# Patient Record
Sex: Female | Born: 1975 | Race: White | Hispanic: No | Marital: Single | Smoking: Former smoker
Health system: Southern US, Community
[De-identification: ages and names within clinical notes are randomized; demographics above are authoritative.]

## PROBLEM LIST (undated history)

## (undated) DIAGNOSIS — F319 Bipolar disorder, unspecified: Secondary | ICD-10-CM

## (undated) DIAGNOSIS — T7840XA Allergy, unspecified, initial encounter: Secondary | ICD-10-CM

## (undated) DIAGNOSIS — I341 Nonrheumatic mitral (valve) prolapse: Secondary | ICD-10-CM

## (undated) DIAGNOSIS — R569 Unspecified convulsions: Secondary | ICD-10-CM

## (undated) DIAGNOSIS — K219 Gastro-esophageal reflux disease without esophagitis: Secondary | ICD-10-CM

## (undated) DIAGNOSIS — L719 Rosacea, unspecified: Secondary | ICD-10-CM

## (undated) DIAGNOSIS — F419 Anxiety disorder, unspecified: Secondary | ICD-10-CM

## (undated) DIAGNOSIS — K859 Acute pancreatitis without necrosis or infection, unspecified: Secondary | ICD-10-CM

## (undated) DIAGNOSIS — K863 Pseudocyst of pancreas: Secondary | ICD-10-CM

## (undated) DIAGNOSIS — F1010 Alcohol abuse, uncomplicated: Secondary | ICD-10-CM

## (undated) DIAGNOSIS — I10 Essential (primary) hypertension: Secondary | ICD-10-CM

## (undated) HISTORY — DX: Allergy, unspecified, initial encounter: T78.40XA

## (undated) HISTORY — DX: Rosacea, unspecified: L71.9

## (undated) HISTORY — PX: LEEP: SHX91

## (undated) HISTORY — PX: NASAL SINUS SURGERY: SHX719

## (undated) HISTORY — DX: Anxiety disorder, unspecified: F41.9

## (undated) HISTORY — DX: Gastro-esophageal reflux disease without esophagitis: K21.9

## (undated) HISTORY — DX: Pseudocyst of pancreas: K86.3

---

## 2002-03-29 ENCOUNTER — Emergency Department (HOSPITAL_COMMUNITY): Admission: EM | Admit: 2002-03-29 | Discharge: 2002-03-29 | Payer: Self-pay | Admitting: Emergency Medicine

## 2002-11-08 ENCOUNTER — Encounter: Payer: Self-pay | Admitting: *Deleted

## 2002-11-08 ENCOUNTER — Inpatient Hospital Stay (HOSPITAL_COMMUNITY): Admission: AD | Admit: 2002-11-08 | Discharge: 2002-11-11 | Payer: Self-pay | Admitting: Family Medicine

## 2005-10-25 ENCOUNTER — Ambulatory Visit (HOSPITAL_COMMUNITY): Admission: RE | Admit: 2005-10-25 | Discharge: 2005-10-25 | Payer: Self-pay | Admitting: Internal Medicine

## 2005-11-22 ENCOUNTER — Ambulatory Visit (HOSPITAL_COMMUNITY): Admission: RE | Admit: 2005-11-22 | Discharge: 2005-11-22 | Payer: Self-pay | Admitting: Internal Medicine

## 2006-10-21 ENCOUNTER — Emergency Department (HOSPITAL_COMMUNITY): Admission: EM | Admit: 2006-10-21 | Discharge: 2006-10-21 | Payer: Self-pay | Admitting: Emergency Medicine

## 2007-05-26 ENCOUNTER — Emergency Department (HOSPITAL_COMMUNITY): Admission: EM | Admit: 2007-05-26 | Discharge: 2007-05-26 | Payer: Self-pay | Admitting: Emergency Medicine

## 2007-12-24 DIAGNOSIS — R569 Unspecified convulsions: Secondary | ICD-10-CM

## 2007-12-24 HISTORY — DX: Unspecified convulsions: R56.9

## 2008-08-24 ENCOUNTER — Ambulatory Visit (HOSPITAL_COMMUNITY): Admission: RE | Admit: 2008-08-24 | Discharge: 2008-08-24 | Payer: Self-pay | Admitting: Internal Medicine

## 2011-01-13 ENCOUNTER — Encounter: Payer: Self-pay | Admitting: Internal Medicine

## 2011-01-13 ENCOUNTER — Encounter: Payer: Self-pay | Admitting: Family Medicine

## 2011-05-10 NOTE — H&P (Signed)
NAME:  Brittany [name], Brittany [name]                      ACCOUNT NO.:  [id number]   MEDICAL RECORD NO.:  [id number]                   PATIENT TYPE:  INP   LOCATION:  A320                                 FACILITY:  [facility]   PHYSICIAN:  Gracelyn Nurse, M.D.              DATE OF BIRTH:  06-03-1976   DATE OF ADMISSION:  11/08/2002  DATE OF DISCHARGE:                                HISTORY & PHYSICAL   CHIEF COMPLAINT:  Headache and generalized weakness.   HISTORY OF PRESENT ILLNESS:  This is a 35 year old white female who  presented to her primary M.D.'s office earlier today, complaining of  headache, photophobia and generalized weakness.  She was sent to the  hospital for admission.  She has had a three-day history of sudden onset,  generalized myalgia, fever, chills and headache.  She has been feeling worse  despite using over-the-counter medications.  Her headache is even worse, and  she feels too weak to get out of the bed.  She has had some nausea and  vomiting x1 today.   PAST MEDICAL HISTORY:  Migraine headaches.   ALLERGIES:  No known drug allergies.   CURRENT MEDICATIONS:  Tylenol Cold p.r.n.   SOCIAL HISTORY:  Does not smoke any tobacco; drinks alcohol occasionally.  She is single with three children.   FAMILY HISTORY:  Mother and sister both have migraine headaches.  Father,  she does not know anything about.   REVIEW OF SYSTEMS:  As per HPI.  All other systems reviewed and are normal.   PHYSICAL EXAMINATION:  VITAL SIGNS:  Temperature 101.8, pulse 91,  respirations 20, blood pressure 109/66.  GENERAL:  This is a well-nourished white female who appears sick.  HEENT:  Pupils equal, round and reactive to light.  The extraocular  movements are intact.  Oral mucosa is moist.  Oropharynx is clear.  NECK:  Supple, full range-of-motion.  Able to touch chin to chest and no  lymphadenopathy.  CARDIOVASCULAR:  Regular rate and rhythm, no murmurs.  LUNGS:  Clear to auscultation.  ABDOMEN:  Soft, nontender, nondistended, bowel sounds are positive.  EXTREMITIES:  No edema.  NEUROLOGIC:  Cranial nerves II-XII are grossly intact.  There is no  Brudzinski or Kernig sign.  She has full range-of-motion of her neck.  SKIN:  Moist with no rash.   LABORATORY DATA:  CBC, __________ , and UA are currently pending.    ASSESSMENT AND PLAN:  1. Viral syndrome.  Suspect this is influenza virus.  I did not see any     meningeal signs at this point, so I will not do a lumbar puncture.  I     will give supportive care with IV fluids and emetics, antipyretics and     pain medications.  I will also check influenza nasal swab.  It has been     greater than 48 hours since onset of symptoms, so most of the anti-viral  medications for flu would not be effective if started at this late date.  2. Fever.  I will treat this with acetaminophen and ibuprofen.  3. Headache.  Patient does have a history of migraine headaches, which may     be exacerbated by this illness.  I will continue pain medicines.  I will     try a combination of Compazine and NSAID  which may be helpful.  Will try     using narcotic medications, if necessary.                                               Gracelyn Nurse, M.D.    JDJ/MEDQ  D:  11/08/2002  T:  11/08/2002  Job:  [id number]

## 2011-05-10 NOTE — Procedures (Signed)
[name], Brittany [name] NO.:  [id number]   MEDICAL RECORD NO.:  [id number]          PATIENT TYPE:  OUT   LOCATION:  RAD                           FACILITY:  [facility]   PHYSICIAN:  Dani Gobble, MD       DATE OF BIRTH:  1976-12-19   DATE OF PROCEDURE:  10/25/2005  DATE OF DISCHARGE:  10/25/2005                                  ECHOCARDIOGRAM   INDICATIONS:  A 35 year old female without prior cardiac history referred  for evaluation of possible mitral valve prolapse with chest pain and  palpitations.   Required study is reasonable.   Aorta measures normal at 2.7 cm.   Left atrium also measured normally at 3.2 cm.  No obvious clots or masses  were appreciated.  The patient appeared to be in sinus rhythm during this  procedure.   Intraventricular site and posterior wall were normal and measured at 1.0 cm  each.   The aortic valve was not well visualized but appeared to be grossly normal.  Although, all three leaflets were not well visualized, there are no  secondary sequelae such as aortic insufficiency or eccentric closure to  suggest otherwise.  No significant aortic insufficiency is proceeded.  Doppler interrogation aortic valve is within normal limits.   Mitral valve essentially appears normal, although, it exhibits mild full  atrophy anterior leaflet.  No significant mitral regurgitation is noted.  Doppler interrogation and mitral valve is within normal limits.   Pulmonic valve appears grossly structurally normal.  Tricuspid valve also  appeared grossly normal.  No mild tricuspid regurgitation noted.  Pulmonary  pressure is estimated to be at the upper limits of normal.   The left ventricle is normal in size with the LB IDD measuring 4.1 cm.  LB  ISD measured 2.7 cm.  Overall, left ventricular systolic function was  normal.  No regional wall motion abnormalities were noted.  There was no  suggestion of diastolic dysfunction on this.   The right atrium and  right ventricle were normal in size and right  ventricular systolic function appears to be normal.   IMPRESSION:  1.  Normal left ventricular size and systolic function without regional wall      motion abnormality noted.  2.  Mild prolapse with anterior leads for the mitral valve.  3.  Mild tricuspid regurgitation.  4.  Pulmonary function was estimated to be at the upper limits of normal.  5.  The aortic valve was not well visualized.  However, no secondary      sequelae to suggest otherwise (no AI and no eccentric closure):      Consider transesophageal echocardiogram if enhanced visualization of the      aortic valve is required.           ______________________________  Dani Gobble, MD     AB/MEDQ  D:  10/28/2005  T:  10/29/2005  Job:  [id number]

## 2011-05-10 NOTE — Discharge Summary (Signed)
   NAME:  Brittany [name], Brittany [name]                      ACCOUNT NO.:  [id number]   MEDICAL RECORD NO.:  [id number]                   PATIENT TYPE:  INP   LOCATION:  A320                                 FACILITY:  [facility]   PHYSICIAN:  Scott A. Gerda Diss, M.D.               DATE OF BIRTH:  12/22/1976   DATE OF ADMISSION:  11/08/2002  DATE OF DISCHARGE:  11/11/2002                                 DISCHARGE SUMMARY   DISCHARGE DIAGNOSES:  1. Viral syndrome.  2. Severe headache secondary to #1.  3. Gastroenteritis secondary to #1.   HOSPITAL COURSE:  The patient was admitted having a headache, high fever,  and inability to keep anything down.  There was concern for bacteremia.  The  patient's neck was supple.  During hospitalization, influenza test, type A  and type B, was negative.  A white count was 7.4, hemoglobin 11.5.  Temperature curve gradually came down over the next couple days.  Blood  culture came back on the evening of November 19 as being negative.  On the  morning of November 20, her fever curve was down, her headache was improved  but not 100% well, her neck was supple.  It was felt that the patient was  stable for discharge.  She was discharged to home in good condition.  Instructed to take Vicodin one q.4h. p.r.n. headache, caution drowsiness,  and also she may use Bactroban apply b.i.d. inside of nose as directed.  Follow up in the office if further trouble.  Off work this week.  Call if  problems.                                               Scott A. Gerda Diss, M.D.    SAL/MEDQ  D:  11/11/2002  T:  11/11/2002  Job:  [id number]

## 2011-10-10 LAB — RAPID URINE DRUG SCREEN, HOSP PERFORMED
Amphetamines: NOT DETECTED
Barbiturates: POSITIVE — AB
Benzodiazepines: POSITIVE — AB
Opiates: NOT DETECTED
Tetrahydrocannabinol: NOT DETECTED

## 2011-10-10 LAB — CBC
HCT: 37
Hemoglobin: 12.6
MCHC: 34.2
MCV: 86.7
RBC: 4.26
RDW: 16.7 — ABNORMAL HIGH

## 2011-10-10 LAB — DIFFERENTIAL
Basophils Relative: 1
Eosinophils Absolute: 0.1
Eosinophils Relative: 3
Monocytes Absolute: 0.3
Monocytes Relative: 6
Neutro Abs: 2.1

## 2011-10-10 LAB — BASIC METABOLIC PANEL
BUN: 4 — ABNORMAL LOW
CO2: 26
Chloride: 101
Glucose, Bld: 97
Potassium: 2.8 — ABNORMAL LOW
Sodium: 138

## 2012-04-29 ENCOUNTER — Emergency Department (HOSPITAL_COMMUNITY)
Admission: EM | Admit: 2012-04-29 | Discharge: 2012-05-02 | Disposition: A | Discharge status: Home / Self Care | Payer: 59 | Source: Home / Self Care | Attending: Emergency Medicine | Admitting: Emergency Medicine

## 2012-04-29 ENCOUNTER — Emergency Department (HOSPITAL_COMMUNITY): Payer: 59

## 2012-04-29 ENCOUNTER — Encounter (HOSPITAL_COMMUNITY): Payer: Self-pay | Admitting: *Deleted

## 2012-04-29 DIAGNOSIS — S82839A Other fracture of upper and lower end of unspecified fibula, initial encounter for closed fracture: Secondary | ICD-10-CM

## 2012-04-29 DIAGNOSIS — S82899A Other fracture of unspecified lower leg, initial encounter for closed fracture: Secondary | ICD-10-CM | POA: Insufficient documentation

## 2012-04-29 DIAGNOSIS — W010XXA Fall on same level from slipping, tripping and stumbling without subsequent striking against object, initial encounter: Secondary | ICD-10-CM | POA: Insufficient documentation

## 2012-04-29 DIAGNOSIS — F1010 Alcohol abuse, uncomplicated: Secondary | ICD-10-CM | POA: Insufficient documentation

## 2012-04-29 DIAGNOSIS — Z79899 Other long term (current) drug therapy: Secondary | ICD-10-CM | POA: Insufficient documentation

## 2012-04-29 DIAGNOSIS — F10929 Alcohol use, unspecified with intoxication, unspecified: Secondary | ICD-10-CM

## 2012-04-29 DIAGNOSIS — F319 Bipolar disorder, unspecified: Secondary | ICD-10-CM | POA: Insufficient documentation

## 2012-04-29 DIAGNOSIS — R000 Tachycardia, unspecified: Secondary | ICD-10-CM | POA: Insufficient documentation

## 2012-04-29 HISTORY — DX: Bipolar disorder, unspecified: F31.9

## 2012-04-29 HISTORY — DX: Unspecified convulsions: R56.9

## 2012-04-29 LAB — COMPREHENSIVE METABOLIC PANEL
AST: 34 U/L (ref 0–37)
CO2: 30 mEq/L (ref 19–32)
Calcium: 9.1 mg/dL (ref 8.4–10.5)
Creatinine, Ser: 0.57 mg/dL (ref 0.50–1.10)
GFR calc Af Amer: >90 mL/min (ref >90)
GFR calc non Af Amer: >90 mL/min (ref >90)
Total Protein: 7.3 g/dL (ref 6.0–8.3)

## 2012-04-29 LAB — RAPID URINE DRUG SCREEN, HOSP PERFORMED
Cocaine: NOT DETECTED
Opiates: NOT DETECTED
Tetrahydrocannabinol: NOT DETECTED

## 2012-04-29 LAB — CBC
MCH: 26.1 pg (ref 26.0–34.0)
MCHC: 32.5 g/dL (ref 30.0–36.0)
MCV: 80.2 fL (ref 78.0–100.0)
Platelets: 464 10*3/uL — ABNORMAL HIGH (ref 150–400)
RBC: 4.64 MIL/uL (ref 3.87–5.11)

## 2012-04-29 NOTE — ED Provider Notes (Signed)
History     CSN: [id number]  Arrival date & time 04/29/12  7829   First MD Initiated Contact with Patient 04/29/12 2149      Chief Complaint  Patient presents with  . Injury    L foot  . Medical Clearance    for etoh detox   HPI  History provided by the patient. History is limited given alcohol intoxication. Level V applies. Patient is a 36 year old female with history of bipolar disorder and alcohol abuse who presents with requests for help with alcohol detox. Patient also complains of a left ankle injury yesterday that she obtained after slipping on a wet porch. Patient reports having increased swelling and pain today after walking on leg. She reports initially icing the leg with improvement of symptoms. She has not done anything else for her pain. She denies any other aggravating or alleviating factors. She denies any other injury. No head injury or headache. She denies any numbness or tingling in foot. Patient reports that she has been drinking heavily for the past week or more. She admits to drinking heavily today with last drink prior to arrival.    Past Medical History  Diagnosis Date  . Bipolar 1 disorder     starting to hear voices  . Seizures     stress induced    History reviewed. No pertinent past surgical history.  No family history on file.  History  Substance Use Topics  . Smoking status: Current Everyday Smoker    Types: Cigarettes  . Smokeless tobacco: Not on file  . Alcohol Use: Yes     24 ounces once q 2 weeks    OB History    Grav Para Term Preterm Abortions TAB SAB Ect Mult Living                  Review of Systems Level V applies secondary to intoxication    Allergies  Morphine and related  Home Medications   Current Outpatient Rx  Name Route Sig Dispense Refill  . ACETAMINOPHEN 500 MG PO TABS Oral Take 500 mg by mouth every 6 (six) hours as needed. For pain    . CLONAZEPAM 0.5 MG PO TABS Oral Take 0.5 mg by mouth 2 (two) times daily  as needed. For anxiety    . DULOXETINE HCL 20 MG PO CPEP Oral Take 20 mg by mouth daily.    Marland Kitchen LAMOTRIGINE 200 MG PO TABS Oral Take 200 mg by mouth daily.    Marland Kitchen LOPERAMIDE HCL 2 MG PO CAPS Oral Take 2 mg by mouth 4 (four) times daily as needed.    . TRAZODONE HCL 100 MG PO TABS Oral Take 100 mg by mouth 3 (three) times daily.      BP 144/112  Pulse 130  Temp(Src) 98.5 F (36.9 C) (Oral)  Resp 18  SpO2 98%  LMP 04/12/2012  Physical Exam  Nursing note and vitals reviewed. Constitutional: She is oriented to person, place, and time. She appears well-developed and well-nourished. No distress.  HENT:  Head: Normocephalic and atraumatic.       No battle sign or raccoon  Eyes: Conjunctivae and EOM are normal. Pupils are equal, round, and reactive to light.  Neck: Normal range of motion. Neck supple.  Cardiovascular: Regular rhythm.  Tachycardia present.   Pulmonary/Chest: Effort normal and breath sounds normal. No respiratory distress. She has no wheezes. She has no rales.  Abdominal: Soft. There is no tenderness.  Neurological: She is alert and  oriented to person, place, and time. She has normal strength. No cranial nerve deficit or sensory deficit.  Skin: Skin is warm and dry. No rash noted.  Psychiatric: She has a normal mood and affect. Her behavior is normal.    ED Course  Procedures  Results for orders placed during the hospital encounter of 04/29/12  CBC      Component Value Range   WBC 10.3  4.0 - 10.5 (K/uL)   RBC 4.64  3.87 - 5.11 (MIL/uL)   Hemoglobin 12.1  12.0 - 15.0 (g/dL)   HCT 16.1  09.6 - 04.5 (%)   MCV 80.2  78.0 - 100.0 (fL)   MCH 26.1  26.0 - 34.0 (pg)   MCHC 32.5  30.0 - 36.0 (g/dL)   RDW 40.9 (*) 81.1 - 15.5 (%)   Platelets 464 (*) 150 - 400 (K/uL)  COMPREHENSIVE METABOLIC PANEL      Component Value Range   Sodium 144  135 - 145 (mEq/L)   Potassium 3.5  3.5 - 5.1 (mEq/L)   Chloride 103  96 - 112 (mEq/L)   CO2 30  19 - 32 (mEq/L)   Glucose, Bld 102 (*)  70 - 99 (mg/dL)   BUN 4 (*) 6 - 23 (mg/dL)   Creatinine, Ser 9.14  0.50 - 1.10 (mg/dL)   Calcium 9.1  8.4 - 78.2 (mg/dL)   Total Protein 7.3  6.0 - 8.3 (g/dL)   Albumin 3.6  3.5 - 5.2 (g/dL)   AST 34  0 - 37 (U/L)   ALT 27  0 - 35 (U/L)   Alkaline Phosphatase 108  39 - 117 (U/L)   Total Bilirubin 0.2 (*) 0.3 - 1.2 (mg/dL)   GFR calc non Af Amer >90  >90 (mL/min)   GFR calc Af Amer >90  >90 (mL/min)  ETHANOL      Component Value Range   Alcohol, Ethyl (B) 379 (*) 0 - 11 (mg/dL)  URINE RAPID DRUG SCREEN (HOSP PERFORMED)      Component Value Range   Opiates NONE DETECTED  NONE DETECTED    Cocaine NONE DETECTED  NONE DETECTED    Benzodiazepines NONE DETECTED  NONE DETECTED    Amphetamines NONE DETECTED  NONE DETECTED    Tetrahydrocannabinol NONE DETECTED  NONE DETECTED    Barbiturates NONE DETECTED  NONE DETECTED   POCT PREGNANCY, URINE      Component Value Range   Preg Test, Ur NEGATIVE  NEGATIVE       Dg Ankle Complete Left  04/29/2012  *RADIOLOGY REPORT*  Clinical Data: Fall.  Ankle swelling.  LEFT ANKLE COMPLETE - 3+ VIEW  Comparison: None.  Findings: There is an oblique distal fibular fracture extending from the metaphysis into the lateral malleolus.  There is widening of the ankle mortise.  The talar dome appears intact.  Medial malleolus and posterior malleolus appear intact.  Ankle effusion and diffuse soft tissue swelling is present about the ankle.  The distal fibular fracture is posteriorly and laterally displaced about one cortex width.  IMPRESSION: Oblique distal fibular fracture with widening of the ankle mortise.  Original Report Authenticated By: Andreas Newport, M.D.   Dg Foot Complete Left  04/29/2012  *RADIOLOGY REPORT*  Clinical Data: Left foot and ankle pain.  Swelling.  LEFT FOOT - COMPLETE 3+ VIEW  Comparison: None.  Findings: Mild first MTP joint osteoarthritis.  The forefoot alignment appears normal.  Lateral malleolar fractures again noted. The displacement  appears more pronounced on these  radiographs associated with obliquity.  Diffuse soft tissue swelling is present in the foot.  IMPRESSION: Lateral malleolar fracture.  Forefoot intact.  Mild first MTP joint osteoarthritis.  Original Report Authenticated By: Andreas Newport, M.D.     1. Fracture of distal fibula   2. Alcohol abuse   3. Alcohol intoxication       MDM  10:15 PM patient seen and evaluated. Patient no acute distress. Patient obviously intoxicated and poor historian.  Spoke with act team they will see patient and evaluate for possible placement but we'll wait for alcohol level to come down.  Patient placed in Cam Walker and given ice pack for her ankle.   Psych holding orders in place.   Angus Seller, [place] 04/30/12 [phone]

## 2012-04-29 NOTE — ED Notes (Signed)
Left ankle swelling and redness. Cap refill less than 2 seconds. She can wiggle digits. Sensation present.

## 2012-04-29 NOTE — ED Notes (Signed)
Pt is here for etoh detox.  Pt states she drinks every other week.  Last drank today (a 24).  Children have been taken away from her.  Pt also states she slipped on a mat and fell backward.  L foot swollen and bruised.  Denies SI, HI or depression.

## 2012-04-29 NOTE — ED Notes (Addendum)
Pt. Refused CT scan Ivonne Andrew, PA notified,

## 2012-04-29 NOTE — ED Notes (Signed)
Received pt. From stretcher triage, pt. Alert and oriented, left ankle swelling noted,

## 2012-04-30 LAB — ETHANOL: Alcohol, Ethyl (B): 195 mg/dL — ABNORMAL HIGH (ref 0–11)

## 2012-04-30 MED ORDER — TRAZODONE HCL 100 MG PO TABS
100.0000 mg | ORAL_TABLET | Freq: Every day | ORAL | Status: DC
  Administered 2012-05-02: 100 mg via ORAL
  Filled 2012-04-30: qty 1

## 2012-04-30 MED ORDER — IBUPROFEN 200 MG PO TABS
600.0000 mg | ORAL_TABLET | Freq: Three times a day (TID) | ORAL | Status: DC | PRN
  Administered 2012-04-30 – 2012-05-02 (×3): 600 mg via ORAL
  Filled 2012-04-30 (×3): qty 3

## 2012-04-30 MED ORDER — LORAZEPAM 1 MG PO TABS
1.0000 mg | ORAL_TABLET | Freq: Three times a day (TID) | ORAL | Status: DC | PRN
  Administered 2012-04-30 – 2012-05-02 (×4): 1 mg via ORAL
  Filled 2012-04-30 (×3): qty 1

## 2012-04-30 MED ORDER — IBUPROFEN 800 MG PO TABS
800.0000 mg | ORAL_TABLET | Freq: Once | ORAL | Status: AC
  Administered 2012-04-30: 800 mg via ORAL
  Filled 2012-04-30: qty 1

## 2012-04-30 MED ORDER — LAMOTRIGINE 200 MG PO TABS
200.0000 mg | ORAL_TABLET | Freq: Every day | ORAL | Status: DC
  Administered 2012-04-30 – 2012-05-02 (×3): 200 mg via ORAL
  Filled 2012-04-30 (×3): qty 1

## 2012-04-30 MED ORDER — ZOLPIDEM TARTRATE 5 MG PO TABS
5.0000 mg | ORAL_TABLET | Freq: Every evening | ORAL | Status: DC | PRN
  Administered 2012-04-30: 5 mg via ORAL
  Filled 2012-04-30: qty 1

## 2012-04-30 MED ORDER — NICOTINE 21 MG/24HR TD PT24
21.0000 mg | MEDICATED_PATCH | Freq: Every day | TRANSDERMAL | Status: DC
  Administered 2012-04-30 – 2012-05-01 (×2): 21 mg via TRANSDERMAL
  Filled 2012-04-30 (×3): qty 1

## 2012-04-30 MED ORDER — ONDANSETRON HCL 8 MG PO TABS
4.0000 mg | ORAL_TABLET | Freq: Three times a day (TID) | ORAL | Status: DC | PRN
  Administered 2012-04-30: 4 mg via ORAL
  Filled 2012-04-30: qty 1

## 2012-04-30 MED ORDER — TRAZODONE HCL 100 MG PO TABS
100.0000 mg | ORAL_TABLET | Freq: Three times a day (TID) | ORAL | Status: DC
  Administered 2012-04-30: 100 mg via ORAL
  Filled 2012-04-30 (×2): qty 1

## 2012-04-30 MED ORDER — DULOXETINE HCL 20 MG PO CPEP
20.0000 mg | ORAL_CAPSULE | Freq: Every day | ORAL | Status: DC
  Administered 2012-04-30 – 2012-05-02 (×3): 20 mg via ORAL
  Filled 2012-04-30 (×3): qty 1

## 2012-04-30 MED ORDER — ALUM & MAG HYDROXIDE-SIMETH 200-200-20 MG/5ML PO SUSP: 30.0000 mL | ORAL | Status: DC | PRN

## 2012-04-30 NOTE — ED Notes (Signed)
Patient got tray

## 2012-04-30 NOTE — ED Notes (Signed)
Pt reports her left foot, which is is a cam walker, was feeling slightly numb, loosened cam walker - foot mildly swollen, good CMS, pt reports improvement with loosened boot

## 2012-04-30 NOTE — ED Provider Notes (Signed)
Medical screening examination/treatment/procedure(s) were performed by non-physician practitioner and as supervising physician I was immediately available for consultation/collaboration.   Dondi Burandt L Laquida Cotrell, MD 04/30/12 1538 
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
## 2012-05-01 ENCOUNTER — Ambulatory Visit (HOSPITAL_COMMUNITY)
Admission: EM | Admit: 2012-05-01 | Discharge: 2012-05-01 | Disposition: A | Discharge status: Home / Self Care | Payer: Medicaid Other | Source: Ambulatory Visit | Attending: Psychiatry | Admitting: Psychiatry

## 2012-05-01 ENCOUNTER — Encounter (HOSPITAL_COMMUNITY): Payer: Self-pay | Admitting: *Deleted

## 2012-05-01 MED ORDER — CLONIDINE HCL 0.1 MG PO TABS
0.2000 mg | ORAL_TABLET | Freq: Once | ORAL | Status: AC
  Administered 2012-05-01: 0.2 mg via ORAL
  Filled 2012-05-01: qty 2

## 2012-05-01 MED ORDER — LAMOTRIGINE 200 MG PO TABS: 200.0000 mg | ORAL_TABLET | Freq: Every day | ORAL | Status: DC

## 2012-05-01 MED ORDER — VITAMIN B-1 100 MG PO TABS
100.0000 mg | ORAL_TABLET | Freq: Every day | ORAL | Status: DC
  Administered 2012-05-01 – 2012-05-02 (×2): 100 mg via ORAL
  Filled 2012-05-01 (×2): qty 1

## 2012-05-01 MED ORDER — LORAZEPAM 1 MG PO TABS
1.0000 mg | ORAL_TABLET | Freq: Four times a day (QID) | ORAL | Status: DC | PRN
  Administered 2012-05-01: 1 mg via ORAL
  Filled 2012-05-01 (×2): qty 1

## 2012-05-01 MED ORDER — FOLIC ACID 1 MG PO TABS
1.0000 mg | ORAL_TABLET | Freq: Every day | ORAL | Status: DC
  Administered 2012-05-01 – 2012-05-02 (×2): 1 mg via ORAL
  Filled 2012-05-01 (×4): qty 1

## 2012-05-01 MED ORDER — ADULT MULTIVITAMIN W/MINERALS CH
1.0000 | ORAL_TABLET | Freq: Every day | ORAL | Status: DC
  Administered 2012-05-01 – 2012-05-02 (×2): 1 via ORAL
  Filled 2012-05-01 (×2): qty 1

## 2012-05-01 MED ORDER — DULOXETINE HCL 20 MG PO CPEP: 20.0000 mg | ORAL_CAPSULE | Freq: Every day | ORAL | Status: DC

## 2012-05-01 MED ORDER — THIAMINE HCL 100 MG/ML IJ SOLN: 100.0000 mg | Freq: Every day | INTRAMUSCULAR | Status: DC

## 2012-05-01 MED ORDER — CIPROFLOXACIN HCL 500 MG PO TABS
500.0000 mg | ORAL_TABLET | Freq: Two times a day (BID) | ORAL | Status: DC
  Filled 2012-05-01 (×4): qty 1

## 2012-05-01 MED ORDER — LORAZEPAM 2 MG/ML IJ SOLN: 1.0000 mg | Freq: Four times a day (QID) | INTRAMUSCULAR | Status: DC | PRN

## 2012-05-01 NOTE — ED Provider Notes (Signed)
Patient is resting comfortably alert cooperative awaiting placement for alcohol detox  Doug Sou, MD 05/01/12 [phone]

## 2012-05-01 NOTE — ED Notes (Signed)
Pt mentions ankle discomfort/aching, also shaky and bothered by things she heard/saw on TV (non-specific), channel changed, CIWA scored, meds given for shakiness/restlessness & ankle pain. Resting in dark with TV on, breakfaast tray pending, given crackers. Alert, NAD, unable to sleep, polite, cooperative.
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
## 2012-05-01 NOTE — ED Notes (Signed)
Pt st's she does not eat fish which was what she was given for lunch.  Pt given [place] Sandwich.

## 2012-05-01 NOTE — ED Notes (Signed)
Called for pt's lunch tray 

## 2012-05-01 NOTE — ED Notes (Signed)
Report received from B.B., RN.

## 2012-05-02 ENCOUNTER — Encounter (HOSPITAL_COMMUNITY): Payer: Self-pay | Admitting: *Deleted

## 2012-05-02 ENCOUNTER — Inpatient Hospital Stay (HOSPITAL_COMMUNITY)
Admission: EM | Admit: 2012-05-02 | Discharge: 2012-05-05 | DRG: 897 | Disposition: A | Discharge status: Home / Self Care | Payer: 59 | Source: Intra-hospital | Attending: Psychiatry | Admitting: Psychiatry

## 2012-05-02 DIAGNOSIS — F1020 Alcohol dependence, uncomplicated: Principal | ICD-10-CM | POA: Diagnosis present

## 2012-05-02 DIAGNOSIS — M19079 Primary osteoarthritis, unspecified ankle and foot: Secondary | ICD-10-CM | POA: Diagnosis present

## 2012-05-02 DIAGNOSIS — F39 Unspecified mood [affective] disorder: Secondary | ICD-10-CM | POA: Diagnosis present

## 2012-05-02 DIAGNOSIS — F1010 Alcohol abuse, uncomplicated: Secondary | ICD-10-CM | POA: Diagnosis present

## 2012-05-02 DIAGNOSIS — Z4789 Encounter for other orthopedic aftercare: Secondary | ICD-10-CM

## 2012-05-02 DIAGNOSIS — F17200 Nicotine dependence, unspecified, uncomplicated: Secondary | ICD-10-CM | POA: Diagnosis present

## 2012-05-02 DIAGNOSIS — S82892A Other fracture of left lower leg, initial encounter for closed fracture: Secondary | ICD-10-CM | POA: Diagnosis present

## 2012-05-02 MED ORDER — ALUM & MAG HYDROXIDE-SIMETH 200-200-20 MG/5ML PO SUSP: 30.0000 mL | ORAL | Status: DC | PRN

## 2012-05-02 MED ORDER — TRAZODONE HCL 50 MG PO TABS: 50.0000 mg | ORAL_TABLET | Freq: Every evening | ORAL | Status: DC | PRN

## 2012-05-02 MED ORDER — ACETAMINOPHEN 325 MG PO TABS
650.0000 mg | ORAL_TABLET | Freq: Four times a day (QID) | ORAL | Status: DC | PRN
  Administered 2012-05-03: 650 mg via ORAL

## 2012-05-02 MED ORDER — LOPERAMIDE HCL 2 MG PO CAPS: 2.0000 mg | ORAL_CAPSULE | ORAL | Status: DC | PRN

## 2012-05-02 MED ORDER — MAGNESIUM HYDROXIDE 400 MG/5ML PO SUSP: 30.0000 mL | Freq: Every day | ORAL | Status: DC | PRN

## 2012-05-02 MED ORDER — DULOXETINE HCL 60 MG PO CPEP
60.0000 mg | ORAL_CAPSULE | Freq: Every day | ORAL | Status: DC
  Administered 2012-05-03 – 2012-05-05 (×3): 60 mg via ORAL
  Filled 2012-05-02 (×2): qty 1
  Filled 2012-05-02 (×2): qty 14
  Filled 2012-05-02 (×2): qty 1

## 2012-05-02 MED ORDER — LAMOTRIGINE 100 MG PO TABS
200.0000 mg | ORAL_TABLET | Freq: Every day | ORAL | Status: DC
  Administered 2012-05-02 – 2012-05-05 (×4): 200 mg via ORAL
  Filled 2012-05-02: qty 1
  Filled 2012-05-02 (×2): qty 2
  Filled 2012-05-02 (×2): qty 28
  Filled 2012-05-02 (×3): qty 1

## 2012-05-02 MED ORDER — LOPERAMIDE HCL 2 MG PO CAPS: 2.0000 mg | ORAL_CAPSULE | Freq: Four times a day (QID) | ORAL | Status: DC | PRN

## 2012-05-02 MED ORDER — ADULT MULTIVITAMIN W/MINERALS CH
1.0000 | ORAL_TABLET | Freq: Every day | ORAL | Status: DC
  Administered 2012-05-02 – 2012-05-05 (×4): 1 via ORAL
  Filled 2012-05-02 (×6): qty 1

## 2012-05-02 MED ORDER — IBUPROFEN 800 MG PO TABS
800.0000 mg | ORAL_TABLET | Freq: Three times a day (TID) | ORAL | Status: DC | PRN
  Administered 2012-05-02 – 2012-05-05 (×6): 800 mg via ORAL
  Filled 2012-05-02 (×6): qty 1

## 2012-05-02 MED ORDER — TRAZODONE HCL 100 MG PO TABS
200.0000 mg | ORAL_TABLET | Freq: Every day | ORAL | Status: DC
  Administered 2012-05-02: 100 mg via ORAL
  Administered 2012-05-02 – 2012-05-04 (×3): 200 mg via ORAL
  Filled 2012-05-02 (×2): qty 2
  Filled 2012-05-02: qty 1
  Filled 2012-05-02: qty 28
  Filled 2012-05-02: qty 1
  Filled 2012-05-02: qty 2
  Filled 2012-05-02: qty 28

## 2012-05-02 MED ORDER — HYDROXYZINE HCL 25 MG PO TABS
25.0000 mg | ORAL_TABLET | Freq: Four times a day (QID) | ORAL | Status: DC | PRN
  Administered 2012-05-03 – 2012-05-05 (×4): 25 mg via ORAL
  Filled 2012-05-02: qty 1

## 2012-05-02 MED ORDER — CHLORDIAZEPOXIDE HCL 25 MG PO CAPS
25.0000 mg | ORAL_CAPSULE | Freq: Four times a day (QID) | ORAL | Status: DC | PRN
  Administered 2012-05-02 – 2012-05-03 (×2): 25 mg via ORAL
  Filled 2012-05-02 (×2): qty 1

## 2012-05-02 MED ORDER — DIPHENHYDRAMINE HCL 25 MG PO CAPS
25.0000 mg | ORAL_CAPSULE | Freq: Once | ORAL | Status: AC
  Administered 2012-05-02: 25 mg via ORAL
  Filled 2012-05-02: qty 1

## 2012-05-02 MED ORDER — THIAMINE HCL 100 MG/ML IJ SOLN
100.0000 mg | Freq: Once | INTRAMUSCULAR | Status: AC
  Administered 2012-05-02: 100 mg via INTRAMUSCULAR

## 2012-05-02 MED ORDER — ACETAMINOPHEN 325 MG PO TABS: 650.0000 mg | ORAL_TABLET | Freq: Four times a day (QID) | ORAL | Status: DC | PRN

## 2012-05-02 MED ORDER — VITAMIN B-1 100 MG PO TABS
100.0000 mg | ORAL_TABLET | Freq: Every day | ORAL | Status: DC
  Administered 2012-05-03 – 2012-05-05 (×3): 100 mg via ORAL
  Filled 2012-05-02 (×5): qty 1

## 2012-05-02 MED ORDER — ONDANSETRON 4 MG PO TBDP: 4.0000 mg | ORAL_TABLET | Freq: Four times a day (QID) | ORAL | Status: DC | PRN

## 2012-05-02 NOTE — Progress Notes (Signed)
Pt pleasant on approach, some swelling to her left foot, has fractured tibia.  Elevated Pt's foot with pillow.  Pt states some pain to her left leg where fracture is, given Ibuprofen as ordered for this.  Sitting on bed reading, no other complaints voiced.  Pt denies SI/HI/hallucinations at this time.  Interacting appropriately on unit, positive for group.  Support and encouragement offered, will continue to monitor.

## 2012-05-02 NOTE — BH Assessment (Signed)
Clinician was notified that the patient's BP had stabilized.  Called [facility] to inform assessment staff.  Was notified by Patsy Lager, that the on call psychiatrist wants the patient to be re run in the morning.  ACT team will follow up with the disposition.

## 2012-05-02 NOTE — Discharge Instructions (Signed)
Please go to behavioral health for further evaluation and treatment

## 2012-05-02 NOTE — Tx Team (Signed)
Initial Interdisciplinary Treatment Plan  PATIENT STRENGTHS: (choose at least two) Average or above average intelligence Capable of independent living Supportive family/friends  PATIENT STRESSORS: Legal issue Marital or family conflict Occupational concerns Substance abuse   PROBLEM LIST: Problem List/Patient Goals Date to be addressed Date deferred Reason deferred Estimated date of resolution  etoh dependence 05-02-12           Loss custody of children  05-02-12                                          DISCHARGE CRITERIA:  Improved stabilization in mood, thinking, and/or behavior Motivation to continue treatment in a less acute level of care Withdrawal symptoms are absent or subacute and managed without 24-hour nursing intervention  PRELIMINARY DISCHARGE PLAN: Attend 12-step recovery group Return to previous living arrangement Return to previous work or school arrangements  PATIENT/FAMIILY INVOLVEMENT: This treatment plan has been presented to and reviewed with the patient, ROBEN TATSCH, and/or family member, .  The patient and family have been given the opportunity to ask questions and make suggestions.  Valente David 05/02/2012, 6:37 PM

## 2012-05-02 NOTE — ED Notes (Signed)
Ordered pt a lunch tray 

## 2012-05-02 NOTE — Progress Notes (Signed)
Patient ID: Brittany [name], female   DOB: [birth year], 36 y.o.   MRN: [id number] 05-02-12 nursing adm note: pt came to bh voluntarily wanting detox from etoh. She stated she drinks about a 6 pack 4 times per week. Her last drink was 04-28-12. She was not having any overt withdrawal symptoms on admission.  Her etoh level at the ed was 379 three days ago. She is a smoker, allergic to morphine and has an intolerance to vicoprofen. Her pain level in her left leg is 7/10. She has on a boot and has a tibial fracture. She has a hx of bipolar d/o and has had auditory hallucination in the past. She has 4 children that were taken from the home by dss. She denies any si/hi/av. She stated she is moderately agoraphobic and wears reading glasses. She has a med hx of a mitral valve prolapse, rn took her birth control and put in the 300 hall med room. She was polite/cooperative on adm and escorted to the 300 hall.   Emergency contact: rebecca strader-friend at ph # [phone]

## 2012-05-02 NOTE — Progress Notes (Signed)
Patient ID: Brittany [name], female   DOB: 01/31/1976, 36 y.o.   MRN: [id number]  [facility] Group Notes:  (Counselor/Nursing/MHT/Case Management/Adjunct)  05/02/2012 1:15 PM  Type of Therapy:  Group Therapy, Dance/Movement Therapy   Participation Level:  Did Not Attend   Rhunette Croft
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
## 2012-05-02 NOTE — ED Notes (Signed)
Patient with itching after nicotine patch being placed.  Patched removed, new orders per Dr Norlene Campbell.

## 2012-05-03 DIAGNOSIS — F1010 Alcohol abuse, uncomplicated: Secondary | ICD-10-CM

## 2012-05-03 DIAGNOSIS — S82892A Other fracture of left lower leg, initial encounter for closed fracture: Secondary | ICD-10-CM | POA: Diagnosis present

## 2012-05-03 NOTE — H&P (Signed)
  Pt was seen by me today and I agree with the key elements documented in H&P.  

## 2012-05-03 NOTE — H&P (Signed)
Psychiatric Admission Assessment Adult  Patient Identification:  Brittany [name] Date of Evaluation:  05/03/2012 36yo SWF CC: Inoxicated needs detox  History of Present Illness: Larey Seat 5/7but didn't come to [facility] ED until 5/8. Initial ETOH was 379 and she was noted to have L lateral malleolar fracture. She was put in a walking boot and detoxed without incident allowing for transfer to [facility] late Saturday 5/11. She denies prior formal detox but has had alcohol abuse issues for a number of years. Denies DT's or withdrawal seizures but does acknowledge blackouts.   DSS removed her children Monday May 6th and she is asking to go to court Tuesday May 14th regarding her regaining custody son 2 daughters 14,12 & 4. Has another court appearance on Thursday May 16th -her mother took out a 68 B.       Past Psychiatric History: Reports a diagnosis of bipolar 2006 or so currently receives med management [facility] and gets Rx from [facility].  Substance Abuse History:  Social History:    reports that she has been smoking Cigarettes.  She has smoked for the past 10 years. She does not have any smokeless tobacco history on file. She reports that she drinks about 14.4 ounces of alcohol per week. She reports that she does not use illicit drugs. Went to 10 th grade never married not employed.   Family Psych History: N/A Past Medical History:     Past Medical History  Diagnosis Date  . Bipolar 1 disorder     starting to hear voices  . Seizures     stress induced  Shakes when stressed - no loss of consciousness no epileptic meds but does take lamictal for mood     History reviewed. No pertinent past surgical history.  Allergies:  Allergies  Allergen Reactions  . Vicoprofen (Hydrocodone-Ibuprofen) Nausea Only  . Morphine And Related     Itching     Current Medications:  Prior to Admission medications   Medication Sig Start Date End Date Taking?  Authorizing Provider  lamoTRIgine (LAMICTAL) 200 MG tablet Take 200 mg by mouth daily.   Yes Historical Provider, MD  loperamide (IMODIUM) 2 MG capsule Take 2 mg by mouth 4 (four) times daily as needed.    Historical Provider, MD    Mental Status Examination/Evaluation: Objective:  Appearance: Fairly Groomed  Psychomotor Activity:  Decreased walking boot L foot   Eye Contact::  Good  Speech:  Normal Rate  Volume:  Normal  Mood: appropriate to situation    Affect:  Appropriate  Thought Process: clear rational goal oriented - wants to make court on Tuesday    Orientation:  Full  Thought Content:  No AVH/psychosis   Suicidal Thoughts:  No  Homicidal Thoughts:  No  Judgement:  Impaired  Insight:  Shallow    DIAGNOSIS:    AXIS I Substance Abuse and Substance Induced Mood Disorder  AXIS II Deferred  AXIS III See medical history.  AXIS IV economic problems, educational problems, housing problems, occupational problems, other psychosocial or environmental problems, problems related to legal system/crime, problems related to social environment and problems with primary support group  AXIS V 11-20 some danger of hurting self or others possible OR occasionally fails to maintain minimal personal hygiene OR gross impairment in communication     Treatment Plan Summary: Admit for further stabilization and treatment  Needs longer term SA treatment  DSS has her 4 children in foster care.

## 2012-05-03 NOTE — Progress Notes (Signed)
Pt pleasant, sitting in dayroom laughing with peers.  Positive for group this evening.  Denies complaints.  Does state that her left tibial fracture remains no lower than a 5 on the pain scale, receiving Ibuprofen 800 mg for this.  Medication given as ordered.  Support and encouragement offered, will continue to monitor.

## 2012-05-03 NOTE — Progress Notes (Signed)
Patient ID: Brittany [name], female   DOB: April 02, 1976, 36 y.o.   MRN: [id number] 05-03-12 nursing shift note: pt was given prn's for anxiety and a headache earlier in the shift. On her inventory sheet she wrote she slept fair, appetite good, energy normal, attention good with depression and hopelessness rated at 3 and 2 respectively. No w/d symptoms mentioned. Denied any suicidal or self harm thoughts. Physical problems in the last 24 hrs have been some ankle pain and rated it at 5, goal is 2. Changes she plans to make when she gets home is to "not drink and concentrate on my life and kids". She does want to take with a counselor about her court dated. She stated " i have to go to talk about what's offered. She stated she doesn't see problems staying on her medications after discharge. rn will monitor and q 15 min cks continue.

## 2012-05-03 NOTE — Progress Notes (Signed)
Patient ID: Brittany [name], female   DOB: 05/18/1976, 36 y.o.   MRN: [id number] Pt. attended and participated in aftercare planning group. Pt. accepted information on suicide prevention, warning signs to look for with suicide and crisis line numbers to use. The pt. agreed to call crisis line numbers if having warning signs or having thoughts of suicide. Pt. listed their current anxiety level as a 3 and depression as a 5 on a scale of  to 10 with being the high.  Pt. accepted an NA and AA meeting schedule.
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
## 2012-05-03 NOTE — Progress Notes (Signed)
Patient ID: TAEJAH OHALLORAN, female   DOB: Aug 25, 1976, 36 y.o.   MRN: [id number]  [facility] Group Notes:  (Counselor/Nursing/MHT/Case Management/Adjunct)  05/03/2012 1:15 PM  Type of Therapy:  Group Therapy, Dance/Movement Therapy   Participation Level:  Minimal  Participation Quality:  Appropriate  Affect:  Appropriate  Cognitive:  Appropriate  Insight:  Limited  Engagement in Group:  Limited  Engagement in Therapy:  Limited  Modes of Intervention:  Clarification, Problem-solving, Role-play, Socialization and Support  Summary of Progress/Problems:  Therapist discussed the meaning of supports and asked group what does support mean to them.  Group discussed the difference between positive and negative support. Pt. stated that "supports are there to help you when you can't help yourself".  Pt. is aware of supports systems; however, she needs assistance on knowing when to utilize them.    Rhunette Croft
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
## 2012-05-04 MED ORDER — DULOXETINE HCL 60 MG PO CPEP: 60.0000 mg | ORAL_CAPSULE | Freq: Every day | ORAL | Status: DC

## 2012-05-04 MED ORDER — TRAZODONE HCL 100 MG PO TABS: 200.0000 mg | ORAL_TABLET | Freq: Every day | ORAL | Status: DC

## 2012-05-04 MED ORDER — LAMOTRIGINE 200 MG PO TABS: 200.0000 mg | ORAL_TABLET | Freq: Every day | ORAL | Status: DC

## 2012-05-04 NOTE — Progress Notes (Signed)
[facility] Group Notes:  (Counselor/Nursing/MHT/Case Management/Adjunct)  05/04/2012 12:16 PM  Type of Therapy:  Group Therapy at 11  Participation Level:  Active  Participation Quality:  Appropriate, Attentive and Sharing  Affect:  Appropriate  Cognitive:  Appropriate  Insight:  Limited  Engagement in Group:  Good  Engagement in Therapy:  Good  Modes of Intervention:  Clarification, Problem-solving, Socialization and Support  Summary of Progress/Problems:  Brittany [name] shared that one of her biggest obstacles to success at discharges will be expectations/lack of trust from family,  and burden of responsibility for her outcomes.   Brittany [name] reports she feels inadequate to receive trust from family that things will be different, yet also knows she needs some trust in order to begin to trust herself.  Patient was open to idea of expanding her support group to include others with more experience in recovery.     [name] 05/04/2012, 12:27 PM  [facility] Group Notes:  (Counselor/Nursing/MHT/Case Management/Adjunct)  05/04/2012 2:09 PM  Type of Therapy:  Group Therapy  Participation Level:  Active  Participation Quality:  Appropriate  Affect:  Appropriate  Cognitive:  Appropriate  Engagement in Group:  Good  Modes of Intervention:  Education, Socialization and Support  Summary of Progress/Problems:Patient attended group presentation by Interior and spatial designer of  [employer] [facility] [employer] [facility] [place] ([employer]). Brittany [name] was attentive and appropriate during session.     [name] 05/04/2012, 2:12 PM

## 2012-05-04 NOTE — H&P (Signed)
Signed for Dr. Theotis Barrio.

## 2012-05-04 NOTE — Progress Notes (Signed)
[facility] Discharge [facility]:  Will you be returning to the same living situation after discharge: Yes,  home At discharge, do you have transportation home?:Yes,  family Do you have the ability to pay for your medications:Yes,  medicaid  Interagency Information:     Release of information consent forms completed and in the chart;  Patient's signature needed at discharge.  Patient to Follow up at:  Follow-up Information    Follow up with [facility]. (This is who you call when you find out about an opening at [facility].  Or talk to the folks at [facility] to help you with that process.)    Contact information:   [address] [place]  [phone]      Follow up with [facility]. (Call in the AMs at 9:00  to see about a bed.  Then call [facility] to find out how to proceed.)    Contact information:   [phone]      Follow up with [facility] to daily meetings at [facility] house in [place].   Contact information:   [address]      Follow up with [facility] on 05/13/2012. (11:00 with Silva Bandy)    Contact information:   [address]  [phone]      Follow up with [facility] on 05/28/2012. (1:30 with the Dr.)          Patient denies SI/HI:   Yes,  yes    Safety Planning and Suicide Prevention discussed:  Yes,  yes  Barrier to discharge identified:No.  Summary and Recommendations:   Brittany [name] 05/04/2012, 11:59 AM

## 2012-05-04 NOTE — Treatment Plan (Signed)
Interdisciplinary Treatment Plan Update (Adult)  Date: 05/04/2012  Time Reviewed: 10:09 AM   Progress in Treatment: Attending groups: Yes Participating in groups: Yes Taking medication as prescribed: Yes Tolerating medication: Yes   Family/Significant other contact made:  No Patient understands diagnosis:  Yes As evidenced by asking for help with detox and mood issues Discussing patient identified problems/goals with staff:  Yes  See below Medical problems stabilized or resolved:  Yes Denies suicidal/homicidal ideation: Yes In tx team Issues/concerns per patient self-inventory:  None noted Other:  New problem(s) identified: N/A  Reason for Continuation of Hospitalization: D/C today  Interventions implemented related to continuation of hospitalization:   Additional comments:  Estimated length of stay:  Discharge Plan: Return home, see below  New goal(s): N/A  Review of initial/current patient goals per problem list:   1.  Goal(s): Safely detox from alcohol  Met:  Yes  Target date:5/13  As evidenced ZO:XWRU score of 0, stable vitals  2.  Goal (s): Identify comprehensive sobriety/mental wellness plan  Met:  Yes  Target date:5/13  As evidenced [id number] states she will attend AA mtgs regularly, get a sponsor, attend appointments at [facility], and try to get into [facility] from home  3.  Goal(s):  Met:  No  Target date:  As evidenced by:  4.  Goal(s):  Met:  No  Target date:  As evidenced by:  Attendees: Patient: Brittany [name]  05/04/2012 10:09 AM  Family:     Physician:  [name] 05/04/2012 10:09 AM   Nursing:  Robbie Louis  05/04/2012 10:09 AM   Case Manager:  Richelle Ito, LCSW 05/04/2012 10:09 AM   Counselor:  Ronda Fairly, LCSWA 05/04/2012 10:09 AM   Other:     Other:     Other:     Other:      Scribe for Treatment Team:   Daryel Gerald B, 05/04/2012 10:09 AM
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
## 2012-05-04 NOTE — Progress Notes (Signed)
Pt has been up and active in the milieu today.  She rated both her depression and hopelessness a 2 and her anxiety a 4 d/t her leaving today.  She denied any S/I or H/I or A/V hallucinations.  She has had several prn meds today for her leg pain and vistaril (see daily cares and pain assessment).  Pt has had elevated bp and pulse today.  MD aware and ordered an EKG and to monitor her vitals manual (see vs tab).  Pt was supposed to d/c home today but by 1720 she was stating "I have no ride home"  encourged pt to think of her options but by the time she was going over everyone, her discharge home was cancelled by the PA.  Informed pt and she was relieved.  She plans to set her ride up for 1300 tomorrow.

## 2012-05-05 DIAGNOSIS — F17200 Nicotine dependence, unspecified, uncomplicated: Secondary | ICD-10-CM

## 2012-05-05 DIAGNOSIS — F39 Unspecified mood [affective] disorder: Secondary | ICD-10-CM

## 2012-05-05 DIAGNOSIS — F1020 Alcohol dependence, uncomplicated: Principal | ICD-10-CM

## 2012-05-05 NOTE — Progress Notes (Signed)
Recreation Therapy Notes  05/05/2012         Time: 1415      Group Topic/Focus: The focus of this group is on discussing various styles of communication and communicating assertively using 'I' (feeling) statements.  Participation Level: Active  Participation Quality: Attentive and Redirectable  Affect: Appropriate  Cognitive: Oriented   Additional Comments: Patient required some redirection for laughing at an irritable female peer, but was otherwise appropriate.   Donnarae Rae 05/05/2012 3:05 PM

## 2012-05-05 NOTE — Progress Notes (Signed)
Pt was discharged home today.  She denied any S/I H/I or A/V hallucinations.  She was given f/u appointment, rx, sample medications, hotline info booklet, and letter provided by the case manager.  She voiced understanding to all instructions provided.  She declined the need for smoking cessation materials.   

## 2012-05-05 NOTE — Progress Notes (Signed)
[facility] Group Notes:  (Counselor/Nursing/MHT/Case Management/Adjunct)  05/05/2012 2:09 PM  Type of Therapy:  Group Therapy at 11:00  Participation Level:  Active  Participation Quality:  Appropriate  Affect:  Depressed  Cognitive:  Alert, Appropriate and Oriented  Insight:  Limited  Engagement in Group:  Good  Engagement in Therapy:  Limited  Modes of Intervention:  Clarification, Socialization and Support  Summary of Progress/Problems:  Focus of this processing group was feelings about diagnosis; discussion included denial, bargaining, anger, depression, and acceptance to which patients related to in varying degrees.   Brittany [name] shared how she identified with denial and bargaining and the fact that she feels closer to acceptance at this point.  Patient was called out by nurse before group was over.    Brittany [name] 05/05/2012, 2:12 PM

## 2012-05-05 NOTE — Progress Notes (Signed)
Patient denies SI/HI and A/V hallucinations. Denies withdrawal symptoms. Patient continues to c/o left ankle pain, PRN medication-ibuprofen given, patient verbalizes decreased pain. Patient plans to be discharged  05/05/2012, unable to secure transportation for discharge today.Support and encouragement given, patient voiced no needs or concerns at this time. Will continue to monitor.

## 2012-05-08 NOTE — Progress Notes (Signed)
Patient Discharge Instructions:  After Visit Summary (AVS):   Faxed to:  05/07/2012 Psychiatric Admission Assessment Note:   Faxed to:  05/07/2012 Suicide Risk Assessment - Discharge Assessment:   Faxed to:  05/07/2012 Faxed/Sent to the Next Level Care provider:  05/07/2012  Faxed to [facility] @ [phone]  Wandra Scot, 05/08/2012, 11:19 AM

## 2012-05-25 NOTE — Discharge Summary (Signed)
Physician Discharge Summary Note  Patient:  Brittany [name] is an 36 y.o., female MRN:  [id number] DOB:  Jun 08, 1976 Patient phone:  [phone] (home)  Patient address:   [address] [place] [address],   Date of Admission:  05/02/2012 Date of Discharge: 05/05/2012  Discharge Diagnoses:  AXIS I: Alcohol Dependence; Mood Disorder NOS; BPAD, per Hx  AXIS II: Deferred  AXIS III: MVP, per Hx; Oblique distal fibular fracture with widening of the ankle mortise; Lateral malleolar fracture. Forefoot intact. Mild first MTP joint osteoarthritis.  Past Medical History   Diagnosis  Date   .  Bipolar 1 disorder      starting to hear voices   .  Seizures      stress induced   AXIS IV: Moderate  AXIS V: 50  Level of Care:  OP  Hospital Course:   Young presented intoxicated with alcohol screen of 379 mg percent, and lower extremity fractures. She has a history of chronic substance abuse, and was also facing legal challenges related to child custody issues.  She was admitted for dual diagnosis unit, where she was treated with Librium for safe detox. Cymbalta was started for depression and Lamictal was continued, which she been taking for bipolar disorder.  Our counselors helped him identify relapse triggers, and develop a relapse prevention program. Our team recommended long term residential treatment and Brittany [name] was going to pursue getting a bed at [facility].   Consults:  None  Discharge Vitals:   Blood pressure 120/82, pulse 105, temperature 98.5 F (36.9 C), temperature source Oral, resp. rate 16, height 5\' 5"  (1.651 m), weight 81.647 kg (180 lb), last menstrual period 04/12/2012, SpO2 98.00%.  Mental Status Exam: See Mental Status Examination and Suicide Risk Assessment completed by Attending Physician prior to discharge.  Discharge destination:  Home  Is patient on multiple antipsychotic therapies at discharge:  No   Has Patient had three or more failed trials of antipsychotic  monotherapy by history:  No  Recommended Plan for Multiple Antipsychotic Therapies: N/A  Discharge Orders    Future Orders Please Complete By Expires   Diet - low sodium heart healthy      Diet - low sodium heart healthy      Diet - low sodium heart healthy      Increase activity slowly      Discharge instructions      Comments:   Take all medications as prescribed, keep all follow up appointments as scheduled.   Increase activity slowly      Discharge instructions      Comments:   Have your MEDICAID card changed to reflect Dr. Felecia Shelling as your new PCP.  Then call [phone] to schedule a new patient appointments.   Increase activity slowly      Discharge instructions      Comments:   Take all medications as prescribed.  Keep all follow up appointments as scheduled in order to get your prescriptions refilled.     Medication List  As of 05/25/2012 10:07 AM   STOP taking these medications         acetaminophen 500 MG tablet      clonazePAM 0.5 MG tablet      loperamide 2 MG capsule         TAKE these medications      Indication    DULoxetine 60 MG capsule   Commonly known as: CYMBALTA   Take 1 capsule (60 mg total) by mouth daily. For  anxiety and depression and pain.    Indication: Generalized Anxiety Disorder, Major Depressive Disorder, Musculoskeletal Pain, Neuropathic Pain      lamoTRIgine 200 MG tablet   Commonly known as: LAMICTAL   Take 1 tablet (200 mg total) by mouth daily. For depression.    Indication: Depression      traZODone 100 MG tablet   Commonly known as: DESYREL   Take 2 tablets (200 mg total) by mouth at bedtime. For sleep.    Indication: Trouble Sleeping           Follow-up Information    Follow up with [facility]. (This is who you call when you find out about an opening at [facility].  Or talk to the folks at [facility] to help you with that process.)    Contact information:   [address]  [phone]      Follow up with [facility]. (Call in  the AMs at 9:00  to see about a bed.  Then call [facility] to find out how to proceed.)    Contact information:   [phone]      Follow up with [facility] at [facility] in [place].   Contact information:   [address]      Follow up with [facility] on 05/13/2012. (11:00 with Kristi)    Contact information:   [address]  [phone]      Follow up with [facility] on 05/28/2012. (1:30 with the Dr.)          Follow-up recommendations:  Activity:  unrestricted Diet:  regular  Signed: Harding Thomure A 05/25/2012, 10:07 AM

## 2012-06-05 ENCOUNTER — Emergency Department (HOSPITAL_COMMUNITY): Payer: Self-pay

## 2012-06-05 ENCOUNTER — Emergency Department (HOSPITAL_COMMUNITY)
Admission: EM | Admit: 2012-06-05 | Discharge: 2012-06-05 | Disposition: A | Discharge status: Home / Self Care | Payer: Self-pay | Attending: Emergency Medicine | Admitting: Emergency Medicine

## 2012-06-05 ENCOUNTER — Encounter (HOSPITAL_COMMUNITY): Payer: Self-pay | Admitting: *Deleted

## 2012-06-05 DIAGNOSIS — S82899A Other fracture of unspecified lower leg, initial encounter for closed fracture: Secondary | ICD-10-CM

## 2012-06-05 DIAGNOSIS — M25572 Pain in left ankle and joints of left foot: Secondary | ICD-10-CM

## 2012-06-05 DIAGNOSIS — Z87891 Personal history of nicotine dependence: Secondary | ICD-10-CM | POA: Insufficient documentation

## 2012-06-05 DIAGNOSIS — IMO0001 Reserved for inherently not codable concepts without codable children: Secondary | ICD-10-CM | POA: Insufficient documentation

## 2012-06-05 DIAGNOSIS — R011 Cardiac murmur, unspecified: Secondary | ICD-10-CM | POA: Insufficient documentation

## 2012-06-05 DIAGNOSIS — M25579 Pain in unspecified ankle and joints of unspecified foot: Secondary | ICD-10-CM | POA: Insufficient documentation

## 2012-06-05 DIAGNOSIS — F319 Bipolar disorder, unspecified: Secondary | ICD-10-CM | POA: Insufficient documentation

## 2012-06-05 DIAGNOSIS — G40909 Epilepsy, unspecified, not intractable, without status epilepticus: Secondary | ICD-10-CM | POA: Insufficient documentation

## 2012-06-05 HISTORY — DX: Nonrheumatic mitral (valve) prolapse: I34.1

## 2012-06-05 NOTE — ED Notes (Signed)
Injury to lt ankle in May, seen at [facility] and dx with fracture.  Since then lost insurance. And wants to be checked.

## 2012-06-05 NOTE — Discharge Instructions (Signed)
Review of your x-ray today in the may x-ray showed no significant change, and no significant emergent findings. It is suggested that this be followed via one of the orthopedic specialist. Please continue to use the ankle boot that was given. Call the medical social worker to discuss possible resources.Ankle Fracture A fracture is a break in the bone. A cast or splint is used to protect and keep your injured bone from moving.  HOME CARE INSTRUCTIONS   Use your crutches as directed.   To lessen the swelling, keep the injured leg elevated while sitting or lying down.   Apply ice to the injury for 15 to 20 minutes, 3 to 4 times per day while awake for 2 days. Put the ice in a plastic bag and place a thin towel between the bag of ice and your cast.   If you have a plaster or fiberglass cast:   Do not try to scratch the skin under the cast using sharp or pointed objects.   Check the skin around the cast every day. You may put lotion on any red or sore areas.   Keep your cast dry and clean.   If you have a plaster splint:   Wear the splint as directed.   You may loosen the elastic around the splint if your toes become numb, tingle, or turn cold or blue.   Do not put pressure on any part of your cast or splint; it may break. Rest your cast only on a pillow the first 24 hours until it is fully hardened.   Your cast or splint can be protected during bathing with a plastic bag. Do not lower the cast or splint into water.   Take medications as directed by your caregiver. Only take over-the-counter or prescription medicines for pain, discomfort, or fever as directed by your caregiver.   Do not drive a vehicle until your caregiver specifically tells you it is safe to do so.   If your caregiver has given you a follow-up appointment, it is very important to keep that appointment. Not keeping the appointment could result in a chronic or permanent injury, pain, and disability. If there is any problem  keeping the appointment, you must call back to this facility for assistance.  SEEK IMMEDIATE MEDICAL CARE IF:   Your cast gets damaged or breaks.   You have continued severe pain or more swelling than you did before the cast was put on.   Your skin or toenails below the injury turn blue or gray, or feel cold or numb.   There is a bad smell or new stains and/or purulent (pus like) drainage coming from under the cast.  If you do not have a window in your cast for observing the wound, a discharge or minor bleeding may show up as a stain on the outside of your cast. Report these findings to your caregiver. MAKE SURE YOU:   Understand these instructions.   Will watch your condition.   Will get help right away if you are not doing well or get worse.  Document Released: 12/06/2000 Document Revised: 11/28/2011 Document Reviewed: 07/12/2008 [facility] Patient Information 2012 [place], [place].

## 2012-06-05 NOTE — ED Notes (Signed)
Pt c/o pain in her left ankle. States that she was diagnosed with a broken ankle on Apr 29, 2012 and was placed in a boot to wear. States that she has lost her insurance and cannot follow up with orthopedic MD. Pt c/o continuing pain and doesn't feel like it is healing so she came to have it checked. Redness noted on inner lower leg. Alert and oriented x 3. Skin warm and dry. Color pink.

## 2012-06-05 NOTE — ED Provider Notes (Signed)
History     CSN: [id number]  Arrival date & time 06/05/12  1058   First MD Initiated Contact with Patient 06/05/12 1150      Chief Complaint  Patient presents with  . Ankle Pain    (Consider location/radiation/quality/duration/timing/severity/associated sxs/prior treatment) HPI Comments: Patient states she sustained a fracture of the left ankle and may. She was seen at the [facility] at which time she was treated and advised to see an orthopedic physician for followup. The patient lost her insurance and was unable to see an orthopedist. She continue to use her" blue". The patient states recently she had been feeling as though that the portion that was broken was going to" pop out" and she presents to the emergency room to have this evaluated. There's been no new injury to the left ankle.    Patient is a 36 y.o. female presenting with ankle pain. The history is provided by the patient.  Ankle Pain     Past Medical History  Diagnosis Date  . Bipolar 1 disorder     starting to hear voices  . Seizures     stress induced  . MVP (mitral valve prolapse)     Past Surgical History  Procedure Date  . Nasal sinus surgery     History reviewed. No pertinent family history.  History  Substance Use Topics  . Smoking status: Former Smoker -- 10 years    Types: Cigarettes  . Smokeless tobacco: Not on file  . Alcohol Use: No     24 ounces once q 2 weeks    OB History    Grav Para Term Preterm Abortions TAB SAB Ect Mult Living                  Review of Systems  Constitutional: Negative for activity change.       All ROS Neg except as noted in HPI  HENT: Negative for nosebleeds and neck pain.   Eyes: Negative for photophobia and discharge.  Respiratory: Negative for cough, shortness of breath and wheezing.   Cardiovascular: Negative for chest pain and palpitations.  Gastrointestinal: Negative for abdominal pain and blood in stool.  Genitourinary: Negative for  dysuria, frequency and hematuria.  Musculoskeletal: Negative for back pain and arthralgias.  Skin: Negative.   Neurological: Positive for seizures. Negative for dizziness and speech difficulty.  Psychiatric/Behavioral: Negative for hallucinations and confusion.    Allergies  Vicoprofen and Morphine and related  Home Medications   Current Outpatient Rx  Name Route Sig Dispense Refill  . DULOXETINE HCL 60 MG PO CPEP Oral Take 1 capsule (60 mg total) by mouth daily. For anxiety and depression and pain. 30 capsule 0  . LAMOTRIGINE 200 MG PO TABS Oral Take 1 tablet (200 mg total) by mouth daily. For depression. 30 tablet 0    BP 134/99  Pulse 97  Temp 98.9 F (37.2 C) (Oral)  Resp 20  Ht 5\' 4"  (1.626 m)  Wt 180 lb (81.647 kg)  BMI 30.90 kg/m2  SpO2 99%  LMP 05/18/2012  Physical Exam  Cardiovascular:  Murmur heard. Musculoskeletal:       There is mild to moderate soreness of the lateral malleolus on the left. No significant swelling. There is full range of motion of the toes. Good capillary refill noted. The distal pulses and sensory are symmetrical. Is full range of motion of the left knee and left hip.    ED Course  Procedures (including critical care time)  Labs Reviewed - No data to display Dg Ankle Complete Left  06/05/2012  *RADIOLOGY REPORT*  Clinical Data: Pain and swelling post fall  LEFT ANKLE COMPLETE - 3+ VIEW  Comparison: 04/29/2012  Findings: Oblique distal left fibular fracture minimally displaced laterally, similar to prior study. Tibiotalar alignment normal. Soft tissue swelling greatest laterally. No additional fracture, dislocation, or bone destruction.  IMPRESSION: Minimally displaced oblique distal left fibular fracture little changed since 05/09/2012.  Original Report Authenticated By: Lollie Marrow, M.D.     No diagnosis found.    MDM  I have reviewed nursing notes, vital signs, and all appropriate lab and imaging results for this patient. I have  reviewed the x-rays from May as well as the x-ray for today June 16. The patient has a minimally displaced oblique fracture of the distal fibula there is mild soft tissue swelling present there is no significant change from the May evaluation.  Emphasized to patient that she would need orthopedic evaluation of this area. Also advised that the medical social worker for Hospital may be able to be of help due to to her insurance status.       Kathie Dike, [place] 06/05/12 1207

## 2012-06-09 NOTE — ED Provider Notes (Signed)
Medical screening examination/treatment/procedure(s) were performed by non-physician practitioner and as supervising physician I was immediately available for consultation/collaboration.   Shelda Jakes, MD 06/09/12 1311

## 2012-08-18 ENCOUNTER — Inpatient Hospital Stay (HOSPITAL_COMMUNITY)
Admission: EM | Admit: 2012-08-18 | Discharge: 2012-08-21 | DRG: 603 | Disposition: A | Discharge status: Home / Self Care | Payer: Medicaid Other | Attending: Internal Medicine | Admitting: Internal Medicine

## 2012-08-18 ENCOUNTER — Encounter (HOSPITAL_COMMUNITY): Payer: Self-pay | Admitting: Emergency Medicine

## 2012-08-18 DIAGNOSIS — L0390 Cellulitis, unspecified: Secondary | ICD-10-CM

## 2012-08-18 DIAGNOSIS — R569 Unspecified convulsions: Secondary | ICD-10-CM | POA: Diagnosis present

## 2012-08-18 DIAGNOSIS — L0201 Cutaneous abscess of face: Principal | ICD-10-CM | POA: Diagnosis present

## 2012-08-18 DIAGNOSIS — F319 Bipolar disorder, unspecified: Secondary | ICD-10-CM

## 2012-08-18 DIAGNOSIS — R52 Pain, unspecified: Secondary | ICD-10-CM

## 2012-08-18 DIAGNOSIS — S82892A Other fracture of left lower leg, initial encounter for closed fracture: Secondary | ICD-10-CM

## 2012-08-18 DIAGNOSIS — L03211 Cellulitis of face: Principal | ICD-10-CM | POA: Diagnosis present

## 2012-08-18 DIAGNOSIS — Z79899 Other long term (current) drug therapy: Secondary | ICD-10-CM

## 2012-08-18 DIAGNOSIS — F1020 Alcohol dependence, uncomplicated: Secondary | ICD-10-CM

## 2012-08-18 DIAGNOSIS — Z87891 Personal history of nicotine dependence: Secondary | ICD-10-CM

## 2012-08-18 DIAGNOSIS — D509 Iron deficiency anemia, unspecified: Secondary | ICD-10-CM | POA: Diagnosis present

## 2012-08-18 DIAGNOSIS — I059 Rheumatic mitral valve disease, unspecified: Secondary | ICD-10-CM | POA: Diagnosis present

## 2012-08-18 LAB — CBC WITH DIFFERENTIAL/PLATELET
Basophils Relative: 0 % (ref 0–1)
Eosinophils Absolute: 0.2 10*3/uL (ref 0.0–0.7)
MCH: 24.4 pg — ABNORMAL LOW (ref 26.0–34.0)
MCHC: 31.4 g/dL (ref 30.0–36.0)
Neutrophils Relative %: 81 % — ABNORMAL HIGH (ref 43–77)
Platelets: 426 10*3/uL — ABNORMAL HIGH (ref 150–400)
RDW: 14.5 % (ref 11.5–15.5)

## 2012-08-18 LAB — COMPREHENSIVE METABOLIC PANEL
ALT: 9 U/L (ref 0–35)
Albumin: 3.4 g/dL — ABNORMAL LOW (ref 3.5–5.2)
Alkaline Phosphatase: 68 U/L (ref 39–117)
BUN: 4 mg/dL — ABNORMAL LOW (ref 6–23)
Potassium: 3.6 mEq/L (ref 3.5–5.1)
Sodium: 136 mEq/L (ref 135–145)
Total Protein: 7.6 g/dL (ref 6.0–8.3)

## 2012-08-18 MED ORDER — VANCOMYCIN HCL IN DEXTROSE 1-5 GM/200ML-% IV SOLN
1000.0000 mg | Freq: Once | INTRAVENOUS | Status: AC
  Administered 2012-08-18: 1000 mg via INTRAVENOUS
  Filled 2012-08-18: qty 200

## 2012-08-18 MED ORDER — ACETAMINOPHEN 325 MG PO TABS
650.0000 mg | ORAL_TABLET | Freq: Once | ORAL | Status: AC
  Administered 2012-08-18: 650 mg via ORAL
  Filled 2012-08-18: qty 2

## 2012-08-18 MED ORDER — TRAZODONE HCL 50 MG PO TABS
200.0000 mg | ORAL_TABLET | Freq: Every day | ORAL | Status: DC
  Administered 2012-08-18 – 2012-08-20 (×3): 200 mg via ORAL
  Filled 2012-08-18 (×3): qty 4

## 2012-08-18 MED ORDER — IBUPROFEN 800 MG PO TABS
800.0000 mg | ORAL_TABLET | Freq: Three times a day (TID) | ORAL | Status: DC
  Administered 2012-08-19 – 2012-08-21 (×7): 800 mg via ORAL
  Filled 2012-08-18 (×7): qty 1

## 2012-08-18 MED ORDER — ONDANSETRON HCL 4 MG/2ML IJ SOLN
4.0000 mg | Freq: Once | INTRAMUSCULAR | Status: AC
  Administered 2012-08-18: 4 mg via INTRAVENOUS
  Filled 2012-08-18: qty 2

## 2012-08-18 MED ORDER — MORPHINE SULFATE 2 MG/ML IJ SOLN
2.0000 mg | INTRAMUSCULAR | Status: DC | PRN
  Administered 2012-08-18 – 2012-08-20 (×12): 2 mg via INTRAVENOUS
  Filled 2012-08-18 (×12): qty 1

## 2012-08-18 MED ORDER — PANTOPRAZOLE SODIUM 40 MG PO TBEC
40.0000 mg | DELAYED_RELEASE_TABLET | Freq: Every day | ORAL | Status: DC
  Administered 2012-08-18 – 2012-08-21 (×4): 40 mg via ORAL
  Filled 2012-08-18 (×4): qty 1

## 2012-08-18 MED ORDER — MORPHINE SULFATE 4 MG/ML IJ SOLN
4.0000 mg | Freq: Once | INTRAMUSCULAR | Status: AC
  Administered 2012-08-18: 4 mg via INTRAVENOUS
  Filled 2012-08-18: qty 1

## 2012-08-18 MED ORDER — ONDANSETRON HCL 4 MG PO TABS: 4.0000 mg | ORAL_TABLET | Freq: Four times a day (QID) | ORAL | Status: DC | PRN

## 2012-08-18 MED ORDER — IBUPROFEN 800 MG PO TABS
800.0000 mg | ORAL_TABLET | Freq: Four times a day (QID) | ORAL | Status: DC | PRN
  Administered 2012-08-18: 800 mg via ORAL
  Filled 2012-08-18: qty 1

## 2012-08-18 MED ORDER — SODIUM CHLORIDE 0.9 % IV SOLN
INTRAVENOUS | Status: DC
  Administered 2012-08-18: 100 mL/h via INTRAVENOUS
  Administered 2012-08-19: 03:00:00 via INTRAVENOUS

## 2012-08-18 MED ORDER — OXYCODONE HCL 5 MG PO TABS
5.0000 mg | ORAL_TABLET | ORAL | Status: DC | PRN
  Administered 2012-08-18: 5 mg via ORAL
  Filled 2012-08-18: qty 1

## 2012-08-18 MED ORDER — LAMOTRIGINE 100 MG PO TABS
200.0000 mg | ORAL_TABLET | Freq: Every day | ORAL | Status: DC
  Administered 2012-08-19 – 2012-08-21 (×3): 200 mg via ORAL
  Filled 2012-08-18 (×7): qty 2

## 2012-08-18 MED ORDER — BUSPIRONE HCL 5 MG PO TABS
10.0000 mg | ORAL_TABLET | Freq: Two times a day (BID) | ORAL | Status: DC
  Administered 2012-08-18 – 2012-08-21 (×6): 10 mg via ORAL
  Filled 2012-08-18: qty 2
  Filled 2012-08-18: qty 1
  Filled 2012-08-18 (×4): qty 2
  Filled 2012-08-18: qty 1

## 2012-08-18 MED ORDER — ONDANSETRON HCL 4 MG/2ML IJ SOLN: 4.0000 mg | Freq: Four times a day (QID) | INTRAMUSCULAR | Status: DC | PRN

## 2012-08-18 MED ORDER — SODIUM CHLORIDE 0.9 % IV SOLN
Freq: Once | INTRAVENOUS | Status: AC
  Administered 2012-08-18: 20 mL/h via INTRAVENOUS

## 2012-08-18 MED ORDER — DIPHENHYDRAMINE HCL 50 MG/ML IJ SOLN
25.0000 mg | Freq: Once | INTRAMUSCULAR | Status: AC
  Administered 2012-08-18: 25 mg via INTRAVENOUS
  Filled 2012-08-18: qty 1

## 2012-08-18 MED ORDER — VANCOMYCIN HCL IN DEXTROSE 1-5 GM/200ML-% IV SOLN
1000.0000 mg | Freq: Two times a day (BID) | INTRAVENOUS | Status: DC
  Administered 2012-08-18 – 2012-08-20 (×4): 1000 mg via INTRAVENOUS
  Filled 2012-08-18 (×4): qty 200

## 2012-08-18 MED ORDER — CLONAZEPAM 0.5 MG PO TABS
0.5000 mg | ORAL_TABLET | Freq: Three times a day (TID) | ORAL | Status: DC | PRN
  Administered 2012-08-18 – 2012-08-20 (×3): 0.5 mg via ORAL
  Filled 2012-08-18 (×3): qty 1

## 2012-08-18 MED ORDER — DULOXETINE HCL 60 MG PO CPEP
60.0000 mg | ORAL_CAPSULE | Freq: Every day | ORAL | Status: DC
  Administered 2012-08-18 – 2012-08-21 (×4): 60 mg via ORAL
  Filled 2012-08-18 (×4): qty 1

## 2012-08-18 MED ORDER — HEPARIN SODIUM (PORCINE) 5000 UNIT/ML IJ SOLN
5000.0000 [IU] | Freq: Three times a day (TID) | INTRAMUSCULAR | Status: DC
  Administered 2012-08-18 – 2012-08-19 (×3): 5000 [IU] via SUBCUTANEOUS
  Filled 2012-08-18 (×5): qty 1

## 2012-08-18 NOTE — ED Notes (Signed)
Pt states had pimple on Friday on chin. Pt presents today with draining abscess, fever and swelling bottom lip. Pt did recently start new medication busprone  A few days ago and thought maybe it was the culprit.

## 2012-08-18 NOTE — H&P (Signed)
[facility] History and Physical  BREIGH ANNETT ZOX:[id number] DOB: 09-Jul-1976 DOA: 08/18/2012  Referring physician: Dr. Lynelle Doctor, emergency room physician. PCP: Cassell Smiles., MD   Chief Complaint: Chin and lips swelling and redness.  HPI: Brittany [name] is a 36 y.o. female comes in with the above symptoms which have been present for the last 3 days or so. She noticed a boil on her chin and she picked at it. Unfortunately this probably caused the subsequent cellulitis that she now has. She has been feeling feverish. She is able to eat and drink despite her lower mouth being swollen. She has not had any rigors. She is not diabetic. She is now being referred for admission as it was felt that outpatient antibiotics would not resolve her problems.  Review of Systems:  Apart from history of present illness, other systems negative.  Past Medical History  Diagnosis Date  . Bipolar 1 disorder     starting to hear voices  . Seizures     stress induced  . MVP (mitral valve prolapse)    Past Surgical History  Procedure Date  . Nasal sinus surgery    Social History:  She is single, lives with her parents. She probably does not smoke cigarettes nor does she drink alcohol anymore. She does have a history of alcoholism in the past. She has no children.  Allergies  Allergen Reactions  . Vicoprofen (Hydrocodone-Ibuprofen) Nausea Only  . Morphine And Related     Itching     No family history on file. no family history of diabetes.  Prior to Admission medications   Medication Sig Start Date End Date Taking? Authorizing Provider  clonazePAM (KLONOPIN) 0.5 MG tablet Take 0.5 mg by mouth 3 (three) times daily as needed. For anxiety    Historical Provider, MD  DULoxetine (CYMBALTA) 60 MG capsule Take 60 mg by mouth daily. For anxiety and depression and pain.  05/04/12 05/04/13  Verne Spurr, PA-C  ibuprofen (ADVIL,MOTRIN) 200 MG tablet Take 800 mg by mouth every 6 (six) hours as  needed. For leg pain    Historical Provider, MD  lamoTRIgine (LAMICTAL) 200 MG tablet Take 200 mg by mouth daily. For depression.  05/04/12   Verne Spurr, PA-C  traZODone (DESYREL) 100 MG tablet Take 200 mg by mouth at bedtime.    Historical Provider, MD   Physical Exam: Filed Vitals:   08/18/12 1033  BP: 135/79  Pulse: 123  Temp: 100.2 F (37.9 C)  TempSrc: Oral  Resp: 20  Height: 5\' 5"  (1.651 m)  Weight: 81.647 kg (180 lb)  SpO2: 100%     General:  She looks systemically well. She is not toxic or septic.  Eyes: No jaundice. No pallor.  ENT: She has swelling of her lower lips and chin with coexistent cellulitis. There is no obvious adenopathy but she is tender in her neck so I WOULD not be surprised if she does have lymphadenopathy.  Neck: As above.  Cardiovascular: Heart sounds are present and normal. There are no murmurs.  Respiratory: Lung fields are clear.  Abdomen: Soft, nontender. No hepatomegaly, splenomegaly.  Skin: Cellulitis around her lower lips, chin and surrounding face.  Musculoskeletal: No joint problems.  Psychiatric: Appropriate affect.  Neurologic: Alert and orientated without any focal neurological signs. She is not delirious.  Labs on Admission:  Basic Metabolic Panel:  Lab 08/18/12 8119  NA 136  K 3.6  CL 101  CO2 26  GLUCOSE 92  BUN 4*  CREATININE  0.51  CALCIUM 9.4  MG --  PHOS --   Liver Function Tests:  Lab 08/18/12 1239  AST 11  ALT 9  ALKPHOS 68  BILITOT 0.2*  PROT 7.6  ALBUMIN 3.4*     CBC:  Lab 08/18/12 1239  WBC 11.6*  NEUTROABS 9.4*  HGB 10.5*  HCT 33.4*  MCV 77.5*  PLT 426*        Assessment/Plan   1. Cellulitis involving the lower chin, lower lips with central boil. She likely has community MRSA. We will admit this lady for intravenous antibiotics.   Code Status: Full code. Family Communication: Discussed plan with patient and patient's mother at the bedside. Disposition Plan: Home in medically  stable.  Time spent: 30 minutes.  Wilson Singer [facility] Pager [phone].  If 7PM-7AM, please contact night-coverage [email or web address] Password [facility] 08/18/2012, 3:13 PM

## 2012-08-18 NOTE — ED Notes (Signed)
Pt noticed a blemish on Friday evening.  Patient popped blemish which now has created an abscess and has swollen.. Patient feels pain radiating to behind right ear.

## 2012-08-18 NOTE — ED Provider Notes (Cosign Needed)
History  This chart was scribed for [name], MD by [name]. This patient was seen in room APA10/APA10 and the patient's care was started at 1021.   CSN: [id number]  Arrival date & time 08/18/12  1021   First MD Initiated Contact with Patient 08/18/12 1145      Chief Complaint  Patient presents with  . Abscess  . Fever   The history is provided by the patient. No language interpreter was used.   Brittany [name] is a 36 y.o. female who presents to the Emergency Department of a rapidly worsening constant abscess to her lower lip/chin area which worsened since 3 days ago. She states that it was just a pimple and she popped it once on Friday 4 days ago but the area increased in swelling, redness, drainage, and tenderness starting the next day and is getting continuing worse. She states it is associated with fever and chills. She tried using a warm compress without any improvement in her symptoms. She has a history of boils but has never had one this severe before. Also states her boyfriend has had boils.  PCP Dr Sherwood Gambler  Past Medical History  Diagnosis Date  . Bipolar 1 disorder     starting to hear voices  . Seizures     stress induced  . MVP (mitral valve prolapse)     Past Surgical History  Procedure Date  . Nasal sinus surgery     No family history on file.  History  Substance Use Topics  . Smoking status: Former Smoker -- 10 years    Types: Cigarettes  . Smokeless tobacco: Not on file  . Alcohol Use: No     24 ounces once q 2 weeks  unemployed  OB History    Grav Para Term Preterm Abortions TAB SAB Ect Mult Living                  Review of Systems  Constitutional: Positive for fever and chills.  HENT: Negative for congestion and mouth sores.   Respiratory: Negative for shortness of breath.   Gastrointestinal: Negative for nausea, vomiting and abdominal pain.  Skin:       Abscess of her lower lip and chin area. Redness swelling and pain.     Neurological: Negative for weakness.  All other systems reviewed and are negative.    Allergies  Vicoprofen and Morphine and related  Home Medications   Current Outpatient Rx  Name Route Sig Dispense Refill  . CLONAZEPAM 0.5 MG PO TABS Oral Take 0.5 mg by mouth 3 (three) times daily as needed. For anxiety    . DULOXETINE HCL 60 MG PO CPEP Oral Take 60 mg by mouth daily. For anxiety and depression and pain.     . IBUPROFEN 200 MG PO TABS Oral Take 800 mg by mouth every 6 (six) hours as needed. For leg pain    . LAMOTRIGINE 200 MG PO TABS Oral Take 200 mg by mouth daily. For depression.     . TRAZODONE HCL 100 MG PO TABS Oral Take 200 mg by mouth at bedtime.      Triage Vitals: BP 135/79  Pulse 123  Temp 100.2 F (37.9 C) (Oral)  Resp 20  Ht 5\' 5"  (1.651 m)  Wt 180 lb (81.647 kg)  BMI 29.95 kg/m2  SpO2 100%  LMP 07/17/2012  Vital signs normal    Physical Exam  Nursing note and vitals reviewed. Constitutional: She is oriented to  person, place, and time. She appears well-developed and well-nourished.  Non-toxic appearance. She does not appear ill. No distress.  HENT:  Head: Normocephalic and atraumatic.  Right Ear: External ear normal.  Left Ear: External ear normal.  Nose: Nose normal. No mucosal edema or rhinorrhea.  Mouth/Throat: Oropharynx is clear and moist and mucous membranes are normal. No dental abscesses or uvula swelling.       No swelling of her gums. No dental caries, + plague. Lower lip diffusely red, swollen and tender, hardened on right side. Has pustular lesion in his mid chin with purulent drainage. Diffusely tender to post auricular regions with fullness and tenerness to proximal posterior cervical triangle.  Eyes: Conjunctivae and EOM are normal. Pupils are equal, round, and reactive to light.  Neck: Normal range of motion and full passive range of motion without pain. Neck supple.  Cardiovascular: Normal rate, regular rhythm and normal heart sounds.   Exam reveals no gallop and no friction rub.   No murmur heard. Pulmonary/Chest: Effort normal and breath sounds normal. No respiratory distress. She has no wheezes. She has no rhonchi. She has no rales. She exhibits no tenderness and no crepitus.  Abdominal: Soft. Normal appearance and bowel sounds are normal. She exhibits no distension. There is no tenderness. There is no rebound and no guarding.  Musculoskeletal: Normal range of motion. She exhibits no edema and no tenderness.       Moves all extremities well.   Neurological: She is alert and oriented to person, place, and time. She has normal strength. No cranial nerve deficit.  Skin: Skin is warm, dry and intact. No rash noted. No erythema. No pallor.  Psychiatric: She has a normal mood and affect. Her speech is normal and behavior is normal. Her mood appears not anxious.    ED Course  Procedures (including critical care time)   Medications  0.9 %  sodium chloride infusion (20 mL/hr Intravenous New Bag/Given 08/18/12 1150)  vancomycin (VANCOCIN) IVPB 1000 mg/200 mL premix (0 mg Intravenous Stopped 08/18/12 1357)  morphine 4 MG/ML injection 4 mg (4 mg Intravenous Given 08/18/12 1255)  ondansetron (ZOFRAN) injection 4 mg (4 mg Intravenous Given 08/18/12 1257)  diphenhydrAMINE (BENADRYL) injection 25 mg (25 mg Intravenous Given 08/18/12 1255)  morphine 4 MG/ML injection 4 mg (4 mg Intravenous Given 08/18/12 1531)  acetaminophen (TYLENOL) tablet 650 mg (650 mg Oral Given 08/18/12 1532)    DIAGNOSTIC STUDIES: Oxygen Saturation is 100% on room air, normal by my interpretation.    COORDINATION OF CARE: At 1230 PM Discussed treatment plan with patient which includes antibiotics, pain/nausea medicine, and IV fluids. Patient agrees.   14:57 Dr Karilyn Cota, admit to med-surg  Results for orders placed during the hospital encounter of 08/18/12  CBC WITH DIFFERENTIAL      Component Value Range   WBC 11.6 (*) 4.0 - 10.5 K/uL   RBC 4.31  3.87 - 5.11  MIL/uL   Hemoglobin 10.5 (*) 12.0 - 15.0 g/dL   HCT 16.1 (*) 09.6 - 04.5 %   MCV 77.5 (*) 78.0 - 100.0 fL   MCH 24.4 (*) 26.0 - 34.0 pg   MCHC 31.4  30.0 - 36.0 g/dL   RDW 40.9  81.1 - 91.4 %   Platelets 426 (*) 150 - 400 K/uL   Neutrophils Relative 81 (*) 43 - 77 %   Neutro Abs 9.4 (*) 1.7 - 7.7 K/uL   Lymphocytes Relative 12  12 - 46 %   Lymphs Abs 1.3  0.7 - 4.0 K/uL   Monocytes Relative 6  3 - 12 %   Monocytes Absolute 0.7  0.1 - 1.0 K/uL   Eosinophils Relative 1  0 - 5 %   Eosinophils Absolute 0.2  0.0 - 0.7 K/uL   Basophils Relative 0  0 - 1 %   Basophils Absolute 0.1  0.0 - 0.1 K/uL  COMPREHENSIVE METABOLIC PANEL      Component Value Range   Sodium 136  135 - 145 mEq/L   Potassium 3.6  3.5 - 5.1 mEq/L   Chloride 101  96 - 112 mEq/L   CO2 26  19 - 32 mEq/L   Glucose, Bld 92  70 - 99 mg/dL   BUN 4 (*) 6 - 23 mg/dL   Creatinine, Ser 4.09  0.50 - 1.10 mg/dL   Calcium 9.4  8.4 - 81.1 mg/dL   Total Protein 7.6  6.0 - 8.3 g/dL   Albumin 3.4 (*) 3.5 - 5.2 g/dL   AST 11  0 - 37 U/L   ALT 9  0 - 35 U/L   Alkaline Phosphatase 68  39 - 117 U/L   Total Bilirubin 0.2 (*) 0.3 - 1.2 mg/dL   GFR calc non Af Amer >90  >90 mL/min   GFR calc Af Amer >90  >90 mL/min   Laboratory interpretation all normal except leukocytosis    Diagnoses that have been ruled out:  None  Diagnoses that are still under consideration:  None  Final diagnoses:  Facial abscess   Plan admission  Devoria Albe, MD, FACEP    MDM  I personally performed the services described in this documentation, which was scribed in my presence. The recorded information has been reviewed and considered.  Devoria Albe, MD, Armando Gang           [name], MD 08/18/12 [phone]

## 2012-08-18 NOTE — ED Notes (Signed)
Patient reports feeling hot. Temp checked 100.9. EDP made aware, verbal orders given.

## 2012-08-18 NOTE — Consult Note (Signed)
ANTIBIOTIC CONSULT NOTE - INITIAL  Pharmacy Consult for Vancomycin Indication: cellulitis (chin)  Allergies  Allergen Reactions  . Vicoprofen (Hydrocodone-Ibuprofen) Nausea Only  . Morphine And Related     Itching     Patient Measurements: Height: 5\' 5"  (165.1 cm) Weight: 193 lb 2 oz (87.6 kg) IBW/kg (Calculated) : 57   Vital Signs: Temp: 99.7 F (37.6 C) (08/27 1654) Temp src: Oral (08/27 1654) BP: 115/81 mmHg (08/27 1654) Pulse Rate: 102  (08/27 1654) Intake/Output from previous day:   Intake/Output from this shift:    Labs:  Basename 08/18/12 1239  WBC 11.6*  HGB 10.5*  PLT 426*  LABCREA --  CREATININE 0.51   Estimated Creatinine Clearance: 107.2 ml/min (by C-G formula based on Cr of 0.51). No results found for this basename: VANCOTROUGH:2,VANCOPEAK:2,VANCORANDOM:2,GENTTROUGH:2,GENTPEAK:2,GENTRANDOM:2,TOBRATROUGH:2,TOBRAPEAK:2,TOBRARND:2,AMIKACINPEAK:2,AMIKACINTROU:2,AMIKACIN:2, in the last 72 hours   Microbiology: No results found for this or any previous visit (from the past 720 hour(s)).  Medical History: Past Medical History  Diagnosis Date  . Bipolar 1 disorder     starting to hear voices  . Seizures     stress induced  . MVP (mitral valve prolapse)     Medications:  Scheduled:    . sodium chloride   Intravenous Once  . acetaminophen  650 mg Oral Once  . busPIRone  10 mg Oral BID  . diphenhydrAMINE  25 mg Intravenous Once  . DULoxetine  60 mg Oral Daily  . heparin  5,000 Units Subcutaneous Q8H  . lamoTRIgine  200 mg Oral Daily  . morphine  4 mg Intravenous Once  . morphine  4 mg Intravenous Once  . ondansetron  4 mg Intravenous Once  . traZODone  200 mg Oral QHS  . vancomycin  1,000 mg Intravenous Once  . vancomycin  1,000 mg Intravenous Q12H   Assessment: 36yo female with good renal fxn.  Goal of Therapy:  Vancomycin trough level 10-15 mcg/ml  Plan: Vancomycin 1gm iv q12hrs Check trough at steady state Monitor labs, renal fxn,  and cultures per protocol  Valrie Hart A 08/18/2012,5:06 PM

## 2012-08-19 ENCOUNTER — Encounter (HOSPITAL_COMMUNITY): Payer: Self-pay | Admitting: *Deleted

## 2012-08-19 LAB — CBC
HCT: 32.1 % — ABNORMAL LOW (ref 36.0–46.0)
Hemoglobin: 9.9 g/dL — ABNORMAL LOW (ref 12.0–15.0)
MCHC: 30.8 g/dL (ref 30.0–36.0)
MCV: 78.7 fL (ref 78.0–100.0)

## 2012-08-19 LAB — COMPREHENSIVE METABOLIC PANEL
ALT: 8 U/L (ref 0–35)
Albumin: 3.1 g/dL — ABNORMAL LOW (ref 3.5–5.2)
Alkaline Phosphatase: 64 U/L (ref 39–117)
Calcium: 9 mg/dL (ref 8.4–10.5)
GFR calc Af Amer: >90 mL/min (ref >90)
Glucose, Bld: 110 mg/dL — ABNORMAL HIGH (ref 70–99)
Potassium: 3.7 mEq/L (ref 3.5–5.1)
Sodium: 138 mEq/L (ref 135–145)
Total Protein: 7.2 g/dL (ref 6.0–8.3)

## 2012-08-19 LAB — RETICULOCYTES: Retic Ct Pct: 1 % (ref 0.4–3.1)

## 2012-08-19 LAB — FOLATE: Folate: 12.6 ng/mL

## 2012-08-19 LAB — IRON AND TIBC
Saturation Ratios: 3 % — ABNORMAL LOW (ref 20–55)
TIBC: 357 ug/dL (ref 250–470)
UIBC: 346 ug/dL (ref 125–400)

## 2012-08-19 LAB — VITAMIN B12: Vitamin B-12: 320 pg/mL (ref 211–911)

## 2012-08-19 MED ORDER — METHYLPREDNISOLONE SODIUM SUCC 125 MG IJ SOLR
INTRAMUSCULAR | Status: AC
  Administered 2012-08-19: 125 mg via INTRAVENOUS
  Filled 2012-08-19: qty 2

## 2012-08-19 MED ORDER — METHYLPREDNISOLONE SODIUM SUCC 125 MG IJ SOLR
125.0000 mg | Freq: Once | INTRAMUSCULAR | Status: AC
  Administered 2012-08-19: 125 mg via INTRAVENOUS

## 2012-08-19 MED ORDER — PIPERACILLIN-TAZOBACTAM 3.375 G IVPB
3.3750 g | Freq: Three times a day (TID) | INTRAVENOUS | Status: DC
  Administered 2012-08-19 – 2012-08-21 (×7): 3.375 g via INTRAVENOUS
  Filled 2012-08-19 (×17): qty 50

## 2012-08-19 MED ORDER — SODIUM CHLORIDE 0.9 % IJ SOLN
INTRAMUSCULAR | Status: AC
  Filled 2012-08-19: qty 6

## 2012-08-19 NOTE — Progress Notes (Addendum)
     Subjective: This lady was admitted yesterday with lower lip and chin area cellulitis. She feels that she has more swelling today. She has had no fever.           Physical Exam: Blood pressure 127/80, pulse 77, temperature 98.1 F (36.7 C), temperature source Oral, resp. rate 20, height 5\' 5"  (1.651 m), weight 88.2 kg (194 lb 7.1 oz), last menstrual period 07/17/2012, SpO2 100.00%. She looks systemically well. I think his overall cellulitis actually has improved but indeed her lower face is a little more swollen. She remains alert and orientated.   Investigations:  Recent Results (from the past 240 hour(s))  CULTURE, ROUTINE-ABSCESS     Status: Normal (Preliminary result)   Collection Time   08/18/12 12:44 PM      Component Value Range Status Comment   Specimen Description ABSCESS CHIN   Final    Special Requests NONE   Final    Gram Stain     Final    Value: FEW WBC PRESENT,BOTH PMN AND MONONUCLEAR     NO SQUAMOUS EPITHELIAL CELLS SEEN     RARE GRAM POSITIVE COCCI     IN PAIRS   Culture PENDING   Incomplete    Report Status PENDING   Incomplete   MRSA PCR SCREENING     Status: Normal   Collection Time   08/18/12  5:23 PM      Component Value Range Status Comment   MRSA by PCR NEGATIVE  NEGATIVE Final      Basic Metabolic Panel:  Basename 08/19/12 0507 08/18/12 1239  NA 138 136  K 3.7 3.6  CL 101 101  CO2 28 26  GLUCOSE 110* 92  BUN 3* 4*  CREATININE 0.52 0.51  CALCIUM 9.0 9.4  MG -- --  PHOS -- --   Liver Function Tests:  Basename 08/19/12 0507 08/18/12 1239  AST 9 11  ALT 8 9  ALKPHOS 64 68  BILITOT 0.2* 0.2*  PROT 7.2 7.6  ALBUMIN 3.1* 3.4*     CBC:  Basename 08/19/12 0507 08/18/12 1239  WBC 7.1 11.6*  NEUTROABS -- 9.4*  HGB 9.9* 10.5*  HCT 32.1* 33.4*  MCV 78.7 77.5*  PLT 361 426*        Medications: I have reviewed the patient's current medications.  Impression: 1. Lower lip and chin area cellulitis. 2. Microcytic anemia.  She says that she has regular periods and denies any clots with them. I suspect her microcytic anemia likely iron deficiency anemia form her menstrual cycle.     Plan: 1. Add Zosyn to the vancomycin. Check anemia panel. 2. One dose of intravenous Solu-Medrol to help with inflammation/swelling. 3. She is not quite ready to be discharged on oral antibiotics yet, possibly in the next couple of days.     LOS: 1 day   Wilson Singer Pager [phone]  08/19/2012, 7:45 AM

## 2012-08-19 NOTE — Consult Note (Signed)
ANTIBIOTIC CONSULT NOTE   Pharmacy Consult for Vancomycin and Zosyn Indication: cellulitis (chin)  Allergies  Allergen Reactions  . Vicoprofen (Hydrocodone-Ibuprofen) Nausea Only  . Morphine And Related     Itching    Patient Measurements: Height: 5\' 5"  (165.1 cm) Weight: 194 lb 7.1 oz (88.2 kg) IBW/kg (Calculated) : 57   Vital Signs: Temp: 98.1 F (36.7 C) (08/28 0629) Temp src: Oral (08/28 0110) BP: 127/80 mmHg (08/28 0629) Pulse Rate: 77  (08/28 0629) Intake/Output from previous day: 08/27 0701 - 08/28 0700 In: 2080 [P.O.:480; I.V.:1400; IV Piggyback:200] Out: -  Intake/Output from this shift:    Labs:  Basename 08/19/12 0507 08/18/12 1239  WBC 7.1 11.6*  HGB 9.9* 10.5*  PLT 361 426*  LABCREA -- --  CREATININE 0.52 0.51   Estimated Creatinine Clearance: 107.7 ml/min (by C-G formula based on Cr of 0.52). No results found for this basename: VANCOTROUGH:2,VANCOPEAK:2,VANCORANDOM:2,GENTTROUGH:2,GENTPEAK:2,GENTRANDOM:2,TOBRATROUGH:2,TOBRAPEAK:2,TOBRARND:2,AMIKACINPEAK:2,AMIKACINTROU:2,AMIKACIN:2, in the last 72 hours   Microbiology: Recent Results (from the past 720 hour(s))  CULTURE, ROUTINE-ABSCESS     Status: Normal (Preliminary result)   Collection Time   08/18/12 12:44 PM      Component Value Range Status Comment   Specimen Description ABSCESS CHIN   Final    Special Requests NONE   Final    Gram Stain     Final    Value: FEW WBC PRESENT,BOTH PMN AND MONONUCLEAR     NO SQUAMOUS EPITHELIAL CELLS SEEN     RARE GRAM POSITIVE COCCI     IN PAIRS   Culture Culture reincubated for better growth   Final    Report Status PENDING   Incomplete   MRSA PCR SCREENING     Status: Normal   Collection Time   08/18/12  5:23 PM      Component Value Range Status Comment   MRSA by PCR NEGATIVE  NEGATIVE Final    Medical History: Past Medical History  Diagnosis Date  . Bipolar 1 disorder     starting to hear voices  . Seizures     stress induced  . MVP (mitral valve  prolapse)    Medications:  Scheduled:     . sodium chloride   Intravenous Once  . acetaminophen  650 mg Oral Once  . busPIRone  10 mg Oral BID  . diphenhydrAMINE  25 mg Intravenous Once  . DULoxetine  60 mg Oral Daily  . heparin  5,000 Units Subcutaneous Q8H  . ibuprofen  800 mg Oral TID  . lamoTRIgine  200 mg Oral Daily  . methylPREDNISolone (SOLU-MEDROL) injection  125 mg Intravenous Once  . methylPREDNISolone sodium succinate      . morphine  4 mg Intravenous Once  . morphine  4 mg Intravenous Once  . ondansetron  4 mg Intravenous Once  . pantoprazole  40 mg Oral Q1200  . piperacillin-tazobactam (ZOSYN)  IV  3.375 g Intravenous Q8H  . sodium chloride      . traZODone  200 mg Oral QHS  . vancomycin  1,000 mg Intravenous Once  . vancomycin  1,000 mg Intravenous Q12H   Assessment: 36yo female with cellulitis of ching.  Good renal fxn.  Goal of Therapy:  Vancomycin trough level 10-15 mcg/ml  Plan: Vancomycin 1gm iv q12hrs Check trough tomorrow Zosyn 3.375gm iv q8hrs (4hr infusion) Monitor labs, renal fxn, and cultures per protocol  Margo Aye, Chaske Paskett A 08/19/2012,8:25 AM

## 2012-08-19 NOTE — Progress Notes (Signed)
UR Chart Review Completed  

## 2012-08-20 DIAGNOSIS — L0390 Cellulitis, unspecified: Secondary | ICD-10-CM

## 2012-08-20 DIAGNOSIS — R52 Pain, unspecified: Secondary | ICD-10-CM

## 2012-08-20 DIAGNOSIS — F319 Bipolar disorder, unspecified: Secondary | ICD-10-CM

## 2012-08-20 LAB — CBC
HCT: 32 % — ABNORMAL LOW (ref 36.0–46.0)
Hemoglobin: 9.9 g/dL — ABNORMAL LOW (ref 12.0–15.0)
MCH: 24.3 pg — ABNORMAL LOW (ref 26.0–34.0)
MCHC: 30.9 g/dL (ref 30.0–36.0)
MCV: 78.6 fL (ref 78.0–100.0)

## 2012-08-20 LAB — BASIC METABOLIC PANEL
BUN: 6 mg/dL (ref 6–23)
Chloride: 104 mEq/L (ref 96–112)
Creatinine, Ser: 0.55 mg/dL (ref 0.50–1.10)
Glucose, Bld: 175 mg/dL — ABNORMAL HIGH (ref 70–99)
Potassium: 4.1 mEq/L (ref 3.5–5.1)

## 2012-08-20 MED ORDER — SODIUM CHLORIDE 0.9 % IJ SOLN
INTRAMUSCULAR | Status: AC
  Administered 2012-08-20: 18:00:00
  Filled 2012-08-20: qty 3

## 2012-08-20 MED ORDER — SODIUM CHLORIDE 0.9 % IJ SOLN
INTRAMUSCULAR | Status: AC
  Administered 2012-08-20: 20:00:00
  Filled 2012-08-20: qty 3

## 2012-08-20 MED ORDER — FERROUS SULFATE 325 (65 FE) MG PO TABS
325.0000 mg | ORAL_TABLET | Freq: Every day | ORAL | Status: DC
  Administered 2012-08-21: 325 mg via ORAL
  Filled 2012-08-20: qty 1

## 2012-08-20 MED ORDER — OXYCODONE-ACETAMINOPHEN 5-325 MG PO TABS
1.0000 | ORAL_TABLET | Freq: Four times a day (QID) | ORAL | Status: DC | PRN
  Administered 2012-08-20 – 2012-08-21 (×4): 2 via ORAL
  Filled 2012-08-20 (×4): qty 2

## 2012-08-20 MED ORDER — MORPHINE SULFATE 2 MG/ML IJ SOLN
2.0000 mg | INTRAMUSCULAR | Status: DC | PRN
  Administered 2012-08-20 – 2012-08-21 (×6): 2 mg via INTRAVENOUS
  Filled 2012-08-20 (×6): qty 1

## 2012-08-20 MED ORDER — VANCOMYCIN HCL IN DEXTROSE 1-5 GM/200ML-% IV SOLN
1000.0000 mg | Freq: Three times a day (TID) | INTRAVENOUS | Status: DC
  Administered 2012-08-20 – 2012-08-21 (×3): 1000 mg via INTRAVENOUS
  Filled 2012-08-20 (×12): qty 200

## 2012-08-20 NOTE — Progress Notes (Signed)
[facility] PROGRESS NOTE  Brittany [name] ZOX:[id number] DOB: 05/26/1976 DOA: 08/18/2012 PCP: Cassell Smiles., MD  Assessment/Plan: Active Problems:  Cellulitis  Pain  Bipolar 1 disorder  1. Cellulitis of chin- vanc/zosyn, another day of IV abx while culture grows- staph a so far, nasal swab was MRSA negative, wound in draining, no fevers, eating better, less swelling, given solumedrol x 1 on 8/28 2. Pain- transition to PO medications 3. Bipolar 1- continue home medications 4. Anemia- prob Fe def from menstruation- add Fe, recheck by PCP    Code Status: full Family Communication: mom at bedside Disposition Plan: home in next few days, maybe tomm    Antibiotics:  vanc/zosyn  HPI/Subjective: Wound has started to drain Appetite back, less swelling   Objective: Filed Vitals:   08/19/12 0800 08/19/12 1200 08/19/12 2208 08/20/12 0529  BP:   105/71 113/79  Pulse:   71 87  Temp: 98.2 F (36.8 C) 100.2 F (37.9 C) 98.3 F (36.8 C) 98 F (36.7 C)  TempSrc: Oral Oral Oral Oral  Resp:   18 18  Height:   5\' 5"  (1.651 m)   Weight:      SpO2:   96% 97%    Intake/Output Summary (Last 24 hours) at 08/20/12 1056 Last data filed at 08/20/12 0600  Gross per 24 hour  Intake   1727 ml  Output      0 ml  Net   1727 ml   Filed Weights   08/18/12 1033 08/18/12 1654 08/19/12 0629  Weight: 81.647 kg (180 lb) 87.6 kg (193 lb 2 oz) 88.2 kg (194 lb 7.1 oz)    Exam:   General:  A+Ox3, NAD  Cardiovascular: rrr  Respiratory: clear anterior  Abdomen: +BS, soft, NT/ND  Skin: draining wound on right side of chin  Data Reviewed: Basic Metabolic Panel:  Lab 08/20/12 8119 08/19/12 0507 08/18/12 1239  NA 141 138 136  K 4.1 3.7 3.6  CL 104 101 101  CO2 27 28 26   GLUCOSE 175* 110* 92  BUN 6 3* 4*  CREATININE 0.55 0.52 0.51  CALCIUM 9.2 9.0 9.4  MG -- -- --  PHOS -- -- --   Liver Function Tests:  Lab 08/19/12 0507 08/18/12 1239  AST 9 11  ALT 8 9    ALKPHOS 64 68  BILITOT 0.2* 0.2*  PROT 7.2 7.6  ALBUMIN 3.1* 3.4*   No results found for this basename: LIPASE:5,AMYLASE:5 in the last 168 hours No results found for this basename: AMMONIA:5 in the last 168 hours CBC:  Lab 08/20/12 0441 08/19/12 0507 08/18/12 1239  WBC 9.3 7.1 11.6*  NEUTROABS -- -- 9.4*  HGB 9.9* 9.9* 10.5*  HCT 32.0* 32.1* 33.4*  MCV 78.6 78.7 77.5*  PLT 418* 361 426*   Cardiac Enzymes: No results found for this basename: CKTOTAL:5,CKMB:5,CKMBINDEX:5,TROPONINI:5 in the last 168 hours BNP (last 3 results) No results found for this basename: PROBNP:3 in the last 8760 hours CBG: No results found for this basename: GLUCAP:5 in the last 168 hours  Recent Results (from the past 240 hour(s))  CULTURE, ROUTINE-ABSCESS     Status: Normal (Preliminary result)   Collection Time   08/18/12 12:44 PM      Component Value Range Status Comment   Specimen Description ABSCESS CHIN   Final    Special Requests NONE   Final    Gram Stain     Final    Value: FEW WBC PRESENT,BOTH PMN AND MONONUCLEAR  NO SQUAMOUS EPITHELIAL CELLS SEEN     RARE GRAM POSITIVE COCCI     IN PAIRS   Culture     Final    Value: ABUNDANT STAPHYLOCOCCUS AUREUS     Note: RIFAMPIN AND GENTAMICIN SHOULD NOT BE USED AS SINGLE DRUGS FOR TREATMENT OF STAPH INFECTIONS.   Report Status PENDING   Incomplete   MRSA PCR SCREENING     Status: Normal   Collection Time   08/18/12  5:23 PM      Component Value Range Status Comment   MRSA by PCR NEGATIVE  NEGATIVE Final      Studies: No results found.  Scheduled Meds:    . busPIRone  10 mg Oral BID  . DULoxetine  60 mg Oral Daily  . ferrous sulfate  325 mg Oral Q breakfast  . heparin  5,000 Units Subcutaneous Q8H  . ibuprofen  800 mg Oral TID  . lamoTRIgine  200 mg Oral Daily  . pantoprazole  40 mg Oral Q1200  . piperacillin-tazobactam (ZOSYN)  IV  3.375 g Intravenous Q8H  . sodium chloride      . traZODone  200 mg Oral QHS  . vancomycin  1,000  mg Intravenous Q8H  . DISCONTD: vancomycin  1,000 mg Intravenous Q12H   Continuous Infusions:    . sodium chloride 50 mL/hr at 08/19/12 0817    Active Problems:  Cellulitis  Pain  Bipolar 1 disorder    Time spent: 25    Marlin Canary  [facility] Pager [phone] 08/20/2012, 10:56 AM  LOS: 2 days

## 2012-08-20 NOTE — Consult Note (Signed)
ANTIBIOTIC CONSULT NOTE   Pharmacy Consult for Vancomycin and Zosyn Indication: cellulitis (chin)  Allergies  Allergen Reactions  . Vicoprofen (Hydrocodone-Ibuprofen) Nausea Only  . Morphine And Related     Itching    Patient Measurements: Height: 5\' 5"  (165.1 cm) Weight: 194 lb 7.1 oz (88.2 kg) IBW/kg (Calculated) : 57   Vital Signs: Temp: 98 F (36.7 C) (08/29 0529) Temp src: Oral (08/29 0529) BP: 113/79 mmHg (08/29 0529) Pulse Rate: 87  (08/29 0529) Intake/Output from previous day: 08/28 0701 - 08/29 0700 In: 1727 [I.V.:1727] Out: -  Intake/Output from this shift:    Labs:  Basename 08/20/12 0441 08/19/12 0507 08/18/12 1239  WBC 9.3 7.1 11.6*  HGB 9.9* 9.9* 10.5*  PLT 418* 361 426*  LABCREA -- -- --  CREATININE 0.55 0.52 0.51   Estimated Creatinine Clearance: 107.7 ml/min (by C-G formula based on Cr of 0.55).  Basename 08/20/12 0845  VANCOTROUGH <5.0*  VANCOPEAK --  Drue Dun --  GENTTROUGH --  GENTPEAK --  GENTRANDOM --  TOBRATROUGH --  TOBRAPEAK --  TOBRARND --  AMIKACINPEAK --  AMIKACINTROU --  AMIKACIN --    Microbiology: Recent Results (from the past 720 hour(s))  CULTURE, ROUTINE-ABSCESS     Status: Normal (Preliminary result)   Collection Time   08/18/12 12:44 PM      Component Value Range Status Comment   Specimen Description ABSCESS CHIN   Final    Special Requests NONE   Final    Gram Stain     Final    Value: FEW WBC PRESENT,BOTH PMN AND MONONUCLEAR     NO SQUAMOUS EPITHELIAL CELLS SEEN     RARE GRAM POSITIVE COCCI     IN PAIRS   Culture     Final    Value: ABUNDANT STAPHYLOCOCCUS AUREUS     Note: RIFAMPIN AND GENTAMICIN SHOULD NOT BE USED AS SINGLE DRUGS FOR TREATMENT OF STAPH INFECTIONS.   Report Status PENDING   Incomplete   MRSA PCR SCREENING     Status: Normal   Collection Time   08/18/12  5:23 PM      Component Value Range Status Comment   MRSA by PCR NEGATIVE  NEGATIVE Final    Medical History: Past Medical History    Diagnosis Date  . Bipolar 1 disorder     starting to hear voices  . Seizures     stress induced  . MVP (mitral valve prolapse)    Medications:  Scheduled:     . busPIRone  10 mg Oral BID  . DULoxetine  60 mg Oral Daily  . heparin  5,000 Units Subcutaneous Q8H  . ibuprofen  800 mg Oral TID  . lamoTRIgine  200 mg Oral Daily  . pantoprazole  40 mg Oral Q1200  . piperacillin-tazobactam (ZOSYN)  IV  3.375 g Intravenous Q8H  . sodium chloride      . traZODone  200 mg Oral QHS  . vancomycin  1,000 mg Intravenous Q8H  . DISCONTD: vancomycin  1,000 mg Intravenous Q12H   Assessment: 36yo female with cellulitis of chin.  Good renal fxn.  Trough below goal, excellent vancomycin clearance.  Goal of Therapy:  Vancomycin trough level 10-15 mcg/ml  Plan: Increase Vancomycin to 1gm iv q8hrs Check trough weekly while on Vancomycin Zosyn 3.375gm iv q8hrs (4hr infusion) Monitor labs, renal fxn, and cultures per protocol  Margo Aye, Waunetta Riggle A 08/20/2012,10:43 AM

## 2012-08-21 LAB — CULTURE, ROUTINE-ABSCESS

## 2012-08-21 MED ORDER — CLINDAMYCIN HCL 150 MG PO CAPS
300.0000 mg | ORAL_CAPSULE | Freq: Three times a day (TID) | ORAL | Status: DC
  Administered 2012-08-21: 300 mg via ORAL
  Filled 2012-08-21: qty 2

## 2012-08-21 MED ORDER — OXYCODONE-ACETAMINOPHEN 5-325 MG PO TABS: 1.0000 | ORAL_TABLET | Freq: Four times a day (QID) | ORAL | Status: AC | PRN

## 2012-08-21 MED ORDER — FERROUS SULFATE 325 (65 FE) MG PO TABS: 325.0000 mg | ORAL_TABLET | Freq: Every day | ORAL | Status: DC

## 2012-08-21 MED ORDER — CLINDAMYCIN HCL 300 MG PO CAPS: 300.0000 mg | ORAL_CAPSULE | Freq: Three times a day (TID) | ORAL | Status: AC

## 2012-08-21 NOTE — Care Management Note (Addendum)
    Page 1 of 1   08/21/2012     3:51:09 PM   CARE MANAGEMENT NOTE 08/21/2012  Patient:  Brittany [name], Brittany [name]   Account Number:  [id number]  Date Initiated:  08/21/2012  Documentation initiated by:  Rosemary Holms  Subjective/Objective Assessment:   Pt lives at home with parents. Admitted with facial abscess.     Action/Plan:   DC home with po antibiotics.   Anticipated DC Date:  08/21/2012   Anticipated DC Plan:  HOME/SELF CARE      DC Planning Services  CM consult      Choice offered to / List presented to:             Status of service:  Completed, signed off Medicare Important Message given?   (If response is "NO", the following Medicare IM given date fields will be blank) Date Medicare IM given:   Date Additional Medicare IM given:    Discharge Disposition:  HOME/SELF CARE  Per UR Regulation:    If discussed at Long Length of Stay Meetings, dates discussed:    Comments:  08/21/12 1300 Rembert Browe Leanord Hawking RN BSN CM Financial Assistance approved by [employer] for indigent medication. [employer] prepared and approved for antibiotic. Given to pt.

## 2012-08-21 NOTE — Progress Notes (Signed)
Pt provided with d/c instructions, prescriptions, f/u information,  a voucher for her clindamycin, and a note for her classes stating admission and d/c dates. No questions at this time. I escorted pt to main entrance via wheelchair, her mother met [place] and was driving pt home. IV d/c. Sheryn Bison

## 2012-08-21 NOTE — Discharge Summary (Signed)
Physician Discharge Summary  Brittany [name] ZOX:[id number] DOB: Jun 08, 1976 DOA: 08/18/2012  PCP: Cassell Smiles., MD  Admit date: 08/18/2012 Discharge date: 08/21/2012  Recommendations for Outpatient Follow-up:  1. CBC in 6 weeks for Fe def anemia  Discharge Diagnoses:  Active Problems:  Cellulitis  Pain  Bipolar 1 disorder   Discharge Condition: improved  Diet recommendation: regular  Filed Weights   08/18/12 1033 08/18/12 1654 08/19/12 0629  Weight: 81.647 kg (180 lb) 87.6 kg (193 lb 2 oz) 88.2 kg (194 lb 7.1 oz)    History of present illness:  Brittany [name] is a 36 y.o. female comes in with the above symptoms which have been present for the last 3 days or so. She noticed a boil on her chin and she picked at it. Unfortunately this probably caused the subsequent cellulitis that she now has. She has been feeling feverish. She is able to eat and drink despite her lower mouth being swollen. She has not had any rigors. She is not diabetic.  She is now being referred for admission as it was felt that outpatient antibiotics would not resolve her problems.   Hospital Course:  1. Cellulitis of chin- vanc/zosyn 3 days, change over to PO abx- staph a.  draining, no fevers, eating better, less swelling, given solumedrol x 1 on 8/28 2. Pain- transition to PO medications 3. Bipolar 1- continue home medications 4. Anemia- prob Fe def from menstruation- add Fe   Discharge Exam: Filed Vitals:   08/21/12 0804  BP:   Pulse: 63  Temp:   Resp:    Filed Vitals:   08/20/12 1614 08/20/12 2238 08/21/12 0407 08/21/12 0804  BP:  108/76 119/79   Pulse: 98 78 83 63  Temp:  97.4 F (36.3 C) 98.2 F (36.8 C)   TempSrc:  Oral Oral   Resp:  15 18   Height:      Weight:      SpO2: 97% 97% 95% 98%    General: A+Ox3, NAD Cardiovascular: rrr Respiratory: clear Skin: on chin decreased redness and swelling, open and draining some purulent material, no flucuations  Discharge  Instructions  Discharge Orders    Future Orders Please Complete By Expires   Diet general      Increase activity slowly      Discharge instructions      Comments:   Warm compresses to affected area 3x/day Cover with neosporin and a band-aid while healing Wash hands before and after touching wound   Driving Restrictions      Comments:   No driving while taking pain medications   Call MD for:  temperature >100.4      Call MD for:      Scheduling Instructions:   Worsening of wound area     Medication List  As of 08/21/2012 12:22 PM   TAKE these medications         busPIRone 10 MG tablet   Commonly known as: BUSPAR   Take 10 mg by mouth 2 (two) times daily.      clindamycin 300 MG capsule   Commonly known as: CLEOCIN   Take 1 capsule (300 mg total) by mouth every 8 (eight) hours.      clonazePAM 0.5 MG tablet   Commonly known as: KLONOPIN   Take 0.5 mg by mouth 3 (three) times daily as needed. For anxiety      CRYSELLE-28 0.3-30 MG-MCG tablet   Generic drug: norgestrel-ethinyl estradiol   Take 1 tablet  by mouth daily.      DULoxetine 60 MG capsule   Commonly known as: CYMBALTA   Take 60 mg by mouth daily. For anxiety and depression and pain.      ferrous sulfate 325 (65 FE) MG tablet   Take 1 tablet (325 mg total) by mouth daily with breakfast.      ibuprofen 200 MG tablet   Commonly known as: ADVIL,MOTRIN   Take 800 mg by mouth every 6 (six) hours as needed. For leg pain      oxyCODONE-acetaminophen 5-325 MG per tablet   Commonly known as: PERCOCET/ROXICET   Take 1-2 tablets by mouth every 6 (six) hours as needed.      traZODone 100 MG tablet   Commonly known as: DESYREL   Take 200 mg by mouth at bedtime.           Follow-up Information    Follow up with Cassell Smiles., MD in 1 week.   Contact information:   [address] [place] [address] [phone]           The results of significant diagnostics from  this hospitalization (including imaging, microbiology, ancillary and laboratory) are listed below for reference.    Significant Diagnostic Studies: No results found.  Microbiology: Recent Results (from the past 240 hour(s))  CULTURE, ROUTINE-ABSCESS     Status: Normal   Collection Time   08/18/12 12:44 PM      Component Value Range Status Comment   Specimen Description ABSCESS CHIN   Final    Special Requests NONE   Final    Gram Stain     Final    Value: FEW WBC PRESENT,BOTH PMN AND MONONUCLEAR     NO SQUAMOUS EPITHELIAL CELLS SEEN     RARE GRAM POSITIVE COCCI     IN PAIRS   Culture     Final    Value: ABUNDANT STAPHYLOCOCCUS AUREUS     Note: RIFAMPIN AND GENTAMICIN SHOULD NOT BE USED AS SINGLE DRUGS FOR TREATMENT OF STAPH INFECTIONS.   Report Status 08/21/2012 FINAL   Final    Organism ID, Bacteria STAPHYLOCOCCUS AUREUS   Final   MRSA PCR SCREENING     Status: Normal   Collection Time   08/18/12  5:23 PM      Component Value Range Status Comment   MRSA by PCR NEGATIVE  NEGATIVE Final      Labs: Basic Metabolic Panel:  Lab 08/20/12 8657 08/19/12 0507 08/18/12 1239  NA 141 138 136  K 4.1 3.7 3.6  CL 104 101 101  CO2 27 28 26   GLUCOSE 175* 110* 92  BUN 6 3* 4*  CREATININE 0.55 0.52 0.51  CALCIUM 9.2 9.0 9.4  MG -- -- --  PHOS -- -- --   Liver Function Tests:  Lab 08/19/12 0507 08/18/12 1239  AST 9 11  ALT 8 9  ALKPHOS 64 68  BILITOT 0.2* 0.2*  PROT 7.2 7.6  ALBUMIN 3.1* 3.4*   No results found for this basename: LIPASE:5,AMYLASE:5 in the last 168 hours No results found for this basename: AMMONIA:5 in the last 168 hours CBC:  Lab 08/20/12 0441 08/19/12 0507 08/18/12 1239  WBC 9.3 7.1 11.6*  NEUTROABS -- -- 9.4*  HGB 9.9* 9.9* 10.5*  HCT 32.0* 32.1* 33.4*  MCV 78.6 78.7 77.5*  PLT 418* 361 426*   Cardiac Enzymes: No results found for this basename: CKTOTAL:5,CKMB:5,CKMBINDEX:5,TROPONINI:5 in the last 168 hours BNP: BNP (last  3 results) No results  found for this basename: PROBNP:3 in the last 8760 hours CBG: No results found for this basename: GLUCAP:5 in the last 168 hours  Time coordinating discharge: 45 minutes  Signed:  Marlin Canary  [facility] 08/21/2012, 12:22 PM

## 2012-09-22 ENCOUNTER — Other Ambulatory Visit (HOSPITAL_COMMUNITY)
Admission: RE | Admit: 2012-09-22 | Discharge: 2012-09-22 | Disposition: A | Discharge status: Home / Self Care | Payer: Medicaid Other | Source: Ambulatory Visit | Attending: Obstetrics & Gynecology | Admitting: Obstetrics & Gynecology

## 2012-09-22 DIAGNOSIS — Z01419 Encounter for gynecological examination (general) (routine) without abnormal findings: Secondary | ICD-10-CM | POA: Insufficient documentation

## 2012-10-21 ENCOUNTER — Inpatient Hospital Stay (HOSPITAL_COMMUNITY): Admission: RE | Admit: 2012-10-21 | Payer: Self-pay | Source: Ambulatory Visit

## 2012-10-28 ENCOUNTER — Encounter (HOSPITAL_COMMUNITY): Payer: Self-pay | Admitting: Pharmacy Technician

## 2012-10-28 ENCOUNTER — Other Ambulatory Visit (HOSPITAL_COMMUNITY): Payer: Self-pay | Admitting: Obstetrics & Gynecology

## 2012-10-28 NOTE — Patient Instructions (Addendum)
Your procedure is scheduled on:  11/04/12  Report to [facility] at 09:45 AM.  Call this number if you have problems the morning of surgery: [phone]   Remember:   Do not eat or drink:After Midnight.  Take these medicines the morning of surgery with A SIP OF WATER: Cymbalta and Clonazepam only if needed.   Do not wear jewelry, make-up or nail polish.  Do not wear lotions, powders, or perfumes.   Do not shave 48 hours prior to surgery. Men may shave face and neck.  Do not bring valuables to the hospital.  Contacts, dentures or bridgework may not be worn into surgery.  Leave suitcase in the car. After surgery it may be brought to your room.  For patients admitted to the hospital, checkout time is 11:00 AM the day of discharge.   Patients discharged the day of surgery will not be allowed to drive home.   Special Instructions: Shower using CHG 2 nights before surgery and the night before surgery.  If you shower the day of surgery use CHG.  Use special wash - you have one bottle of CHG for all showers.  You should use approximately 1/3 of the bottle for each shower.   Please read over the following fact sheets that you were given: Pain Booklet, MRSA Information, Surgical Site Infection Prevention, Anesthesia Post-op Instructions and Care and Recovery After Surgery    Laparoscopic Tubal Ligation Laparoscopic tubal ligation is a procedure that closes the fallopian tubes at a time other than right after childbirth. By closing the fallopian tubes, the eggs that are released from the ovaries cannot enter the uterus and sperm cannot reach the egg. Tubal ligation is also known as getting your "tubes tied." Tubal ligation is done so you will not be able to get pregnant or have a baby.  Although this procedure may be reversed, it should be considered permanent and irreversible. If you want to have future pregnancies, you should not have this procedure.  LET YOUR CAREGIVER KNOW ABOUT:  Allergies to  food or medicine.  Medicines taken, including vitamins, herbs, eyedrops, over-the-counter medicines, and creams.  Use of steroids (by mouth or creams).  Previous problems with numbing medicines.  History of bleeding problems or blood clots.  Any recent colds or infections.  Previous surgery.  Other health problems, including diabetes and kidney problems.  Possibility of pregnancy, if this applies.  Any past pregnancies. RISKS AND COMPLICATIONS   Infection.  Bleeding.  Injury to surrounding organs.  Anesthetic side effects.  Failure of the procedure.  Ectopic pregnancy.  Future regret about having the procedure done. BEFORE THE PROCEDURE  Do not take aspirin or blood thinners a week before the procedure or as directed. This can cause bleeding.  Do not eat or drink anything 6 to 8 hours before the procedure. PROCEDURE   You may be given a medicine to help you relax (sedative) before the procedure. You will be given a medicine to make you sleep (general anesthetic) during the procedure.  A tube will be put down your throat to help your breath while under general anesthesia.  Two small cuts (incisions) are made in the lower abdominal area and near the belly button.  Your abdominal area will be inflated with a safe gas (carbon dioxide). This helps give the surgeon room to operate, visualize, and helps the surgeon avoid other organs.  A thin, lighted tube (laparoscope) with a camera attached is inserted into your abdomen through one of  the incisions near the belly button. Other small instruments are also inserted through the other abdominal incision.  The fallopian tubes are located and are either blocked with a ring, clip, or are burned (cauterized).  After the fallopian tubes are blocked, the gas is released from the abdomen.  The incisions will be closed with stitches (sutures), and a bandage may be placed over the incisions. AFTER THE PROCEDURE   You will rest  in a recovery room for 1 4 hours until you are stable and doing well.  You will also have some mild abdominal discomfort for 3 7 days. You will be given pain medicine to ease any discomfort.  As long as there are no problems, you may be allowed to go home. Someone will need to drive you home and be with you for at least 24 hours once home.  You may have some mild discomfort in the throat. This is from the tube placed in your throat while you were sleeping.  You may experience discomfort in the shoulder area from some trapped air between the liver and diaphragm. This sensation is normal and will slowly go away on its own. Document Released: 03/17/2001 Document Revised: 06/09/2012 Document Reviewed: 03/21/2012 [facility] Patient Information 2013 [place], [place].    PATIENT INSTRUCTIONS POST-ANESTHESIA  IMMEDIATELY FOLLOWING SURGERY:  Do not drive or operate machinery for the first twenty four hours after surgery.  Do not make any important decisions for twenty four hours after surgery or while taking narcotic pain medications or sedatives.  If you develop intractable nausea and vomiting or a severe headache please notify your doctor immediately.  FOLLOW-UP:  Please make an appointment with your surgeon as instructed. You do not need to follow up with anesthesia unless specifically instructed to do so.  WOUND CARE INSTRUCTIONS (if applicable):  Keep a dry clean dressing on the anesthesia/puncture wound site if there is drainage.  Once the wound has quit draining you may leave it open to air.  Generally you should leave the bandage intact for twenty four hours unless there is drainage.  If the epidural site drains for more than 36-48 hours please call the anesthesia department.  QUESTIONS?:  Please feel free to call your physician or the hospital operator if you have any questions, and they will be happy to assist you.

## 2012-10-29 ENCOUNTER — Encounter (HOSPITAL_COMMUNITY): Payer: Self-pay

## 2012-10-29 ENCOUNTER — Encounter (HOSPITAL_COMMUNITY)
Admission: RE | Admit: 2012-10-29 | Discharge: 2012-10-29 | Disposition: A | Discharge status: Home / Self Care | Payer: Medicaid Other | Source: Ambulatory Visit | Attending: Obstetrics & Gynecology | Admitting: Obstetrics & Gynecology

## 2012-10-29 LAB — COMPREHENSIVE METABOLIC PANEL
ALT: 14 U/L (ref 0–35)
AST: 15 U/L (ref 0–37)
Alkaline Phosphatase: 66 U/L (ref 39–117)
CO2: 26 mEq/L (ref 19–32)
Chloride: 104 mEq/L (ref 96–112)
GFR calc Af Amer: >90 mL/min (ref >90)
GFR calc non Af Amer: >90 mL/min (ref >90)
Glucose, Bld: 85 mg/dL (ref 70–99)
Potassium: 3.9 mEq/L (ref 3.5–5.1)
Sodium: 141 mEq/L (ref 135–145)

## 2012-10-29 LAB — CBC
Hemoglobin: 11.8 g/dL — ABNORMAL LOW (ref 12.0–15.0)
MCH: 24.6 pg — ABNORMAL LOW (ref 26.0–34.0)
Platelets: 489 10*3/uL — ABNORMAL HIGH (ref 150–400)
RBC: 4.79 MIL/uL (ref 3.87–5.11)
WBC: 6.3 10*3/uL (ref 4.0–10.5)

## 2012-10-29 LAB — SURGICAL PCR SCREEN
MRSA, PCR: NEGATIVE
Staphylococcus aureus: NEGATIVE

## 2012-11-04 ENCOUNTER — Encounter (HOSPITAL_COMMUNITY): Payer: Self-pay | Admitting: Anesthesiology

## 2012-11-04 ENCOUNTER — Encounter (HOSPITAL_COMMUNITY)
Admission: RE | Disposition: A | Discharge status: Home / Self Care | Payer: Self-pay | Source: Ambulatory Visit | Attending: Obstetrics & Gynecology

## 2012-11-04 ENCOUNTER — Ambulatory Visit (HOSPITAL_COMMUNITY)
Admission: RE | Admit: 2012-11-04 | Discharge: 2012-11-04 | Disposition: A | Discharge status: Home / Self Care | Payer: Medicaid Other | Source: Ambulatory Visit | Attending: Obstetrics & Gynecology | Admitting: Obstetrics & Gynecology

## 2012-11-04 ENCOUNTER — Encounter (HOSPITAL_COMMUNITY): Payer: Self-pay | Admitting: *Deleted

## 2012-11-04 ENCOUNTER — Ambulatory Visit (HOSPITAL_COMMUNITY): Payer: Medicaid Other | Admitting: Anesthesiology

## 2012-11-04 DIAGNOSIS — Z9851 Tubal ligation status: Secondary | ICD-10-CM

## 2012-11-04 DIAGNOSIS — Z01812 Encounter for preprocedural laboratory examination: Secondary | ICD-10-CM | POA: Insufficient documentation

## 2012-11-04 DIAGNOSIS — Z302 Encounter for sterilization: Secondary | ICD-10-CM | POA: Insufficient documentation

## 2012-11-04 HISTORY — PX: LAPAROSCOPIC TUBAL LIGATION: SHX1937

## 2012-11-04 SURGERY — LIGATION, FALLOPIAN TUBE, LAPAROSCOPIC
Anesthesia: General | Site: Abdomen | Laterality: Bilateral | Wound class: Clean

## 2012-11-04 MED ORDER — MIDAZOLAM HCL 2 MG/2ML IJ SOLN
1.0000 mg | INTRAMUSCULAR | Status: DC | PRN
  Administered 2012-11-04 (×2): 2 mg via INTRAVENOUS

## 2012-11-04 MED ORDER — LIDOCAINE HCL (PF) 1 % IJ SOLN
INTRAMUSCULAR | Status: AC
  Filled 2012-11-04: qty 5

## 2012-11-04 MED ORDER — PROPOFOL 10 MG/ML IV EMUL
INTRAVENOUS | Status: DC | PRN
  Administered 2012-11-04: 150 mg via INTRAVENOUS

## 2012-11-04 MED ORDER — LACTATED RINGERS IV SOLN
INTRAVENOUS | Status: DC
  Administered 2012-11-04 (×2): 1000 mL via INTRAVENOUS

## 2012-11-04 MED ORDER — ONDANSETRON HCL 4 MG/2ML IJ SOLN: 4.0000 mg | Freq: Once | INTRAMUSCULAR | Status: DC | PRN

## 2012-11-04 MED ORDER — GLYCOPYRROLATE 0.2 MG/ML IJ SOLN
INTRAMUSCULAR | Status: DC | PRN
  Administered 2012-11-04: 0.6 mg via INTRAVENOUS

## 2012-11-04 MED ORDER — CEFAZOLIN SODIUM-DEXTROSE 2-3 GM-% IV SOLR
INTRAVENOUS | Status: AC
  Filled 2012-11-04: qty 50

## 2012-11-04 MED ORDER — ONDANSETRON HCL 8 MG PO TABS: 8.0000 mg | ORAL_TABLET | Freq: Three times a day (TID) | ORAL | Status: DC | PRN

## 2012-11-04 MED ORDER — ONDANSETRON HCL 4 MG/2ML IJ SOLN
INTRAMUSCULAR | Status: AC
  Filled 2012-11-04: qty 2

## 2012-11-04 MED ORDER — NEOSTIGMINE METHYLSULFATE 1 MG/ML IJ SOLN
INTRAMUSCULAR | Status: DC | PRN
  Administered 2012-11-04: 4 mg via INTRAVENOUS

## 2012-11-04 MED ORDER — CEFAZOLIN SODIUM-DEXTROSE 2-3 GM-% IV SOLR: 2.0000 g | INTRAVENOUS | Status: DC

## 2012-11-04 MED ORDER — ONDANSETRON HCL 4 MG/2ML IJ SOLN
4.0000 mg | Freq: Once | INTRAMUSCULAR | Status: AC
  Administered 2012-11-04: 4 mg via INTRAVENOUS

## 2012-11-04 MED ORDER — PROPOFOL 10 MG/ML IV EMUL
INTRAVENOUS | Status: AC
  Filled 2012-11-04: qty 20

## 2012-11-04 MED ORDER — OXYCODONE-ACETAMINOPHEN 7.5-500 MG PO TABS: 1.0000 | ORAL_TABLET | Freq: Four times a day (QID) | ORAL | Status: DC | PRN

## 2012-11-04 MED ORDER — CEFAZOLIN SODIUM-DEXTROSE 2-3 GM-% IV SOLR
INTRAVENOUS | Status: DC | PRN
  Administered 2012-11-04: 2 g via INTRAVENOUS

## 2012-11-04 MED ORDER — GLYCOPYRROLATE 0.2 MG/ML IJ SOLN
INTRAMUSCULAR | Status: AC
  Filled 2012-11-04: qty 3

## 2012-11-04 MED ORDER — MIDAZOLAM HCL 2 MG/2ML IJ SOLN
INTRAMUSCULAR | Status: AC
  Filled 2012-11-04: qty 2

## 2012-11-04 MED ORDER — FENTANYL CITRATE 0.05 MG/ML IJ SOLN
INTRAMUSCULAR | Status: DC | PRN
  Administered 2012-11-04 (×2): 50 ug via INTRAVENOUS
  Administered 2012-11-04: 100 ug via INTRAVENOUS

## 2012-11-04 MED ORDER — ROCURONIUM BROMIDE 50 MG/5ML IV SOLN
INTRAVENOUS | Status: AC
  Filled 2012-11-04: qty 1

## 2012-11-04 MED ORDER — KETOROLAC TROMETHAMINE 10 MG PO TABS: 10.0000 mg | ORAL_TABLET | Freq: Three times a day (TID) | ORAL | Status: DC | PRN

## 2012-11-04 MED ORDER — FENTANYL CITRATE 0.05 MG/ML IJ SOLN
INTRAMUSCULAR | Status: AC
  Filled 2012-11-04: qty 2

## 2012-11-04 MED ORDER — KETOROLAC TROMETHAMINE 30 MG/ML IJ SOLN
INTRAMUSCULAR | Status: AC
  Filled 2012-11-04: qty 1

## 2012-11-04 MED ORDER — 0.9 % SODIUM CHLORIDE (POUR BTL) OPTIME
TOPICAL | Status: DC | PRN
  Administered 2012-11-04: 1000 mL

## 2012-11-04 MED ORDER — FENTANYL CITRATE 0.05 MG/ML IJ SOLN
25.0000 ug | INTRAMUSCULAR | Status: DC | PRN
  Administered 2012-11-04 (×2): 50 ug via INTRAVENOUS

## 2012-11-04 MED ORDER — LACTATED RINGERS IV SOLN
INTRAVENOUS | Status: DC | PRN
  Administered 2012-11-04 (×2): via INTRAVENOUS

## 2012-11-04 MED ORDER — KETOROLAC TROMETHAMINE 30 MG/ML IJ SOLN
30.0000 mg | Freq: Once | INTRAMUSCULAR | Status: AC
  Administered 2012-11-04: 30 mg via INTRAVENOUS

## 2012-11-04 MED ORDER — ROCURONIUM BROMIDE 100 MG/10ML IV SOLN
INTRAVENOUS | Status: DC | PRN
  Administered 2012-11-04: 30 mg via INTRAVENOUS

## 2012-11-04 SURGICAL SUPPLY — 32 items
BAG HAMPER (MISCELLANEOUS) ×2 IMPLANT
BLADE SURG SZ11 CARB STEEL (BLADE) ×1 IMPLANT
CLOTH BEACON ORANGE TIMEOUT ST (SAFETY) ×2 IMPLANT
COVER LIGHT HANDLE STERIS (MISCELLANEOUS) ×4 IMPLANT
ELECT REM PT RETURN 9FT ADLT (ELECTROSURGICAL) ×2
ELECTRODE REM PT RTRN 9FT ADLT (ELECTROSURGICAL) ×1 IMPLANT
GLOVE BIOGEL PI IND STRL 7.0 (GLOVE) IMPLANT
GLOVE BIOGEL PI IND STRL 8 (GLOVE) ×1 IMPLANT
GLOVE BIOGEL PI INDICATOR 7.0 (GLOVE) ×2
GLOVE BIOGEL PI INDICATOR 8 (GLOVE) ×1
GLOVE ECLIPSE 8.0 STRL XLNG CF (GLOVE) ×2 IMPLANT
GLOVE EXAM NITRILE MD LF STRL (GLOVE) ×1 IMPLANT
GLOVE SS BIOGEL STRL SZ 6.5 (GLOVE) IMPLANT
GLOVE SUPERSENSE BIOGEL SZ 6.5 (GLOVE) ×1
GOWN STRL REIN XL XLG (GOWN DISPOSABLE) ×4 IMPLANT
INST SET LAPROSCOPIC GYN AP (KITS) ×2 IMPLANT
KIT ROOM TURNOVER AP CYSTO (KITS) ×2 IMPLANT
MANIFOLD NEPTUNE II (INSTRUMENTS) ×2 IMPLANT
NDL INSUFFLATION 14GA 120MM (NEEDLE) ×1 IMPLANT
NEEDLE INSUFFLATION 14GA 120MM (NEEDLE) ×2 IMPLANT
PACK PERI GYN (CUSTOM PROCEDURE TRAY) ×2 IMPLANT
PAD ARMBOARD 7.5X6 YLW CONV (MISCELLANEOUS) ×2 IMPLANT
SET BASIN LINEN APH (SET/KITS/TRAYS/PACK) ×2 IMPLANT
SOLUTION ANTI FOG 6CC (MISCELLANEOUS) ×2 IMPLANT
SPONGE GAUZE 2X2 8PLY STRL LF (GAUZE/BANDAGES/DRESSINGS) ×2 IMPLANT
STAPLER VISISTAT 35W (STAPLE) ×2 IMPLANT
SUT VICRYL 0 UR6 27IN ABS (SUTURE) ×2 IMPLANT
SYRINGE 10CC LL (SYRINGE) IMPLANT
TAPE CLOTH SURG 4X10 WHT LF (GAUZE/BANDAGES/DRESSINGS) ×1 IMPLANT
TROCAR Z-THRD FIOS HNDL 11X100 (TROCAR) ×2 IMPLANT
TUBING INSUFFLATION HIGH FLOW (TUBING) ×2 IMPLANT
WARMER LAPAROSCOPE (MISCELLANEOUS) ×2 IMPLANT

## 2012-11-04 NOTE — Preoperative (Signed)
Beta Blockers   Reason not to administer Beta Blockers:Not Applicable 

## 2012-11-04 NOTE — H&P (Signed)
Brittany [name] is an 36 y.o. female. Multiparous female desires a permanent solution to her contraception.  She understands the no permanent alternatives and the failure rate associated with lap btl but will proceed.  Patient's last menstrual period was 10/22/2012.    Past Medical History  Diagnosis Date  . Bipolar 1 disorder     starting to hear voices  . MVP (mitral valve prolapse)   . Seizures 2009    stress induced    Past Surgical History  Procedure Date  . Nasal sinus surgery     History reviewed. No pertinent family history.  Social History:  reports that she has quit smoking. Her smoking use included Cigarettes. She quit after 10 years of use. She does not have any smokeless tobacco history on file. She reports that she does not drink alcohol or use illicit drugs.  Allergies:  Allergies  Allergen Reactions  . Vicoprofen (Hydrocodone-Ibuprofen) Nausea Only  . Morphine And Related Itching    Prescriptions prior to admission  Medication Sig Dispense Refill  . Aspirin-Salicylamide-Caffeine (BC FAST PAIN RELIEF) [phone] MG PACK Take 1 Package by mouth every 4 (four) hours as needed. Pain      . busPIRone (BUSPAR) 10 MG tablet Take 10 mg by mouth 2 (two) times daily.      . clonazePAM (KLONOPIN) 0.5 MG tablet Take 0.5 mg by mouth 3 (three) times daily as needed. For anxiety      . DULoxetine (CYMBALTA) 60 MG capsule Take 60 mg by mouth daily. For anxiety and depression and pain.      . ferrous sulfate 325 (65 FE) MG tablet Take 1 tablet (325 mg total) by mouth daily with breakfast.  30 tablet  0  . ibuprofen (ADVIL,MOTRIN) 200 MG tablet Take 800 mg by mouth every 6 (six) hours as needed. For leg pain      . lamoTRIgine (LAMICTAL) 150 MG tablet Take 150 mg by mouth daily.      . norgestrel-ethinyl estradiol (CRYSELLE-28) 0.3-30 MG-MCG tablet Take 1 tablet by mouth daily.      . traZODone (DESYREL) 100 MG tablet Take 200 mg by mouth at bedtime.      . diphenhydrAMINE  (BENADRYL) 25 MG tablet Take 25 mg by mouth every 6 (six) hours as needed. Allergies        ROS  Review of Systems  Constitutional: Negative for fever, chills, weight loss, malaise/fatigue and diaphoresis.  HENT: Negative for hearing loss, ear pain, nosebleeds, congestion, sore throat, neck pain, tinnitus and ear discharge.   Eyes: Negative for blurred vision, double vision, photophobia, pain, discharge and redness.  Respiratory: Negative for cough, hemoptysis, sputum production, shortness of breath, wheezing and stridor.   Cardiovascular: Negative for chest pain, palpitations, orthopnea, claudication, leg swelling and PND.  Gastrointestinal: Negative for abdominal pain. Negative for heartburn, nausea, vomiting, diarrhea, constipation, blood in stool and melena.  Genitourinary: Negative for dysuria, urgency, frequency, hematuria and flank pain.  Musculoskeletal: Negative for myalgias, back pain, joint pain and falls.  Skin: Negative for itching and rash.  Neurological: Negative for dizziness, tingling, tremors, sensory change, speech change, focal weakness, seizures, loss of consciousness, weakness and headaches.  Endo/Heme/Allergies: Negative for environmental allergies and polydipsia. Does not bruise/bleed easily.  Psychiatric/Behavioral: Negative for depression, suicidal ideas, hallucinations, memory loss and substance abuse. The patient is not nervous/anxious and does not have insomnia.     Last menstrual period 10/22/2012. Physical Exam Physical Exam  Vitals reviewed. Constitutional: She is oriented to  person, place, and time. She appears well-developed and well-nourished.  HENT:  Head: Normocephalic and atraumatic.  Right Ear: External ear normal.  Left Ear: External ear normal.  Nose: Nose normal.  Mouth/Throat: Oropharynx is clear and moist.  Eyes: Conjunctivae and EOM are normal. Pupils are equal, round, and reactive to light. Right eye exhibits no discharge. Left eye exhibits  no discharge. No scleral icterus.  Neck: Normal range of motion. Neck supple. No tracheal deviation present. No thyromegaly present.  Cardiovascular: Normal rate, regular rhythm, normal heart sounds and intact distal pulses.  Exam reveals no gallop and no friction rub.   No murmur heard. Respiratory: Effort normal and breath sounds normal. No respiratory distress. She has no wheezes. She has no rales. She exhibits no tenderness.  GI: Soft. Bowel sounds are normal. She exhibits no distension and no mass. There is tenderness. There is no rebound and no guarding.  Genitourinary:       Vulva is normal without lesions Vagina is pink moist without discharge Cervix normal in appearance and pap is normal Uterus is normal Adnexa is negative with normal sized ovaries by sonogram  Musculoskeletal: Normal range of motion. She exhibits no edema and no tenderness.  Neurological: She is alert and oriented to person, place, and time. She has normal reflexes. She displays normal reflexes. No cranial nerve deficit. She exhibits normal muscle tone. Coordination normal.  Skin: Skin is warm and dry. No rash noted. No erythema. No pallor.  Psychiatric: She has a normal mood and affect. Her behavior is normal. Judgment and thought content normal.     Recent Results (from the past 336 hour(s))  SURGICAL PCR SCREEN   Collection Time   10/29/12  8:59 AM      Component Value Range   MRSA, PCR NEGATIVE  NEGATIVE   Staphylococcus aureus NEGATIVE  NEGATIVE  CBC   Collection Time   10/29/12  9:00 AM      Component Value Range   WBC 6.3  4.0 - 10.5 K/uL   RBC 4.79  3.87 - 5.11 MIL/uL   Hemoglobin 11.8 (*) 12.0 - 15.0 g/dL   HCT 16.1  09.6 - 04.5 %   MCV 79.1  78.0 - 100.0 fL   MCH 24.6 (*) 26.0 - 34.0 pg   MCHC 31.1  30.0 - 36.0 g/dL   RDW 40.9 (*) 81.1 - 91.4 %   Platelets 489 (*) 150 - 400 K/uL  COMPREHENSIVE METABOLIC PANEL   Collection Time   10/29/12  9:00 AM      Component Value Range   Sodium 141  135  - 145 mEq/L   Potassium 3.9  3.5 - 5.1 mEq/L   Chloride 104  96 - 112 mEq/L   CO2 26  19 - 32 mEq/L   Glucose, Bld 85  70 - 99 mg/dL   BUN 6  6 - 23 mg/dL   Creatinine, Ser 7.82  0.50 - 1.10 mg/dL   Calcium 9.6  8.4 - 95.6 mg/dL   Total Protein 7.2  6.0 - 8.3 g/dL   Albumin 3.5  3.5 - 5.2 g/dL   AST 15  0 - 37 U/L   ALT 14  0 - 35 U/L   Alkaline Phosphatase 66  39 - 117 U/L   Total Bilirubin 0.1 (*) 0.3 - 1.2 mg/dL   GFR calc non Af Amer >90  >90 mL/min   GFR calc Af Amer >90  >90 mL/min  HCG, QUANTITATIVE, PREGNANCY  Collection Time   10/29/12  9:30 AM      Component Value Range   hCG, Beta Chain, Quant, S <1  <5 mIU/mL       Assessment/Plan: 1.  Multiparous female desires permanent sterilization  She understands permanent nature of the procedure as well as the failure rate of 1/300.  EURE,LUTHER H 11/04/2012, 10:55 AM

## 2012-11-04 NOTE — Anesthesia Preprocedure Evaluation (Signed)
Anesthesia Evaluation  Patient identified by MRN, date of birth, ID band Patient awake    Reviewed: Allergy & Precautions, H&P , NPO status , Patient's Chart, lab work & pertinent test results  Airway Mallampati: I      Dental  (+) Teeth Intact   Pulmonary neg pulmonary ROS,  breath sounds clear to auscultation        Cardiovascular + Valvular Problems/Murmurs MVP Rhythm:Regular Rate:Normal     Neuro/Psych Seizures - (last 2009), Well Controlled,  PSYCHIATRIC DISORDERS Bipolar Disorder    GI/Hepatic negative GI ROS,   Endo/Other    Renal/GU      Musculoskeletal   Abdominal   Peds  Hematology   Anesthesia Other Findings   Reproductive/Obstetrics                           Anesthesia Physical Anesthesia Plan  ASA: II  Anesthesia Plan: General   Post-op Pain Management:    Induction: Intravenous  Airway Management Planned: Oral ETT  Additional Equipment:   Intra-op Plan:   Post-operative Plan: Extubation in OR  Informed Consent: I have reviewed the patients History and Physical, chart, labs and discussed the procedure including the risks, benefits and alternatives for the proposed anesthesia with the patient or authorized representative who has indicated his/her understanding and acceptance.     Plan Discussed with:   Anesthesia Plan Comments:         Anesthesia Quick Evaluation

## 2012-11-04 NOTE — Anesthesia Procedure Notes (Signed)
Procedure Name: Intubation Date/Time: 11/04/2012 11:52 AM Performed by: Carolyne Littles, AMY L Pre-anesthesia Checklist: Patient identified, Patient being monitored, Timeout performed, Emergency Drugs available and Suction available Patient Re-evaluated:Patient Re-evaluated prior to inductionOxygen Delivery Method: Circle System Utilized Preoxygenation: Pre-oxygenation with 100% oxygen Intubation Type: IV induction Ventilation: Mask ventilation without difficulty Laryngoscope Size: 3 and Miller Grade View: Grade I Tube type: Oral Tube size: 7.0 mm Number of attempts: 1 Airway Equipment and Method: stylet Placement Confirmation: ETT inserted through vocal cords under direct vision,  positive ETCO2 and breath sounds checked- equal and bilateral Secured at: 21 cm Tube secured with: Tape Dental Injury: Teeth and Oropharynx as per pre-operative assessment

## 2012-11-04 NOTE — Op Note (Signed)
Preoperative Diagnosis:  Multiparous female desires permanent sterilization  Postoperative Diagnosis:  Same as above  Procedure:  Laparoscopic Bilateral Tubal Ligation  Surgeon:  Rockne Coons MD  Anaesthesia:  LMA  Findings:  Patient had normal pelvic anatomy and no intraperitoneal abnormalities.  Description of Operation:  Patient was taken to the OR and placed into supine position where she underwent LMA anaesthesia.  She was placed in the dorsal lithotomy position and prepped and draped in the usual sterile fashion.  An incision was made in the umbilicus and dissection taken down to the rectus fascia which was incised and opened.  The non bladed trocar was then placed and the peritoneal cavity was insufflated.  The above noted findings were observed.  The Kleppinger electrocautery unit was employed and both tubes were burned to no resistance and beyond in the distal ishtmic and ampullary regions, approximately a 3.5 cm segment bilaterally.  There was good hemostasis.  The fascia, peritoneum and subcutaneous tissue were closed using 0 vicryl.  The skin was closed using staples.  The patient was awakened from anaesthesia and taken to the PACU with all counts being correct x 3.  The patient received Ancef 2 gram and Toradol 30 mg IV preoperatively.  Lazaro Arms 11/04/2012 12:26 PM

## 2012-11-04 NOTE — Anesthesia Postprocedure Evaluation (Signed)
  Anesthesia Post-op Note  Patient: Brittany [name]  Procedure(s) Performed: Procedure(s) (LRB) with comments: LAPAROSCOPIC TUBAL LIGATION (Bilateral)  Patient Location: PACU  Anesthesia Type: General  Level of Consciousness: awake, alert , oriented and patient cooperative  Airway and Oxygen Therapy: Patient Spontanous Breathing and Patient connected to face mask oxygen  Post-op Pain: mild  Post-op Assessment: Post-op Vital signs reviewed, Patient's Cardiovascular Status Stable, Respiratory Function Stable, Patent Airway and No signs of Nausea or vomiting  Post-op Vital Signs: Reviewed and stable  Complications: No apparent anesthesia complications

## 2012-11-04 NOTE — Transfer of Care (Signed)
  Anesthesia Post-op Note  Patient: Brittany [name]  Procedure(s) Performed: Procedure(s) (LRB) with comments: LAPAROSCOPIC TUBAL LIGATION (Bilateral)  Patient Location: PACU  Anesthesia Type: General  Level of Consciousness: awake, alert , oriented and patient cooperative  Airway and Oxygen Therapy: Patient Spontanous Breathing and Patient connected to face mask oxygen  Post-op Pain: mild  Post-op Assessment: Post-op Vital signs reviewed, Patient's Cardiovascular Status Stable, Respiratory Function Stable, Patent Airway and No signs of Nausea or vomiting  Post-op Vital Signs: Reviewed and stable  Complications: No apparent anesthesia complications  

## 2012-11-06 ENCOUNTER — Encounter (HOSPITAL_COMMUNITY): Payer: Self-pay | Admitting: Obstetrics & Gynecology

## 2013-07-16 ENCOUNTER — Encounter: Payer: Self-pay | Admitting: Family Medicine

## 2013-10-25 ENCOUNTER — Encounter (HOSPITAL_COMMUNITY): Payer: Self-pay | Admitting: Emergency Medicine

## 2013-10-25 ENCOUNTER — Emergency Department (HOSPITAL_COMMUNITY)
Admission: EM | Admit: 2013-10-25 | Discharge: 2013-10-25 | Disposition: A | Discharge status: Home / Self Care | Payer: Medicaid Other | Attending: Emergency Medicine | Admitting: Emergency Medicine

## 2013-10-25 DIAGNOSIS — Z87891 Personal history of nicotine dependence: Secondary | ICD-10-CM | POA: Insufficient documentation

## 2013-10-25 DIAGNOSIS — L089 Local infection of the skin and subcutaneous tissue, unspecified: Secondary | ICD-10-CM | POA: Insufficient documentation

## 2013-10-25 DIAGNOSIS — G40909 Epilepsy, unspecified, not intractable, without status epilepticus: Secondary | ICD-10-CM | POA: Insufficient documentation

## 2013-10-25 DIAGNOSIS — Z8679 Personal history of other diseases of the circulatory system: Secondary | ICD-10-CM | POA: Insufficient documentation

## 2013-10-25 DIAGNOSIS — Z79899 Other long term (current) drug therapy: Secondary | ICD-10-CM | POA: Insufficient documentation

## 2013-10-25 DIAGNOSIS — F319 Bipolar disorder, unspecified: Secondary | ICD-10-CM | POA: Insufficient documentation

## 2013-10-25 MED ORDER — CLINDAMYCIN HCL 150 MG PO CAPS
300.0000 mg | ORAL_CAPSULE | Freq: Once | ORAL | Status: AC
  Administered 2013-10-25: 300 mg via ORAL
  Filled 2013-10-25: qty 2

## 2013-10-25 MED ORDER — TRAMADOL HCL 50 MG PO TABS: 50.0000 mg | ORAL_TABLET | Freq: Four times a day (QID) | ORAL | Status: DC | PRN

## 2013-10-25 MED ORDER — CLINDAMYCIN HCL 150 MG PO CAPS: 300.0000 mg | ORAL_CAPSULE | Freq: Three times a day (TID) | ORAL | Status: DC

## 2013-10-25 NOTE — ED Notes (Signed)
Pt with abscess to right upper lip since Friday

## 2013-10-25 NOTE — ED Notes (Signed)
Started out as pimple to right side upper lip Friday. Now has some swelling to right side of upper lip.

## 2013-10-28 NOTE — ED Provider Notes (Signed)
CSN: [id number]     Arrival date & time 10/25/13  1529 History   First MD Initiated Contact with Patient 10/25/13 1640     Chief Complaint  Patient presents with  . Facial Swelling   (Consider location/radiation/quality/duration/timing/severity/associated sxs/prior Treatment) HPI Comments: Brittany [name] is a 37 y.o. Female presenting with a swollen, infected right upper lip which started 3 days ago as a small "pimple" with a white center which she squeezed, obtaining a small amount of white discharge, with spreading swelling and worsened pain.  She denies fevers or chills and has had no nausea, vomiting or other complaint.  She does describe having a facial infection last year which required hospital admission.     The history is provided by the patient.    Past Medical History  Diagnosis Date  . Bipolar 1 disorder     starting to hear voices  . MVP (mitral valve prolapse)   . Seizures 2009    stress induced   Past Surgical History  Procedure Laterality Date  . Nasal sinus surgery    . Laparoscopic tubal ligation  11/04/2012    Procedure: LAPAROSCOPIC TUBAL LIGATION;  Surgeon: Lazaro Arms, MD;  Location: [facility];  Service: Gynecology;  Laterality: Bilateral;   History reviewed. No pertinent family history. History  Substance Use Topics  . Smoking status: Former Smoker -- 10 years    Types: Cigarettes  . Smokeless tobacco: Not on file  . Alcohol Use: No     Comment: 24 ounces once q 2 weeks, quit 09/15/12   OB History   Grav Para Term Preterm Abortions TAB SAB Ect Mult Living                 Review of Systems  Constitutional: Negative for fever and chills.  HENT: Negative for facial swelling.   Respiratory: Negative for shortness of breath and wheezing.   Skin: Positive for wound.  Neurological: Negative for numbness.    Allergies  Vicoprofen and Morphine and related  Home Medications   Current Outpatient Rx  Name  Route  Sig  Dispense  Refill  .  Aspirin-Salicylamide-Caffeine (BC FAST PAIN RELIEF) 650-195-33.3 MG PACK   Oral   Take 1 Package by mouth every 4 (four) hours as needed. Pain         . busPIRone (BUSPAR) 10 MG tablet   Oral   Take 10 mg by mouth 2 (two) times daily.         . clindamycin (CLEOCIN) 150 MG capsule   Oral   Take 2 capsules (300 mg total) by mouth 3 (three) times daily.   21 capsule   0   . clonazePAM (KLONOPIN) 0.5 MG tablet   Oral   Take 0.5 mg by mouth 3 (three) times daily as needed. For anxiety         . diphenhydrAMINE (BENADRYL) 25 MG tablet   Oral   Take 25 mg by mouth every 6 (six) hours as needed. Allergies         . EXPIRED: ferrous sulfate 325 (65 FE) MG tablet   Oral   Take 1 tablet (325 mg total) by mouth daily with breakfast.   30 tablet   0   . ibuprofen (ADVIL,MOTRIN) 200 MG tablet   Oral   Take 800 mg by mouth every 6 (six) hours as needed. For leg pain         . ketorolac (TORADOL) 10 MG tablet   Oral  Take 1 tablet (10 mg total) by mouth every 8 (eight) hours as needed for pain.   15 tablet   0   . lamoTRIgine (LAMICTAL) 150 MG tablet   Oral   Take 150 mg by mouth daily.         . norgestrel-ethinyl estradiol (CRYSELLE-28) 0.3-30 MG-MCG tablet   Oral   Take 1 tablet by mouth daily.         . ondansetron (ZOFRAN) 8 MG tablet   Oral   Take 1 tablet (8 mg total) by mouth every 8 (eight) hours as needed for nausea.   10 tablet   0   . oxyCODONE-acetaminophen (PERCOCET) 7.5-500 MG per tablet   Oral   Take 1-2 tablets by mouth every 6 (six) hours as needed for pain.   30 tablet   0   . traMADol (ULTRAM) 50 MG tablet   Oral   Take 1 tablet (50 mg total) by mouth every 6 (six) hours as needed for pain.   15 tablet   0   . traZODone (DESYREL) 100 MG tablet   Oral   Take 200 mg by mouth at bedtime.          BP 153/92  Pulse 82  Temp(Src) 98.6 F (37 C) (Oral)  Resp 20  Ht 5\' 4"  (1.626 m)  Wt 170 lb (77.111 kg)  BMI 29.17 kg/m2  SpO2  100% Physical Exam  Constitutional: She appears well-developed and well-nourished. No distress.  HENT:  Head: Normocephalic.  Neck: Neck supple.  Cardiovascular: Normal rate.   Pulmonary/Chest: Effort normal. She has no wheezes.  Musculoskeletal: Normal range of motion. She exhibits no edema.  Skin: Skin is warm.  Induration without erythema right upper lip with a small raised yellow scab at the right lip corner at the vermillion border.  No fluctuance,  Edema does not extend to the nose.      ED Course  Procedures (including critical care time) Labs Review Labs Reviewed - No data to display Imaging Review No results found.  EKG Interpretation   None       MDM   1. Facial infection    Scab was gently debrided,  No drainage from the punctum.  Pt was prescribed clindamycin and ultram.  Warm compresses.  Recheck by pcp tomorrow,  Returning here if unable to obtain f/u there.    Burgess Amor, PA-C 10/28/13 1333

## 2013-10-28 NOTE — ED Provider Notes (Signed)
Medical screening examination/treatment/procedure(s) were performed by non-physician practitioner and as supervising physician I was immediately available for consultation/collaboration.  EKG Interpretation   None         Salma Walrond L Rosaly Labarbera, MD 10/28/13 1528 

## 2014-01-31 ENCOUNTER — Other Ambulatory Visit: Payer: Self-pay | Admitting: Obstetrics & Gynecology

## 2014-01-31 ENCOUNTER — Encounter: Payer: Self-pay | Admitting: *Deleted

## 2016-05-05 ENCOUNTER — Encounter (HOSPITAL_COMMUNITY): Payer: Self-pay | Admitting: Emergency Medicine

## 2016-05-05 ENCOUNTER — Emergency Department (HOSPITAL_COMMUNITY)
Admission: EM | Admit: 2016-05-05 | Discharge: 2016-05-05 | Disposition: A | Discharge status: Home / Self Care | Payer: Medicaid Other | Attending: Emergency Medicine | Admitting: Emergency Medicine

## 2016-05-05 DIAGNOSIS — Z79899 Other long term (current) drug therapy: Secondary | ICD-10-CM | POA: Insufficient documentation

## 2016-05-05 DIAGNOSIS — Z87891 Personal history of nicotine dependence: Secondary | ICD-10-CM | POA: Insufficient documentation

## 2016-05-05 DIAGNOSIS — J039 Acute tonsillitis, unspecified: Secondary | ICD-10-CM | POA: Insufficient documentation

## 2016-05-05 DIAGNOSIS — Z7982 Long term (current) use of aspirin: Secondary | ICD-10-CM | POA: Insufficient documentation

## 2016-05-05 DIAGNOSIS — F319 Bipolar disorder, unspecified: Secondary | ICD-10-CM | POA: Insufficient documentation

## 2016-05-05 LAB — RAPID STREP SCREEN (MED CTR MEBANE ONLY): STREPTOCOCCUS, GROUP A SCREEN (DIRECT): NEGATIVE

## 2016-05-05 MED ORDER — OXYCODONE HCL 5 MG/5ML PO SOLN: 7.5000 mg | ORAL | Status: DC | PRN

## 2016-05-05 MED ORDER — ONDANSETRON HCL 4 MG/2ML IJ SOLN
4.0000 mg | Freq: Once | INTRAMUSCULAR | Status: AC
  Administered 2016-05-05: 4 mg via INTRAVENOUS
  Filled 2016-05-05: qty 2

## 2016-05-05 MED ORDER — CLINDAMYCIN PHOSPHATE 600 MG/50ML IV SOLN
600.0000 mg | Freq: Once | INTRAVENOUS | Status: AC
  Administered 2016-05-05: 600 mg via INTRAVENOUS
  Filled 2016-05-05: qty 50

## 2016-05-05 MED ORDER — PENICILLIN V POTASSIUM 250 MG/5ML PO SOLR: 500.0000 mg | Freq: Four times a day (QID) | ORAL | Status: DC

## 2016-05-05 MED ORDER — SODIUM CHLORIDE 0.9 % IV BOLUS (SEPSIS)
1000.0000 mL | Freq: Once | INTRAVENOUS | Status: AC
  Administered 2016-05-05: 1000 mL via INTRAVENOUS

## 2016-05-05 MED ORDER — FENTANYL CITRATE (PF) 100 MCG/2ML IJ SOLN
50.0000 ug | Freq: Once | INTRAMUSCULAR | Status: AC
  Administered 2016-05-05: 50 ug via INTRAVENOUS
  Filled 2016-05-05: qty 2

## 2016-05-05 MED ORDER — KETOROLAC TROMETHAMINE 30 MG/ML IJ SOLN
30.0000 mg | Freq: Once | INTRAMUSCULAR | Status: AC
  Administered 2016-05-05: 30 mg via INTRAVENOUS
  Filled 2016-05-05: qty 1

## 2016-05-05 MED ORDER — PREDNISOLONE 15 MG/5ML PO SYRP: 45.0000 mg | ORAL_SOLUTION | Freq: Every day | ORAL | Status: AC

## 2016-05-05 MED ORDER — METHYLPREDNISOLONE SODIUM SUCC 125 MG IJ SOLR
125.0000 mg | Freq: Once | INTRAMUSCULAR | Status: AC
  Administered 2016-05-05: 125 mg via INTRAVENOUS
  Filled 2016-05-05: qty 2

## 2016-05-05 NOTE — ED Notes (Signed)
Pt made aware to return if symptoms worsen or if any life threatening symptoms occur.   

## 2016-05-05 NOTE — Discharge Instructions (Signed)
Tonsillitis Tonsillitis is an infection of the throat. This infection causes the tonsils to become red, tender, and puffy (swollen). Tonsils are groups of tissue at the back of your throat. If bacteria caused your infection, antibiotic medicine will be given to you. Sometimes symptoms of tonsillitis can be relieved with the use of steroid medicine. If your tonsillitis is severe and happens often, you may need to get your tonsils removed (tonsillectomy). HOME CARE   Rest and sleep often.  Drink enough fluids to keep your pee (urine) clear or pale yellow.  While your throat is sore, eat soft or liquid foods like:  Soup.  Ice cream.  Instant breakfast drinks.  Eat frozen ice pops.  Gargle with a warm or cold liquid to help soothe the throat. Gargle with a water and salt mix. Mix 1/4 teaspoon of salt and 1/4 teaspoon of baking soda in 1 cup of water.  Only take medicines as told by your doctor.  If you are given medicines (antibiotics), take them as told. Finish them even if you start to feel better. GET HELP IF:  You have large, tender lumps in your neck.  You have a rash.  You cough up green, yellow-brown, or bloody fluid.  You cannot swallow liquids or food for 24 hours.  You notice that only one of your tonsils is swollen. GET HELP RIGHT AWAY IF:   You throw up (vomit).  You have a very bad headache.  You have a stiff neck.  You have chest pain.  You have trouble breathing or swallowing.  You have bad throat pain, drooling, or your voice changes.  You have bad pain not helped by medicine.  You cannot fully open your mouth.  You have redness, puffiness, or bad pain in the neck.  You have a fever. MAKE SURE YOU:   Understand these instructions.  Will watch your condition.  Will get help right away if you are not doing well or get worse.   This information is not intended to replace advice given to you by your health care provider. Make sure you discuss any  questions you have with your health care provider.   Document Released: 05/27/2008 Document Revised: 12/14/2013 Document Reviewed: 05/28/2013 Elsevier Interactive Patient Education 2016 [employer]   Gargle with salt water. Prescriptions for antibiotic, prednisone, pain medicine. Increase fluids. Return if swelling becomes worse.

## 2016-05-05 NOTE — ED Notes (Signed)
Patient c/o sore throat with difficulty swallowing. Patient reports fevers with highest temp of 103.6. Patient notable having difficulty with oral secretions and hoarse. Per patient worse on left side of throat.

## 2016-05-05 NOTE — ED Notes (Signed)
MD at bedside. 

## 2016-05-05 NOTE — ED Provider Notes (Signed)
CSN: [id number]     Arrival date & time 05/05/16  1354 History   First MD Initiated Contact with Patient 05/05/16 1433     Chief Complaint  Patient presents with  . Sore Throat     (Consider location/radiation/quality/duration/timing/severity/associated sxs/prior Treatment) HPI.Marland KitchenMarland KitchenMarland KitchenSore throat, difficulty swallowing, low-grade fever for 2 days. Patient is able to swallow liquids. Severity pain is moderate. No meningeal signs, stiff neck, neurological deficits.  Past Medical History  Diagnosis Date  . Bipolar 1 disorder (HCC)     starting to hear voices  . MVP (mitral valve prolapse)   . Seizures (HCC) 2009    stress induced   Past Surgical History  Procedure Laterality Date  . Nasal sinus surgery    . Laparoscopic tubal ligation  11/04/2012    Procedure: LAPAROSCOPIC TUBAL LIGATION;  Surgeon: Lazaro Arms, MD;  Location: [facility];  Service: Gynecology;  Laterality: Bilateral;   No family history on file. Social History  Substance Use Topics  . Smoking status: Former Smoker -- 10 years    Types: Cigarettes  . Smokeless tobacco: Never Used  . Alcohol Use: No   OB History    Gravida Para Term Preterm AB TAB SAB Ectopic Multiple Living   Review of Systems  All other systems reviewed and are negative.     Allergies  Vicoprofen and Morphine and related  Home Medications   Prior to Admission medications   Medication Sig Start Date End Date Taking? Authorizing Provider  Aspirin-Salicylamide-Caffeine (BC FAST PAIN RELIEF) 650-195-33.3 MG PACK Take 1 Package by mouth every 4 (four) hours as needed. Pain    Historical Provider, MD  busPIRone (BUSPAR) 10 MG tablet Take 10 mg by mouth 2 (two) times daily.    Historical Provider, MD  clindamycin (CLEOCIN) 150 MG capsule Take 2 capsules (300 mg total) by mouth 3 (three) times daily. 10/25/13   Burgess Amor, PA-C  clonazePAM (KLONOPIN) 0.5 MG tablet Take 0.5 mg by mouth 3 (three) times daily as needed. For anxiety     Historical Provider, MD  diphenhydrAMINE (BENADRYL) 25 MG tablet Take 25 mg by mouth every 6 (six) hours as needed. Allergies    Historical Provider, MD  ferrous sulfate 325 (65 FE) MG tablet Take 1 tablet (325 mg total) by mouth daily with breakfast. 08/21/12 08/21/13  Joseph Art, DO  ibuprofen (ADVIL,MOTRIN) 200 MG tablet Take 800 mg by mouth every 6 (six) hours as needed. For leg pain    Historical Provider, MD  ketorolac (TORADOL) 10 MG tablet Take 1 tablet (10 mg total) by mouth every 8 (eight) hours as needed for pain. 11/04/12   Lazaro Arms, MD  lamoTRIgine (LAMICTAL) 150 MG tablet Take 150 mg by mouth daily.    Historical Provider, MD  norgestrel-ethinyl estradiol (CRYSELLE-28) 0.3-30 MG-MCG tablet Take 1 tablet by mouth daily.    Historical Provider, MD  ondansetron (ZOFRAN) 8 MG tablet Take 1 tablet (8 mg total) by mouth every 8 (eight) hours as needed for nausea. 11/04/12   Lazaro Arms, MD  oxyCODONE (ROXICODONE) 5 MG/5ML solution Take 7.5 mLs (7.5 mg total) by mouth every 4 (four) hours as needed for severe pain. 05/05/16   Donnetta Hutching, MD  oxyCODONE-acetaminophen (PERCOCET) 7.5-500 MG per tablet Take 1-2 tablets by mouth every 6 (six) hours as needed for pain. 11/04/12   Lazaro Arms, MD  penicillin v potassium (VEETID) 250  MG/5ML solution Take 10 mLs (500 mg total) by mouth 4 (four) times daily. 05/05/16   Donnetta HutchingBrian Jessen Siegman, MD  prednisoLONE (PRELONE) 15 MG/5ML syrup Take 15 mLs (45 mg total) by mouth daily. 05/05/16 05/10/16  Donnetta HutchingBrian Sharran Caratachea, MD  traMADol (ULTRAM) 50 MG tablet Take 1 tablet (50 mg total) by mouth every 6 (six) hours as needed for pain. 10/25/13   Burgess AmorJulie Idol, PA-C  traZODone (DESYREL) 100 MG tablet Take 200 mg by mouth at bedtime.    Historical Provider, MD   BP 146/99 mmHg  Pulse 126  Temp(Src) 99.3 F (37.4 C) (Oral)  Resp 20  Ht 5\' 4"  (1.626 m)  Wt 163 lb (73.936 kg)  BMI 27.97 kg/m2  SpO2 100%  LMP 05/05/2016 Physical Exam  Constitutional: She is oriented to  person, place, and time. She appears well-developed and well-nourished.  HENT:  Head: Normocephalic and atraumatic.  Tonsils are red, inflamed, with exudate.  No evidence of peritonsillar abscess.  Eyes: Conjunctivae and EOM are normal. Pupils are equal, round, and reactive to light.  Neck: Normal range of motion. Neck supple.  No meningeal signs  Cardiovascular: Normal rate and regular rhythm.   Pulmonary/Chest: Effort normal and breath sounds normal.  Abdominal: Soft. Bowel sounds are normal.  Musculoskeletal: Normal range of motion.  Neurological: She is alert and oriented to person, place, and time.  Skin: Skin is warm and dry.  Psychiatric: She has a normal mood and affect. Her behavior is normal.  Nursing note and vitals reviewed.   ED Course  Procedures (including critical care time) Labs Review Labs Reviewed  RAPID STREP SCREEN (NOT AT [facility])  CULTURE, GROUP A STREP [facility])    Imaging Review No results found. I have personally reviewed and evaluated these images and lab results as part of my medical decision-making.   EKG Interpretation None      MDM   Final diagnoses:  Tonsillitis    Patient given 2 L of IV fluids, IV clindamycin, IV steroids, IV pain medicine. Strep test negative. However, patient has exudative tonsillitis. Will Rx penicillin, prednisolone, oxycodone. She understands to return if worse.    Donnetta HutchingBrian Ceasar Decandia, MD 05/08/16 [phone]

## 2016-05-08 LAB — CULTURE, GROUP A STREP (THRC)

## 2017-07-13 ENCOUNTER — Encounter (HOSPITAL_COMMUNITY): Payer: Self-pay | Admitting: Emergency Medicine

## 2017-07-13 ENCOUNTER — Emergency Department (HOSPITAL_COMMUNITY)
Admission: EM | Admit: 2017-07-13 | Discharge: 2017-07-13 | Disposition: A | Discharge status: Home / Self Care | Payer: Medicaid Other | Attending: Emergency Medicine | Admitting: Emergency Medicine

## 2017-07-13 ENCOUNTER — Emergency Department (HOSPITAL_COMMUNITY): Payer: Medicaid Other

## 2017-07-13 DIAGNOSIS — Y9301 Activity, walking, marching and hiking: Secondary | ICD-10-CM | POA: Diagnosis not present

## 2017-07-13 DIAGNOSIS — Y929 Unspecified place or not applicable: Secondary | ICD-10-CM | POA: Diagnosis not present

## 2017-07-13 DIAGNOSIS — S93601A Unspecified sprain of right foot, initial encounter: Secondary | ICD-10-CM | POA: Diagnosis not present

## 2017-07-13 DIAGNOSIS — Y999 Unspecified external cause status: Secondary | ICD-10-CM | POA: Insufficient documentation

## 2017-07-13 DIAGNOSIS — Z87891 Personal history of nicotine dependence: Secondary | ICD-10-CM | POA: Diagnosis not present

## 2017-07-13 DIAGNOSIS — S99921A Unspecified injury of right foot, initial encounter: Secondary | ICD-10-CM | POA: Diagnosis present

## 2017-07-13 DIAGNOSIS — W109XXA Fall (on) (from) unspecified stairs and steps, initial encounter: Secondary | ICD-10-CM | POA: Diagnosis not present

## 2017-07-13 NOTE — ED Provider Notes (Signed)
[facility] Provider Note   CSN: [id number] Arrival date & time: 07/13/17  1652     History   Chief Complaint Chief Complaint  Patient presents with  . Foot Pain    HPI Brittany [name] is a 41 y.o. female.  The history is provided by the patient. No language interpreter was used.  Foot Pain  This is a new problem. The current episode started more than 2 days ago. The problem occurs constantly. The problem has been gradually worsening. The symptoms are aggravated by walking. Nothing relieves the symptoms. She has tried nothing for the symptoms.  Pt twisted foot 2 days ago.   Past Medical History:  Diagnosis Date  . Bipolar 1 disorder (HCC)    starting to hear voices  . MVP (mitral valve prolapse)   . Seizures (HCC) 2009   stress induced    Patient Active Problem List   Diagnosis Date Noted  . Cellulitis 08/20/2012  . Pain 08/20/2012  . Bipolar 1 disorder (HCC) 08/20/2012  . 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 Closed left ankle fracture 05/03/2012    Past Surgical History:  Procedure Laterality Date  . LAPAROSCOPIC TUBAL LIGATION  11/04/2012   Procedure: LAPAROSCOPIC TUBAL LIGATION;  Surgeon: Lazaro ArmsLuther H Eure, MD;  Location: [facility];  Service: Gynecology;  Laterality: Bilateral;  . NASAL SINUS SURGERY      OB History    Gravida Para Term Preterm AB Living   4 4 4     4    SAB TAB Ectopic Multiple Live Births                   Home Medications    Prior to Admission medications   Medication Sig Start Date End Date Taking? Authorizing Provider  Aspirin-Salicylamide-Caffeine (BC FAST PAIN RELIEF) 650-195-33.3 MG PACK Take 1 Package by mouth every 4 (four) hours as needed. Pain    [provider]  ibuprofen (ADVIL,MOTRIN) 200 MG tablet Take 800 mg by mouth every 6 (six) hours as needed. For leg pain    [provider]  oxyCODONE (ROXICODONE) 5 MG/5ML solution Take 7.5 mLs (7.5 mg total) by mouth every 4 (four) hours as  needed for severe pain. 05/05/16   Donnetta Hutchingook, Brian, MD  penicillin v potassium (VEETID) 250 MG/5ML solution Take 10 mLs (500 mg total) by mouth 4 (four) times daily. 05/05/16   Donnetta Hutchingook, Brian, MD    Family History History reviewed. No pertinent family history.  Social History Social History  Substance Use Topics  . Smoking status: Former Smoker    Years: 10.00    Types: Cigarettes  . Smokeless tobacco: Never Used  . Alcohol use No     Allergies   Vicoprofen [hydrocodone-ibuprofen] and Morphine and related   Review of Systems Review of Systems  All other systems reviewed and are negative.    Physical Exam Updated Vital Signs BP (!) 149/100 (BP Location: Left Arm)   Pulse 91   Temp 99 F (37.2 C) (Oral)   Ht 5\' 4"  (1.626 m)   Wt 74.8 kg (165 lb)   LMP 06/19/2017   SpO2 100%   BMI 28.32 kg/m   Physical Exam  Constitutional: She appears well-developed and well-nourished.  HENT:  Head: Normocephalic.  Musculoskeletal: She exhibits tenderness.  Tender right ankle.  And right foot,  Pain with movement,  nv and ns intact  Neurological: She is alert.  Skin: Skin is warm.  Psychiatric: She has a normal  mood and affect.     ED Treatments / Results  Labs (all labs ordered are listed, but only abnormal results are displayed) Labs Reviewed - No data to display  EKG  EKG Interpretation None       Radiology Dg Ankle Complete Right  Result Date: 07/13/2017 CLINICAL DATA:  Twisting injury with swelling EXAM: RIGHT ANKLE - COMPLETE 3+ VIEW COMPARISON:  None. FINDINGS: No fracture or malalignment is seen. Ankle mortise symmetric. Minimal degenerative changes medially and laterally. IMPRESSION: No acute osseous abnormality Electronically Signed   By: Jasmine Pang M.D.   On: 07/13/2017 17:26   Dg Foot Complete Right  Result Date: 07/13/2017 CLINICAL DATA:  Right foot pain twisted the ankle with swelling EXAM: RIGHT FOOT COMPLETE - 3+ VIEW COMPARISON:  None. FINDINGS: There  is no evidence of fracture or dislocation. Mild degenerative changes at the first MTP joint. Soft tissues are unremarkable. IMPRESSION: No acute osseous abnormality. Electronically Signed   By: Jasmine Pang M.D.   On: 07/13/2017 17:25    Procedures Procedures (including critical care time)  Medications Ordered in ED Medications - No data to display   Initial Impression / Assessment and Plan / ED Course  I have reviewed the triage vital signs and the nursing notes.  Pertinent labs & imaging results that were available during my care of the patient were reviewed by me and considered in my medical decision making (see chart for details).       Final Clinical Impressions(s) / ED Diagnoses   Final diagnoses:  Sprain of right foot, initial encounter    New Prescriptions New Prescriptions   No medications on file  .aso Crutches An After Visit Summary was printed and given to the patient.    Elson Areas, [place] 07/13/17 1745    Blane Ohara, MD 07/13/17 2322

## 2017-07-13 NOTE — ED Triage Notes (Addendum)
Patient c/o right foot pain. Per patient was walking down steps when her dog ran in front of her causing her to twist her ankle inward and fall down the steps. Patient states happened 2 days ago. Swelling noted. Patient reports taking motrin and BC powder with no relief.

## 2017-07-13 NOTE — Discharge Instructions (Signed)
Return if any problems.  Ibuprofen for swelling and pain.  See your Physician for recheck if pain persist

## 2018-05-07 ENCOUNTER — Inpatient Hospital Stay (HOSPITAL_COMMUNITY)
Admission: EM | Admit: 2018-05-07 | Discharge: 2018-05-09 | DRG: 440 | Disposition: A | Discharge status: Home / Self Care | Payer: Medicaid Other | Attending: Internal Medicine | Admitting: Internal Medicine

## 2018-05-07 ENCOUNTER — Other Ambulatory Visit: Payer: Self-pay

## 2018-05-07 ENCOUNTER — Encounter (HOSPITAL_COMMUNITY): Payer: Self-pay | Admitting: Emergency Medicine

## 2018-05-07 ENCOUNTER — Emergency Department (HOSPITAL_COMMUNITY): Payer: Medicaid Other

## 2018-05-07 DIAGNOSIS — F1010 Alcohol abuse, uncomplicated: Secondary | ICD-10-CM

## 2018-05-07 DIAGNOSIS — F319 Bipolar disorder, unspecified: Secondary | ICD-10-CM | POA: Diagnosis present

## 2018-05-07 DIAGNOSIS — Z885 Allergy status to narcotic agent status: Secondary | ICD-10-CM

## 2018-05-07 DIAGNOSIS — K859 Acute pancreatitis without necrosis or infection, unspecified: Secondary | ICD-10-CM | POA: Diagnosis present

## 2018-05-07 DIAGNOSIS — E860 Dehydration: Secondary | ICD-10-CM

## 2018-05-07 DIAGNOSIS — G8929 Other chronic pain: Secondary | ICD-10-CM | POA: Diagnosis present

## 2018-05-07 DIAGNOSIS — E6609 Other obesity due to excess calories: Secondary | ICD-10-CM | POA: Diagnosis not present

## 2018-05-07 DIAGNOSIS — K852 Alcohol induced acute pancreatitis without necrosis or infection: Secondary | ICD-10-CM | POA: Diagnosis not present

## 2018-05-07 DIAGNOSIS — I10 Essential (primary) hypertension: Secondary | ICD-10-CM | POA: Diagnosis not present

## 2018-05-07 DIAGNOSIS — K219 Gastro-esophageal reflux disease without esophagitis: Secondary | ICD-10-CM | POA: Diagnosis present

## 2018-05-07 DIAGNOSIS — F419 Anxiety disorder, unspecified: Secondary | ICD-10-CM

## 2018-05-07 DIAGNOSIS — Z881 Allergy status to other antibiotic agents status: Secondary | ICD-10-CM

## 2018-05-07 DIAGNOSIS — E876 Hypokalemia: Secondary | ICD-10-CM | POA: Diagnosis not present

## 2018-05-07 DIAGNOSIS — Z79899 Other long term (current) drug therapy: Secondary | ICD-10-CM

## 2018-05-07 DIAGNOSIS — Z9851 Tubal ligation status: Secondary | ICD-10-CM

## 2018-05-07 DIAGNOSIS — I341 Nonrheumatic mitral (valve) prolapse: Secondary | ICD-10-CM | POA: Diagnosis present

## 2018-05-07 DIAGNOSIS — Z87891 Personal history of nicotine dependence: Secondary | ICD-10-CM

## 2018-05-07 DIAGNOSIS — Z6832 Body mass index (BMI) 32.0-32.9, adult: Secondary | ICD-10-CM | POA: Diagnosis not present

## 2018-05-07 DIAGNOSIS — Z8249 Family history of ischemic heart disease and other diseases of the circulatory system: Secondary | ICD-10-CM

## 2018-05-07 DIAGNOSIS — K8500 Idiopathic acute pancreatitis without necrosis or infection: Secondary | ICD-10-CM

## 2018-05-07 DIAGNOSIS — G894 Chronic pain syndrome: Secondary | ICD-10-CM | POA: Diagnosis not present

## 2018-05-07 DIAGNOSIS — E66811 Obesity, class 1: Secondary | ICD-10-CM

## 2018-05-07 HISTORY — DX: Essential (primary) hypertension: I10

## 2018-05-07 LAB — CBC
HEMATOCRIT: 42.3 % (ref 36.0–46.0)
HEMOGLOBIN: 13.1 g/dL (ref 12.0–15.0)
MCH: 26.9 pg (ref 26.0–34.0)
MCHC: 31 g/dL (ref 30.0–36.0)
MCV: 86.9 fL (ref 78.0–100.0)
Platelets: 340 10*3/uL (ref 150–400)
RBC: 4.87 MIL/uL (ref 3.87–5.11)
RDW: 15.3 % (ref 11.5–15.5)
WBC: 9.9 10*3/uL (ref 4.0–10.5)

## 2018-05-07 LAB — URINALYSIS, ROUTINE W REFLEX MICROSCOPIC
BACTERIA UA: NONE SEEN
GLUCOSE, UA: NEGATIVE mg/dL
Ketones, ur: 80 mg/dL — AB
LEUKOCYTES UA: NEGATIVE
NITRITE: NEGATIVE
PROTEIN: 100 mg/dL — AB
Specific Gravity, Urine: 1.029 (ref 1.005–1.030)
pH: 6 (ref 5.0–8.0)

## 2018-05-07 LAB — MAGNESIUM: Magnesium: 1.9 mg/dL (ref 1.7–2.4)

## 2018-05-07 LAB — COMPREHENSIVE METABOLIC PANEL
ALT: 14 U/L (ref 14–54)
ANION GAP: 12 (ref 5–15)
AST: 23 U/L (ref 15–41)
Albumin: 4.3 g/dL (ref 3.5–5.0)
Alkaline Phosphatase: 97 U/L (ref 38–126)
BILIRUBIN TOTAL: 0.9 mg/dL (ref 0.3–1.2)
BUN: 7 mg/dL (ref 6–20)
CO2: 27 mmol/L (ref 22–32)
Calcium: 9.6 mg/dL (ref 8.9–10.3)
Chloride: 95 mmol/L — ABNORMAL LOW (ref 101–111)
Creatinine, Ser: 0.61 mg/dL (ref 0.44–1.00)
Glucose, Bld: 119 mg/dL — ABNORMAL HIGH (ref 65–99)
POTASSIUM: 3.5 mmol/L (ref 3.5–5.1)
Sodium: 134 mmol/L — ABNORMAL LOW (ref 135–145)
TOTAL PROTEIN: 9 g/dL — AB (ref 6.5–8.1)

## 2018-05-07 LAB — TSH: TSH: 2.195 u[IU]/mL (ref 0.350–4.500)

## 2018-05-07 LAB — PHOSPHORUS: PHOSPHORUS: 3.7 mg/dL (ref 2.5–4.6)

## 2018-05-07 LAB — PREGNANCY, URINE: Preg Test, Ur: NEGATIVE

## 2018-05-07 LAB — LIPASE, BLOOD: Lipase: 217 U/L — ABNORMAL HIGH (ref 11–51)

## 2018-05-07 MED ORDER — HYDRALAZINE HCL 20 MG/ML IJ SOLN
10.0000 mg | Freq: Four times a day (QID) | INTRAMUSCULAR | Status: DC | PRN
  Administered 2018-05-07: 10 mg via INTRAVENOUS
  Filled 2018-05-07: qty 1

## 2018-05-07 MED ORDER — CLONIDINE HCL 0.1 MG PO TABS
0.1000 mg | ORAL_TABLET | Freq: Every day | ORAL | Status: DC
  Administered 2018-05-07 – 2018-05-09 (×3): 0.1 mg via ORAL
  Filled 2018-05-07 (×3): qty 1

## 2018-05-07 MED ORDER — ACETAMINOPHEN 500 MG PO TABS: 500.0000 mg | ORAL_TABLET | Freq: Four times a day (QID) | ORAL | Status: DC | PRN

## 2018-05-07 MED ORDER — SODIUM CHLORIDE 0.9 % IV BOLUS
1000.0000 mL | Freq: Once | INTRAVENOUS | Status: AC
  Administered 2018-05-07: 1000 mL via INTRAVENOUS

## 2018-05-07 MED ORDER — PANTOPRAZOLE SODIUM 40 MG PO TBEC
40.0000 mg | DELAYED_RELEASE_TABLET | Freq: Two times a day (BID) | ORAL | Status: DC
  Administered 2018-05-07 – 2018-05-09 (×5): 40 mg via ORAL
  Filled 2018-05-07 (×5): qty 1

## 2018-05-07 MED ORDER — VITAMIN B-1 100 MG PO TABS
100.0000 mg | ORAL_TABLET | Freq: Every day | ORAL | Status: DC
  Administered 2018-05-07 – 2018-05-09 (×3): 100 mg via ORAL
  Filled 2018-05-07 (×3): qty 1

## 2018-05-07 MED ORDER — GI COCKTAIL ~~LOC~~: 30.0000 mL | Freq: Three times a day (TID) | ORAL | Status: DC | PRN

## 2018-05-07 MED ORDER — THIAMINE HCL 100 MG/ML IJ SOLN: 100.0000 mg | Freq: Every day | INTRAMUSCULAR | Status: DC

## 2018-05-07 MED ORDER — HYDROMORPHONE HCL 1 MG/ML IJ SOLN
0.5000 mg | Freq: Once | INTRAMUSCULAR | Status: AC
Start: 2018-05-07 — End: 2018-05-07
  Administered 2018-05-07: 0.5 mg via INTRAVENOUS
  Filled 2018-05-07: qty 1

## 2018-05-07 MED ORDER — BUPRENORPHINE HCL 2 MG SL SUBL
4.0000 mg | SUBLINGUAL_TABLET | Freq: Two times a day (BID) | SUBLINGUAL | Status: DC
  Administered 2018-05-07 – 2018-05-09 (×4): 4 mg via SUBLINGUAL
  Filled 2018-05-07 (×4): qty 2

## 2018-05-07 MED ORDER — SODIUM CHLORIDE 0.9 % IV SOLN
INTRAVENOUS | Status: DC
  Administered 2018-05-07 – 2018-05-08 (×2): via INTRAVENOUS

## 2018-05-07 MED ORDER — ACETAMINOPHEN 650 MG RE SUPP: 650.0000 mg | Freq: Four times a day (QID) | RECTAL | Status: DC | PRN

## 2018-05-07 MED ORDER — LORAZEPAM 1 MG PO TABS
1.0000 mg | ORAL_TABLET | Freq: Four times a day (QID) | ORAL | Status: DC | PRN
  Administered 2018-05-08: 1 mg via ORAL
  Filled 2018-05-07: qty 1

## 2018-05-07 MED ORDER — ONDANSETRON HCL 4 MG/2ML IJ SOLN
4.0000 mg | Freq: Once | INTRAMUSCULAR | Status: AC
  Administered 2018-05-07: 4 mg via INTRAVENOUS
  Filled 2018-05-07: qty 2

## 2018-05-07 MED ORDER — ALPRAZOLAM 0.5 MG PO TABS: 0.5000 mg | ORAL_TABLET | Freq: Every evening | ORAL | Status: DC | PRN

## 2018-05-07 MED ORDER — ACETAMINOPHEN 325 MG PO TABS: 650.0000 mg | ORAL_TABLET | Freq: Four times a day (QID) | ORAL | Status: DC | PRN

## 2018-05-07 MED ORDER — LORAZEPAM 2 MG/ML IJ SOLN: 1.0000 mg | Freq: Four times a day (QID) | INTRAMUSCULAR | Status: DC | PRN

## 2018-05-07 MED ORDER — DIPHENHYDRAMINE HCL 50 MG/ML IJ SOLN
25.0000 mg | Freq: Once | INTRAMUSCULAR | Status: AC
  Administered 2018-05-07: 25 mg via INTRAVENOUS
  Filled 2018-05-07: qty 1

## 2018-05-07 MED ORDER — FOLIC ACID 1 MG PO TABS
1.0000 mg | ORAL_TABLET | Freq: Every day | ORAL | Status: DC
  Administered 2018-05-07 – 2018-05-09 (×3): 1 mg via ORAL
  Filled 2018-05-07 (×3): qty 1

## 2018-05-07 MED ORDER — ONDANSETRON HCL 4 MG/2ML IJ SOLN: 4.0000 mg | Freq: Four times a day (QID) | INTRAMUSCULAR | Status: DC | PRN

## 2018-05-07 MED ORDER — ONDANSETRON HCL 4 MG PO TABS: 4.0000 mg | ORAL_TABLET | Freq: Four times a day (QID) | ORAL | Status: DC | PRN

## 2018-05-07 MED ORDER — HYDROMORPHONE HCL 1 MG/ML IJ SOLN
1.0000 mg | Freq: Once | INTRAMUSCULAR | Status: AC
  Administered 2018-05-07: 1 mg via INTRAVENOUS
  Filled 2018-05-07: qty 1

## 2018-05-07 MED ORDER — ADULT MULTIVITAMIN W/MINERALS CH
1.0000 | ORAL_TABLET | Freq: Every day | ORAL | Status: DC
  Administered 2018-05-07 – 2018-05-09 (×3): 1 via ORAL
  Filled 2018-05-07 (×3): qty 1

## 2018-05-07 NOTE — ED Provider Notes (Signed)
[facility] EMERGENCY DEPARTMENT Provider Note   CSN: [id number] Arrival date & time: 05/07/18  7829     History   Chief Complaint Chief Complaint  Patient presents with  . Emesis    HPI Brittany [name] is a 42 y.o. female.  HPI Patient presents with nausea, vomiting and abdominal pain starting Sunday evening.  States the pain is in the upper abdomen that radiates through to her back.  Worse after eating food.  No previously similar symptoms.  Denies diarrhea or constipation.  No melanotic or grossly bloody stools.  Patient thinks she may have seen a small amount of red blood in the vomit yesterday.  Has had subjective chills.  She is taking Motrin PM in the evenings.  Past Medical History:  Diagnosis Date  . Bipolar 1 disorder (HCC)    starting to hear voices  . Hypertension   . MVP (mitral valve prolapse)   . Seizures (HCC) 2009   stress induced    Patient Active Problem List   Diagnosis Date Noted  . Cellulitis 08/20/2012  . Pain 08/20/2012  . Bipolar 1 disorder (HCC) 08/20/2012  . 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 Closed left ankle fracture 05/03/2012    Past Surgical History:  Procedure Laterality Date  . LAPAROSCOPIC TUBAL LIGATION  11/04/2012   Procedure: LAPAROSCOPIC TUBAL LIGATION;  Surgeon: Lazaro Arms, MD;  Location: [facility];  Service: Gynecology;  Laterality: Bilateral;  . NASAL SINUS SURGERY       OB History    Gravida  4   Para  4   Term  4   Preterm      AB      Living  4     SAB      TAB      Ectopic      Multiple      Live Births               Home Medications    Prior to Admission medications   Medication Sig Start Date End Date Taking? Authorizing Provider  acamprosate (CAMPRAL) 333 MG tablet Take 666 mg by mouth 3 (three) times daily. 04/12/18  Yes [provider]  acetaminophen (TYLENOL) 500 MG tablet Take 500 mg by mouth every 6 (six) hours as needed for moderate pain.   Yes [provider]  ALPRAZolam Prudy Feeler) 0.5 MG tablet Take 0.5 mg by mouth at bedtime as needed for sleep.   Yes [provider]  cloNIDine (CATAPRES) 0.1 MG tablet Take 0.1 mg by mouth daily. 04/09/18  Yes [provider]  ibuprofen (ADVIL,MOTRIN) 200 MG tablet Take 200 mg by mouth every 6 (six) hours as needed.   Yes [provider]  Ibuprofen-diphenhydrAMINE HCl (ADVIL PM) 200-25 MG CAPS Take 2 capsules by mouth at bedtime as needed (For sleep).   Yes [provider]  SUBOXONE 8-2 MG FILM Place 2 Film under the tongue 2 (two) times daily. 04/22/18  Yes [provider]    Family History History reviewed. No pertinent family history.  Social History Social History   Tobacco Use  . Smoking status: Former Smoker    Years: 10.00    Types: Cigarettes  . Smokeless tobacco: Never Used  Substance Use Topics  . Alcohol use: Yes    Comment: 1-3 drinks per week  . Drug use: No     Allergies   Vicoprofen [hydrocodone-ibuprofen]; Doxycycline; and Morphine and related   Review of  Systems Review of Systems  Constitutional: Positive for chills. Negative for fever.  HENT: Negative for sore throat and trouble swallowing.   Respiratory: Negative for shortness of breath.   Cardiovascular: Negative for chest pain and leg swelling.  Gastrointestinal: Positive for abdominal pain, nausea and vomiting. Negative for blood in stool, constipation and diarrhea.  Genitourinary: Negative for dysuria, flank pain, frequency and hematuria.  Musculoskeletal: Positive for back pain. Negative for neck pain and neck stiffness.  Skin: Negative for rash and wound.  Neurological: Negative for dizziness, weakness, light-headedness, numbness and headaches.  All other systems reviewed and are negative.    Physical Exam Updated Vital Signs BP (!) 152/92 (BP Location: Left Arm)   Pulse 89   Temp 99 F (37.2 C) (Oral)   Resp 16   Ht  (1.626 m)   Wt 86.2 kg (190 lb)    LMP 04/23/2018   SpO2 100%   BMI 32.61 kg/m   Physical Exam  Constitutional: She is oriented to person, place, and time. She appears well-developed and well-nourished. No distress.  HENT:  Head: Normocephalic and atraumatic.  Mouth/Throat: Oropharynx is clear and moist.  Eyes: Pupils are equal, round, and reactive to light. EOM are normal.  Neck: Normal range of motion. Neck supple.  Cardiovascular: Normal rate and regular rhythm. Exam reveals no gallop and no friction rub.  No murmur heard. Pulmonary/Chest: Effort normal and breath sounds normal. No stridor. No respiratory distress. She has no wheezes. She has no rales. She exhibits no tenderness.  Abdominal: Soft. Bowel sounds are normal. There is tenderness. There is no rebound and no guarding.  Right upper, epigastric tenderness but appears most pronounced in the left upper quadrant.  No rebound or guarding.  Musculoskeletal: Normal range of motion. She exhibits no edema or tenderness.  Mild left CVA tenderness.  No midline thoracic or lumbar tenderness.  Neurological: She is alert and oriented to person, place, and time.  Moves all extremities without focal deficit.  Sensation fully intact.  Skin: Skin is warm and dry. Capillary refill takes less than 2 seconds. No rash noted. She is not diaphoretic. No erythema.  Psychiatric: She has a normal mood and affect. Her behavior is normal.  Nursing note and vitals reviewed.    ED Treatments / Results  Labs (all labs ordered are listed, but only abnormal results are displayed) Labs Reviewed  LIPASE, BLOOD - Abnormal; Notable for the following components:      Result Value   Lipase 217 (*)    All other components within normal limits  COMPREHENSIVE METABOLIC PANEL - Abnormal; Notable for the following components:   Sodium 134 (*)    Chloride 95 (*)    Glucose, Bld 119 (*)    Total Protein 9.0 (*)    All other components within normal limits  URINALYSIS, ROUTINE W REFLEX  MICROSCOPIC - Abnormal; Notable for the following components:   Color, Urine AMBER (*)    APPearance CLOUDY (*)    Hgb urine dipstick SMALL (*)    Bilirubin Urine SMALL (*)    Ketones, ur 80 (*)    Protein, ur 100 (*)    All other components within normal limits  CBC  PREGNANCY, URINE    EKG None  Radiology US Abdomen Limited  Result Date: 05/07/2018 CLINICAL DATA:  Upper abdominal pain EXAM: ULTRASOUND ABDOMEN LIMITED RIGHT UPPER QUADRANT COMPARISON:  None. FINDINGS: Gallbladder: No gallstones or wall thickening visualized. No sonographic Murphy sign noted by sonographer. Common  bile duct: Diameter: 2.9 mm Liver: No focal lesion identified. Within normal limits in parenchymal echogenicity. Portal vein is patent on color Doppler imaging with normal direction of blood flow towards the liver. IMPRESSION: Negative right upper quadrant ultrasound Electronically Signed   By: Marlan Palau M.D.   On: 05/07/2018 11:44    Procedures Procedures (including critical care time)  Medications Ordered in ED Medications  sodium chloride 0.9 % bolus 1,000 mL (0 mLs Intravenous Stopped 05/07/18 1252)  ondansetron (ZOFRAN) injection 4 mg (4 mg Intravenous Given 05/07/18 1046)  HYDROmorphone (DILAUDID) injection 0.5 mg (0.5 mg Intravenous Given 05/07/18 1254)  diphenhydrAMINE (BENADRYL) injection 25 mg (25 mg Intravenous Given 05/07/18 1254)     Initial Impression / Assessment and Plan / ED Course  I have reviewed the triage vital signs and the nursing notes.  Pertinent labs & imaging results that were available during my care of the patient were reviewed by me and considered in my medical decision making (see chart for details).    Persistent pain radiating through to her back.  Elevated lipase.  Ultrasound abdomen without acute findings.  Patient does admit to drinking alcohol regularly.  Likely alcohol pancreatitis though there may be a gastritis component.  Will discuss with hospitalist regarding  admission.   Final Clinical Impressions(s) / ED Diagnoses   Final diagnoses:  Idiopathic acute pancreatitis without infection or necrosis  Dehydration    ED Discharge Orders    None       Loren Racer, MD 05/07/18 1318

## 2018-05-07 NOTE — H&P (Signed)
History and Physical    Brittany [name] EAV:[id number] DOB: [birth year] DOA: 05/07/2018  PCP: [facility]   I have briefly reviewed patients previous medical reports in [facility].  Patient coming from: home  Chief Complaint: abd pain, nausea, vomiting.  HPI: Brittany [name] is a 42 year old female with past medical history significant for anxiety, hypertension, alcohol abuse and chronic pain syndrome; who presented to the emergency department secondary to nausea, vomiting and abdominal pain.  Pain is located in her epigastric area, radiated to her back, 7-8/10 in intensity, worse when eating, with associated nausea/vomiting (no blood content appreciated) and w/o alleviating factors.  Patient reported that the pain started for the last 3 days and is worsening. Of note patient express cutting down alcohol consumption but is still drinking. She denies chest pain, shortness of breath, dysuria, hematuria, fever, chills, melena, hematochezia or any other acute complaints.  ED Course: Work-up demonstrated elevated lipase, abdominal ultrasound without acute abnormalities in her biliary ductsor gallbladder, mild hyponatremia and hypochloremia (suggesting dehydration) and a urinalysis with positive ketones, specific gravity of 1.029.  Patient received IV fluids, antiemetics and pain medications.  Review of Systems:  All other systems reviewed and apart from HPI, are negative.  Past Medical History:  Diagnosis Date  . Bipolar 1 disorder (HCC)    starting to hear voices  . Hypertension   . MVP (mitral valve prolapse)   . Seizures (HCC) 2009   stress induced    Past Surgical History:  Procedure Laterality Date  . LAPAROSCOPIC TUBAL LIGATION  11/04/2012   Procedure: LAPAROSCOPIC TUBAL LIGATION;  Surgeon: Lazaro Arms, MD;  Location: [facility];  Service: Gynecology;  Laterality: Bilateral;  . NASAL SINUS SURGERY      Social History  reports that she has quit  smoking. Her smoking use included cigarettes. She quit after 10.00 years of use. She has never used smokeless tobacco. She reports that she drinks alcohol. She reports that she does not use drugs.  Allergies  Allergen Reactions  . Vicoprofen [Hydrocodone-Ibuprofen] Nausea Only  . Doxycycline Nausea And Vomiting  . Morphine And Related Itching    Family history: -Reviewed with patient and mother at bedside, significant only for hypertension; otherwise no contributory.   Prior to Admission medications   Medication Sig Start Date End Date Taking? Authorizing Provider  acamprosate (CAMPRAL) 333 MG tablet Take 666 mg by mouth 3 (three) times daily. 04/12/18  Yes [provider]  acetaminophen (TYLENOL) 500 MG tablet Take 500 mg by mouth every 6 (six) hours as needed for moderate pain.   Yes [provider]  ALPRAZolam Prudy Feeler) 0.5 MG tablet Take 0.5 mg by mouth at bedtime as needed for sleep.   Yes [provider]  cloNIDine (CATAPRES) 0.1 MG tablet Take 0.1 mg by mouth daily. 04/09/18  Yes [provider]  ibuprofen (ADVIL,MOTRIN) 200 MG tablet Take 200 mg by mouth every 6 (six) hours as needed.   Yes [provider]  Ibuprofen-diphenhydrAMINE HCl (ADVIL PM) 200-25 MG CAPS Take 2 capsules by mouth at bedtime as needed (For sleep).   Yes [provider]  SUBOXONE 8-2 MG FILM Place 2 Film under the tongue 2 (two) times daily. 04/22/18  Yes [provider]    Physical Exam: Vitals:   05/07/18 1430 05/07/18 1507 05/07/18 1511 05/07/18 1751  BP: 117/68 (!) 155/114  (!) 156/107  Pulse: 99 90  94  Resp: 16 16    Temp:  98.8  F (37.1 C)    TempSrc:  Oral    SpO2: 100% 100%    Weight:   86.2 kg (190 lb)   Height:    (1.626 m)     Constitutional: Mild distress secondary to abdominal pain, reports feeling nausea no further vomiting.  No fever. Eyes: PERTLA, lids and conjunctivae normal, no icterus, no nystagmus.  ENMT: Mucous  membranes were dry on examination. Posterior pharynx clear of any exudate or lesions.  No thrush. Neck: supple, no masses, no thyromegaly, no JVD Respiratory: clear to auscultation bilaterally, no wheezing, no crackles. Normal respiratory effort. No accessory muscle use.  Cardiovascular: S1 & S2 heard, regular rate and rhythm, no murmurs / rubs / gallops. No extremity edema. 2+ pedal pulses. No carotid bruits.  Abdomen: Soft, no guarding, tender to palpation in mid-epigastric area with radiation to both sides of her upper quadrants and to her back, positive bowel sounds, no masses, no hepatosplenomegaly.  Musculoskeletal: no clubbing / cyanosis. No joint deformity upper and lower extremities. Good ROM, no contractures. Normal muscle tone.  Skin: no rashes, lesions, ulcers. No induration; positive tattoos appreciated in her upper extremity and lower extremities. Neurologic: CN 2-12 grossly intact. Sensation intact, DTR normal. Strength 5/5 in all 4 limbs.  Psychiatric: Normal judgment and insight. Alert and oriented x 3. Slightly anxious.  Labs on Admission: I have personally reviewed following labs and imaging studies  CBC: Recent Labs  Lab 05/07/18 0939  WBC 9.9  HGB 13.1  HCT 42.3  MCV 86.9  PLT 340   Basic Metabolic Panel: Recent Labs  Lab 05/07/18 0939  NA 134*  K 3.5  CL 95*  CO2 27  GLUCOSE 119*  BUN 7  CREATININE 0.61  CALCIUM 9.6   Liver Function Tests: Recent Labs  Lab 05/07/18 0939  AST 23  ALT 14  ALKPHOS 97  BILITOT 0.9  PROT 9.0*  ALBUMIN 4.3   Urine analysis:    Component Value Date/Time   COLORURINE AMBER (A) 05/07/2018 0950   APPEARANCEUR CLOUDY (A) 05/07/2018 0950   LABSPEC 1.029 05/07/2018 0950   PHURINE 6.0 05/07/2018 0950   GLUCOSEU NEGATIVE 05/07/2018 0950   HGBUR SMALL (A) 05/07/2018 0950   BILIRUBINUR SMALL (A) 05/07/2018 0950   KETONESUR 80 (A) 05/07/2018 0950   PROTEINUR 100 (A) 05/07/2018 0950   NITRITE NEGATIVE 05/07/2018 0950    LEUKOCYTESUR NEGATIVE 05/07/2018 0950   Radiological Exams on Admission: US Abdomen Limited  Result Date: 05/07/2018 CLINICAL DATA:  Upper abdominal pain EXAM: ULTRASOUND ABDOMEN LIMITED RIGHT UPPER QUADRANT COMPARISON:  None. FINDINGS: Gallbladder: No gallstones or wall thickening visualized. No sonographic Murphy sign noted by sonographer. Common bile duct: Diameter: 2.9 mm Liver: No focal lesion identified. Within normal limits in parenchymal echogenicity. Portal vein is patent on color Doppler imaging with normal direction of blood flow towards the liver. IMPRESSION: Negative right upper quadrant ultrasound Electronically Signed   By: Marlan Palau M.D.   On: 05/07/2018 11:44    EKG: None.  Assessment/Plan 1-intractable nausea/vomiting and abdominal pain: In the setting of acute alcoholic pancreatitis. -N.p.o. for bowel rest -place in observation -provide IVF's resuscitation and supportive. -As needed antiemetics and analgesics -Okay for sips with medications and ice chips. -follow clinical response. -repeat lipase in am  2-Alcohol abuse -will use CIWA protocol -start thiamine and folic acid -Alcohol cessation counseling  3-Benign essential HTN -Resume clonidine -IV hydralazine as needed for uncontrolled blood pressure. -keeping in mind that her pain can contribute to  elevation in BP as well.   4-Dehydration: -Maintain in the setting of problem #1 -Provide fluid resuscitation -follow-up volume status.  5-Chronic pain -History of chronic pain and currently in the process of weaning herself off suboxone -minimize the use of IV narcotics -resume subotex -Continue outpatient follow-up with pain clinic.  6-GERD (gastroesophageal reflux disease) -will use PPI and PRN GI cocktail -NSAID's discontinued   7-Class 1 obesity due to excess calories, without serious comorbidity and body mass index (BMI) of 32.6 -Low-calorie diet and increase exercise discussed with  patient -Body mass index is 32.61 kg/m.   8-Anxiety -outpatient psychiatry referral -continue xanax PRN  9-prior hx of seizure  -associated with stress according to patient -most likely due to ETOH usage in the past. -no on AED's -will monitor for now.  Time: 65 minutes.   DVT prophylaxis: SCD's Code Status: Full code Family Communication: mother at bedside. Disposition Plan: Home once abd pain, nausea and vomiting controlled.  Consults called: none  Admission status: observation, LOS < 2 midnights, med-surg bed.   Marializ Ferrebee MD [facility] Pager [phone]  If 7PM-7AM, please contact night-coverage [email or web address] Password [facility]  05/07/2018, 5:54 PM   .

## 2018-05-07 NOTE — ED Triage Notes (Signed)
Pt reports V/ since Sunday with upper abd pain.

## 2018-05-08 DIAGNOSIS — E6609 Other obesity due to excess calories: Secondary | ICD-10-CM | POA: Diagnosis present

## 2018-05-08 DIAGNOSIS — Z885 Allergy status to narcotic agent status: Secondary | ICD-10-CM | POA: Diagnosis not present

## 2018-05-08 DIAGNOSIS — K852 Alcohol induced acute pancreatitis without necrosis or infection: Secondary | ICD-10-CM | POA: Diagnosis present

## 2018-05-08 DIAGNOSIS — Z87891 Personal history of nicotine dependence: Secondary | ICD-10-CM | POA: Diagnosis not present

## 2018-05-08 DIAGNOSIS — I341 Nonrheumatic mitral (valve) prolapse: Secondary | ICD-10-CM | POA: Diagnosis present

## 2018-05-08 DIAGNOSIS — E876 Hypokalemia: Secondary | ICD-10-CM | POA: Diagnosis not present

## 2018-05-08 DIAGNOSIS — F1010 Alcohol abuse, uncomplicated: Secondary | ICD-10-CM | POA: Diagnosis present

## 2018-05-08 DIAGNOSIS — I10 Essential (primary) hypertension: Secondary | ICD-10-CM | POA: Diagnosis present

## 2018-05-08 DIAGNOSIS — F319 Bipolar disorder, unspecified: Secondary | ICD-10-CM | POA: Diagnosis present

## 2018-05-08 DIAGNOSIS — E860 Dehydration: Secondary | ICD-10-CM

## 2018-05-08 DIAGNOSIS — Z8249 Family history of ischemic heart disease and other diseases of the circulatory system: Secondary | ICD-10-CM | POA: Diagnosis not present

## 2018-05-08 DIAGNOSIS — K859 Acute pancreatitis without necrosis or infection, unspecified: Secondary | ICD-10-CM | POA: Diagnosis present

## 2018-05-08 DIAGNOSIS — Z9851 Tubal ligation status: Secondary | ICD-10-CM | POA: Diagnosis not present

## 2018-05-08 DIAGNOSIS — Z881 Allergy status to other antibiotic agents status: Secondary | ICD-10-CM | POA: Diagnosis not present

## 2018-05-08 DIAGNOSIS — G894 Chronic pain syndrome: Secondary | ICD-10-CM | POA: Diagnosis present

## 2018-05-08 DIAGNOSIS — Z79899 Other long term (current) drug therapy: Secondary | ICD-10-CM | POA: Diagnosis not present

## 2018-05-08 DIAGNOSIS — F419 Anxiety disorder, unspecified: Secondary | ICD-10-CM | POA: Diagnosis present

## 2018-05-08 DIAGNOSIS — K219 Gastro-esophageal reflux disease without esophagitis: Secondary | ICD-10-CM | POA: Diagnosis present

## 2018-05-08 DIAGNOSIS — Z6832 Body mass index (BMI) 32.0-32.9, adult: Secondary | ICD-10-CM | POA: Diagnosis not present

## 2018-05-08 LAB — LIPASE, BLOOD: Lipase: 130 U/L — ABNORMAL HIGH (ref 11–51)

## 2018-05-08 LAB — BASIC METABOLIC PANEL
ANION GAP: 8 (ref 5–15)
BUN: 6 mg/dL (ref 6–20)
CALCIUM: 8.6 mg/dL — AB (ref 8.9–10.3)
CO2: 26 mmol/L (ref 22–32)
Chloride: 100 mmol/L — ABNORMAL LOW (ref 101–111)
Creatinine, Ser: 0.54 mg/dL (ref 0.44–1.00)
GFR calc non Af Amer: >60 mL/min (ref >60)
Glucose, Bld: 77 mg/dL (ref 65–99)
POTASSIUM: 3.7 mmol/L (ref 3.5–5.1)
Sodium: 134 mmol/L — ABNORMAL LOW (ref 135–145)

## 2018-05-08 NOTE — Progress Notes (Signed)
[facility] PROGRESS NOTE  Brittany [name] UJW:[id number] DOB: [birth year] DOA: 05/07/2018 PCP: [facility], [facility]  Interim summary and history of present illness. 42 year old female with past medical history significant for anxiety, hypertension, alcohol abuse and chronic pain syndrome; who presented to the emergency department secondary to nausea, vomiting and abdominal pain.  Pain is located in her epigastric area, radiated to her back, 7-8/10 in intensity, worse when eating, with associated nausea/vomiting (no blood content appreciated) and w/o alleviating factors.  Patient reported that the pain started for the last 3 days and is worsening. Of note patient express cutting down alcohol consumption but is still drinking.  Assessment/Plan: 1-intractable nausea/vomiting and abdominal pain: In the setting of acute alcoholic pancreatitis. -No further nausea or vomiting -Patient shows reporting mild mid epigastric discomfort -Will decrease IV fluids rate -Start advancing diet -Continue as needed analgesics and antiemetics -Lipase has trended down.  2-Alcohol abuse -Cessation counseling once again provided -Continue thiamine and folic acid -No withdrawal symptoms appreciated -Continue CIWA protocol monitoring.  3-Benign essential HTN -Improved/controlled -Continue clonidine -Continue IV hydralazine as needed.   4-Dehydration: -Improved/resolved -We will decrease IV fluid rates -Patient advised to keep herself hydrated by mouth and that her diet is advanced.    5-Chronic pain -History of chronic pain and currently in the process of weaning herself off Suboxone -Continue Ciprodex while inpatient -Minimize/avoid IV narcotics as much as possible.   -Outpatient follow-up with pain clinic has been encouraged.   6-GERD (gastroesophageal reflux disease) -Continue PPI -Continue as needed GI cocktail -NSAIDs has been discontinued outpatient educated about  chronic use.  7-Class 1 obesity due to excess calories, without serious comorbidity and body mass index (BMI) of 32.6 -Low calorie diet and increase physical activity has been discussed with patient -Body mass index is 32.61 kg/m.   8-Anxiety -Patient will benefit of outpatient psychiatry for my evaluation and further treatment of her underlying mood disorder. -Continue as needed Xanax  9-prior hx of seizure  -No further episodes of seizure activity and currently not taking any antiepileptic drugs. -Continue monitoring.    Code Status: Full code Family Communication: No family at bedside.  Mom updated at the moment of admission on 05/07/2018. Disposition Plan: Continue fluid resuscitation and electrolyte repletion, advance diet, continue as needed pain medications and antiemetics.  If patient tolerates p.o. will be discharged home in a.m.   Consultants: None  Procedures:  See below for x-ray reports.  Antibiotics:  None  HPI/Subjective: Improved, no chest pain, no shortness of breath, mild discomfort in her epigastric area, no further nausea or vomiting.  Reports that she is hungry and would like to have her diet advanced.  Objective: Vitals:   05/08/18 0743 05/08/18 1419  BP: 126/78 123/75  Pulse: 93 100  Resp: 16 16  Temp: 98.9 F (37.2 C) 98.9 F (37.2 C)  SpO2: 100% 99%    Intake/Output Summary (Last 24 hours) at 05/08/2018 1636 Last data filed at 05/08/2018 1510 Gross per 24 hour  Intake 1556.25 ml  Output -  Net 1556.25 ml   Filed Weights   05/07/18 0927 05/07/18 1511  Weight: 86.2 kg (190 lb) 86.2 kg (190 lb)    Exam:   General: Afebrile, no further nausea vomiting; patient reports to be hungry and expressed just mild abdominal discomfort at this time.  Cardiovascular: S1 and S2, no rubs, no gallops, no murmurs.  Respiratory: Clear to auscultation bilaterally, no using accessory muscles, good oxygen saturation on room air.  Abdomen:  Soft, mild  tenderness in her mid epigastric region, nondistended, positive bowel sounds.  Musculoskeletal: No edema, no cyanosis, no clubbing.  Data Reviewed: Basic Metabolic Panel: Recent Labs  Lab 05/07/18 0939 05/07/18 1750 05/08/18 0555  NA 134*  --  134*  K 3.5  --  3.7  CL 95*  --  100*  CO2 27  --  26  GLUCOSE 119*  --  77  BUN 7  --  6  CREATININE 0.61  --  0.54  CALCIUM 9.6  --  8.6*  MG  --  1.9  --   PHOS  --  3.7  --    Liver Function Tests: Recent Labs  Lab 05/07/18 0939  AST 23  ALT 14  ALKPHOS 97  BILITOT 0.9  PROT 9.0*  ALBUMIN 4.3   Recent Labs  Lab 05/07/18 0939 05/08/18 0555  LIPASE 217* 130*   CBC: Recent Labs  Lab 05/07/18 0939  WBC 9.9  HGB 13.1  HCT 42.3  MCV 86.9  PLT 340    Studies: US Abdomen Limited  Result Date: 05/07/2018 CLINICAL DATA:  Upper abdominal pain EXAM: ULTRASOUND ABDOMEN LIMITED RIGHT UPPER QUADRANT COMPARISON:  None. FINDINGS: Gallbladder: No gallstones or wall thickening visualized. No sonographic Murphy sign noted by sonographer. Common bile duct: Diameter: 2.9 mm Liver: No focal lesion identified. Within normal limits in parenchymal echogenicity. Portal vein is patent on color Doppler imaging with normal direction of blood flow towards the liver. IMPRESSION: Negative right upper quadrant ultrasound Electronically Signed   By: Marlan Palau M.D.   On: 05/07/2018 11:44    Scheduled Meds: . buprenorphine  4 mg Sublingual BID  . cloNIDine  0.1 mg Oral Daily  . folic acid  1 mg Oral Daily  . multivitamin with minerals  1 tablet Oral Daily  . pantoprazole  40 mg Oral BID  . thiamine  100 mg Oral Daily   Or  . thiamine  100 mg Intravenous Daily   Continuous Infusions: . sodium chloride 75 mL/hr at 05/07/18 1825    Time spent: 30 minutes    Vassie Loll  [facility] Pager [phone]. If 7PM-7AM, please contact night-coverage at [email or web address], password [facility] 05/08/2018, 4:36 PM  LOS: 0 days

## 2018-05-09 LAB — BASIC METABOLIC PANEL
Anion gap: 8 (ref 5–15)
CO2: 26 mmol/L (ref 22–32)
CREATININE: 0.41 mg/dL — AB (ref 0.44–1.00)
Calcium: 8.4 mg/dL — ABNORMAL LOW (ref 8.9–10.3)
Chloride: 101 mmol/L (ref 101–111)
GFR calc Af Amer: >60 mL/min (ref >60)
Glucose, Bld: 99 mg/dL (ref 65–99)
Potassium: 3.1 mmol/L — ABNORMAL LOW (ref 3.5–5.1)
Sodium: 135 mmol/L (ref 135–145)

## 2018-05-09 LAB — LIPASE, BLOOD: LIPASE: 96 U/L — AB (ref 11–51)

## 2018-05-09 LAB — HIV ANTIBODY (ROUTINE TESTING W REFLEX): HIV Screen 4th Generation wRfx: NONREACTIVE

## 2018-05-09 MED ORDER — ONE-A-DAY WOMENS FORMULA PO TABS: 1.0000 | ORAL_TABLET | Freq: Every day | ORAL | Status: AC

## 2018-05-09 MED ORDER — ALPRAZOLAM 0.5 MG PO TABS: 0.5000 mg | ORAL_TABLET | Freq: Every evening | ORAL | 0 refills | Status: DC | PRN

## 2018-05-09 MED ORDER — CLONIDINE HCL 0.1 MG PO TABS: 0.1000 mg | ORAL_TABLET | Freq: Every day | ORAL | 2 refills | Status: DC

## 2018-05-09 MED ORDER — CHLORDIAZEPOXIDE HCL 10 MG PO CAPS: ORAL_CAPSULE | ORAL | 0 refills | Status: DC

## 2018-05-09 MED ORDER — POTASSIUM CHLORIDE CRYS ER 20 MEQ PO TBCR
40.0000 meq | EXTENDED_RELEASE_TABLET | ORAL | Status: AC
  Administered 2018-05-09 (×2): 40 meq via ORAL
  Filled 2018-05-09: qty 2

## 2018-05-09 MED ORDER — PANTOPRAZOLE SODIUM 40 MG PO TBEC: 40.0000 mg | DELAYED_RELEASE_TABLET | Freq: Every day | ORAL | 1 refills | Status: DC

## 2018-05-09 MED ORDER — VITAMIN B-12 1000 MCG PO TABS: 1000.0000 ug | ORAL_TABLET | Freq: Every day | ORAL | 2 refills | Status: DC

## 2018-05-09 NOTE — Discharge Summary (Signed)
Physician Discharge Summary  Brittany [name] OZH:[id number] DOB: 1976-11-13 DOA: 05/07/2018  PCP: [facility]  Admit date: 05/07/2018 Discharge date: 05/09/2018  Time spent: 35 minutes  Recommendations for Outpatient Follow-up:  1. Repeat basic metabolic panel to follow electrolytes and renal function. 2. Continue assisting patient with alcohol cessation 3. Arrange referral to psychiatry services to further assess/treat underlying mood disorder. 4. Reassess blood pressure no further adjust antihypertensive regimen as needed.   Discharge Diagnoses:  Principal Problem:   Acute pancreatitis Active Problems:   Alcohol abuse   Benign essential HTN   Dehydration   Chronic pain   GERD (gastroesophageal reflux disease)   Class 1 obesity due to excess calories with serious comorbidity and body mass index (BMI) of 32.0 to 32.9 in adult   Anxiety   Discharge Condition: Stable and improved.  Patient has been discharged home with instruction to follow-up with PCP in 10 days.  Diet recommendation: Heart healthy/low-fat diet.  Filed Weights   05/07/18 0927 05/07/18 1511  Weight: 86.2 kg (190 lb) 86.2 kg (190 lb)    History of present illness:  42 year old female with past medical history significant for anxiety, hypertension, alcohol abuse and chronic pain syndrome; who presented to the emergency department secondary to nausea, vomiting and abdominal pain. Pain is located in her epigastric area, radiated to her back, 7-8/10 in intensity, worse when eating, with associated nausea/vomiting (no blood content appreciated) and w/o alleviating factors. Patient reported that the pain started for the last 3 days and is worsening. Of note patient express cutting down alcohol consumption but is still drinking.  Hospital Course:  1-intractable nausea/vomiting and abdominal pain: In the setting of acute alcoholic pancreatitis. -no pain, no nausea or vomiting -tolerating diet,  advise to follow low fat diet.  -Lipase has continue trending down. -instructed to quit drinking.  2-Alcohol abuse -Cessation counseling once again provided -Continue thiamine, B12 and folic acid supplementation -discharge on librium tapering   3-Benign essential HTN -stable and controlled  -Continue clonidine -heart healthy diet encouraged   4-Dehydration: -resolved -patient eating and drinking w/o difficulties  -Patient advised to keep herself well hydrated at discharge.  5-Chronic pain -History of chronic pain and currently in the process of weaning herself off Suboxone -Subotex use while inpatient -Outpatient follow-up with pain clinic has been encouraged.   6-GERD (gastroesophageal reflux disease) -Continue PPI at discharge -advise to stop drinking alcohol and to stop use of NSAIDs  7-Class 1 obesity due to excess calories, without serious comorbidity and body mass index (BMI) of 32.6 -Low calorie diet and increase physical activity has been discussed with patient -Body mass index is 32.61 kg/m.   8-Anxiety -Patient will benefit of outpatient psychiatry for evaluation and further treatment of her underlying mood disorder. -Continue as needed Xanax.  9-prior hx of seizure episode: apparently single event. -No further episodes of seizure activity and currently not taking any antiepileptic drugs. -Continue outpatient follow up/monitoring by PCP.    10-hypokalemia: -Repleted and patient discharged with instruction to use multivitamins on a daily basis for further supplementation.   Procedures:  See below for x-ray reports.  Consultations:  None  Discharge Exam: Vitals:   05/08/18 2126 05/09/18 0534  BP: 127/86 112/68  Pulse: (!) 105 93  Resp: 16 18  Temp: 98.7 F (37.1 C) 98.5 F (36.9 C)  SpO2: 99% 100%     General: Afebrile, no further nausea vomiting; no abdominal pain and was able to tolerate diet without any problems.  Cardiovascular:  S1 and S2, no rubs, no gallops, no murmurs.  Respiratory: Clear to auscultation bilaterally, no using accessory muscles, good oxygen saturation on room air.  Abdomen: Soft, currently no tender to palpation, nondistended, positive bowel sounds.  Musculoskeletal: No edema, no cyanosis, no clubbing.   Discharge Instructions   Discharge Instructions    Discharge instructions   Complete by:  As directed    Take medications as prescribed Arrange follow-up with PCP in 10 days Keep yourself well-hydrated Avoid the use of NSAIDs as discussed Follow a low-fat diet Stop alcohol consumption.     Allergies as of 05/09/2018      Reactions   Vicoprofen [hydrocodone-ibuprofen] Nausea Only   Doxycycline Nausea And Vomiting   Morphine And Related Itching      Medication List    STOP taking these medications   ADVIL PM 200-25 MG Caps Generic drug:  Ibuprofen-diphenhydrAMINE HCl   ibuprofen 200 MG tablet Commonly known as:  ADVIL,MOTRIN     TAKE these medications   acamprosate 333 MG tablet Commonly known as:  CAMPRAL Take 666 mg by mouth 3 (three) times daily.   acetaminophen 500 MG tablet Commonly known as:  TYLENOL Take 500 mg by mouth every 6 (six) hours as needed for moderate pain.   ALPRAZolam 0.5 MG tablet Commonly known as:  XANAX Take 1 tablet (0.5 mg total) by mouth at bedtime as needed for anxiety or sleep. What changed:  reasons to take this   chlordiazePOXIDE 10 MG capsule Commonly known as:  LIBRIUM Take 2 capsules by mouth 3 times a day all for 2 days; then 1 capsule by mouth 3 times a day for 2 days; then 1 capsule by mouth twice a day for 2 days; then 1 capsule by mouth daily for 3 days; then stop librium.   cloNIDine 0.1 MG tablet Commonly known as:  CATAPRES Take 1 tablet (0.1 mg total) by mouth daily.   ONE-A-DAY WOMENS FORMULA Tabs Take 1 tablet by mouth daily.   pantoprazole 40 MG tablet Commonly known as:  PROTONIX Take 1 tablet (40 mg total) by  mouth daily.   SUBOXONE 8-2 MG Film Generic drug:  Buprenorphine HCl-Naloxone HCl Place 2 Film under the tongue 2 (two) times daily.   vitamin B-12 1000 MCG tablet Commonly known as:  CYANOCOBALAMIN Take 1 tablet (1,000 mcg total) by mouth daily.      Allergies  Allergen Reactions  . Vicoprofen [Hydrocodone-Ibuprofen] Nausea Only  . Doxycycline Nausea And Vomiting  . Morphine And Related Itching   Follow-up Information    [facility], [facility] ([facility] Schedule an appointment as soon as possible for a visit in 10 day(s).   Contact information: [address] [place] [address] [phone]           The results of significant diagnostics from this hospitalization (including imaging, microbiology, ancillary and laboratory) are listed below for reference.    Significant Diagnostic Studies: US Abdomen Limited  Result Date: 05/07/2018 CLINICAL DATA:  Upper abdominal pain EXAM: ULTRASOUND ABDOMEN LIMITED RIGHT UPPER QUADRANT COMPARISON:  None. FINDINGS: Gallbladder: No gallstones or wall thickening visualized. No sonographic Murphy sign noted by sonographer. Common bile duct: Diameter: 2.9 mm Liver: No focal lesion identified. Within normal limits in parenchymal echogenicity. Portal vein is patent on color Doppler imaging with normal direction of blood flow towards the liver. IMPRESSION: Negative right upper quadrant ultrasound Electronically Signed   By: Marlan Palau M.D.   On: 05/07/2018 11:44  Labs: Basic Metabolic Panel: Recent Labs  Lab 05/07/18 0939 05/07/18 1750 05/08/18 0555 05/09/18 0607  NA 134*  --  134* 135  K 3.5  --  3.7 3.1*  CL 95*  --  100* 101  CO2 27  --  26 26  GLUCOSE 119*  --  77 99  BUN 7  --  6 <5*  CREATININE 0.61  --  0.54 0.41*  CALCIUM 9.6  --  8.6* 8.4*  MG  --  1.9  --   --   PHOS  --  3.7  --   --    Liver Function Tests: Recent Labs  Lab 05/07/18 0939  AST 23  ALT 14  ALKPHOS 97  BILITOT 0.9  PROT 9.0*  ALBUMIN 4.3    Recent Labs  Lab 05/07/18 0939 05/08/18 0555 05/09/18 0607  LIPASE 217* 130* 96*   CBC: Recent Labs  Lab 05/07/18 0939  WBC 9.9  HGB 13.1  HCT 42.3  MCV 86.9  PLT 340    Signed:  Vassie Loll MD.  [facility] 05/09/2018, 1:27 PM

## 2018-05-09 NOTE — Progress Notes (Signed)
Patient discharged home.  IV removed - WNL.  Reviewed AVS and medications.  Instructed to follow up with PCP.  All questions answered.  Verbalizes understanding. Patient in NAD at this time.

## 2019-12-12 ENCOUNTER — Encounter (HOSPITAL_COMMUNITY): Payer: Self-pay

## 2019-12-12 ENCOUNTER — Other Ambulatory Visit: Payer: Self-pay

## 2019-12-12 ENCOUNTER — Emergency Department (HOSPITAL_COMMUNITY)
Admission: EM | Admit: 2019-12-12 | Discharge: 2019-12-12 | Disposition: A | Discharge status: Home / Self Care | Payer: Self-pay | Attending: Emergency Medicine | Admitting: Emergency Medicine

## 2019-12-12 ENCOUNTER — Emergency Department (HOSPITAL_COMMUNITY): Payer: Self-pay

## 2019-12-12 DIAGNOSIS — I10 Essential (primary) hypertension: Secondary | ICD-10-CM | POA: Insufficient documentation

## 2019-12-12 DIAGNOSIS — Z79899 Other long term (current) drug therapy: Secondary | ICD-10-CM | POA: Insufficient documentation

## 2019-12-12 DIAGNOSIS — Z87891 Personal history of nicotine dependence: Secondary | ICD-10-CM | POA: Insufficient documentation

## 2019-12-12 DIAGNOSIS — K852 Alcohol induced acute pancreatitis without necrosis or infection: Secondary | ICD-10-CM | POA: Insufficient documentation

## 2019-12-12 HISTORY — DX: Alcohol abuse, uncomplicated: F10.10

## 2019-12-12 HISTORY — DX: Acute pancreatitis without necrosis or infection, unspecified: K85.90

## 2019-12-12 LAB — CBC WITH DIFFERENTIAL/PLATELET
Abs Immature Granulocytes: 0.02 10*3/uL (ref 0.00–0.07)
Basophils Absolute: 0 10*3/uL (ref 0.0–0.1)
Basophils Relative: 0 %
Eosinophils Absolute: 0.1 10*3/uL (ref 0.0–0.5)
Eosinophils Relative: 1 %
HCT: 42.5 % (ref 36.0–46.0)
Hemoglobin: 12.8 g/dL (ref 12.0–15.0)
Immature Granulocytes: 0 %
Lymphocytes Relative: 11 %
Lymphs Abs: 1 10*3/uL (ref 0.7–4.0)
MCH: 26.6 pg (ref 26.0–34.0)
MCHC: 30.1 g/dL (ref 30.0–36.0)
MCV: 88.4 fL (ref 80.0–100.0)
Monocytes Absolute: 0.4 10*3/uL (ref 0.1–1.0)
Monocytes Relative: 4 %
Neutro Abs: 8 10*3/uL — ABNORMAL HIGH (ref 1.7–7.7)
Neutrophils Relative %: 84 %
Platelets: 401 10*3/uL — ABNORMAL HIGH (ref 150–400)
RBC: 4.81 MIL/uL (ref 3.87–5.11)
RDW: 14.3 % (ref 11.5–15.5)
WBC: 9.6 10*3/uL (ref 4.0–10.5)
nRBC: 0 % (ref 0.0–0.2)

## 2019-12-12 LAB — PREGNANCY, URINE: Preg Test, Ur: NEGATIVE

## 2019-12-12 LAB — URINALYSIS, ROUTINE W REFLEX MICROSCOPIC
Bilirubin Urine: NEGATIVE
Glucose, UA: NEGATIVE mg/dL
Ketones, ur: 20 mg/dL — AB
Leukocytes,Ua: NEGATIVE
Nitrite: NEGATIVE
Protein, ur: 30 mg/dL — AB
Specific Gravity, Urine: 1.027 (ref 1.005–1.030)
pH: 5 (ref 5.0–8.0)

## 2019-12-12 LAB — COMPREHENSIVE METABOLIC PANEL
ALT: 19 U/L (ref 0–44)
AST: 24 U/L (ref 15–41)
Albumin: 4.5 g/dL (ref 3.5–5.0)
Alkaline Phosphatase: 90 U/L (ref 38–126)
Anion gap: 12 (ref 5–15)
BUN: 6 mg/dL (ref 6–20)
CO2: 26 mmol/L (ref 22–32)
Calcium: 9.1 mg/dL (ref 8.9–10.3)
Chloride: 97 mmol/L — ABNORMAL LOW (ref 98–111)
Creatinine, Ser: 0.54 mg/dL (ref 0.44–1.00)
GFR calc Af Amer: >60 mL/min (ref >60)
GFR calc non Af Amer: >60 mL/min (ref >60)
Glucose, Bld: 120 mg/dL — ABNORMAL HIGH (ref 70–99)
Potassium: 3.5 mmol/L (ref 3.5–5.1)
Sodium: 135 mmol/L (ref 135–145)
Total Bilirubin: 0.7 mg/dL (ref 0.3–1.2)
Total Protein: 8.5 g/dL — ABNORMAL HIGH (ref 6.5–8.1)

## 2019-12-12 LAB — LIPASE, BLOOD: Lipase: 100 U/L — ABNORMAL HIGH (ref 11–51)

## 2019-12-12 MED ORDER — ONDANSETRON HCL 4 MG/2ML IJ SOLN
4.0000 mg | Freq: Once | INTRAMUSCULAR | Status: AC
  Administered 2019-12-12: 11:00:00 4 mg via INTRAVENOUS
  Filled 2019-12-12: qty 2

## 2019-12-12 MED ORDER — FENTANYL CITRATE (PF) 100 MCG/2ML IJ SOLN
50.0000 ug | Freq: Once | INTRAMUSCULAR | Status: AC
  Administered 2019-12-12: 50 ug via INTRAVENOUS
  Filled 2019-12-12: qty 2

## 2019-12-12 MED ORDER — PROMETHAZINE HCL 25 MG PO TABS: 25.0000 mg | ORAL_TABLET | Freq: Four times a day (QID) | ORAL | 0 refills | Status: DC | PRN

## 2019-12-12 NOTE — ED Notes (Signed)
To CT via stretcher

## 2019-12-12 NOTE — ED Notes (Signed)
JI in to assess 

## 2019-12-12 NOTE — ED Triage Notes (Signed)
Pt c/o pain in lower back and lower abd x 3 days.  Reports feels like contractions.  Reports vomiting.  LBM was this morning and was runny.  Denies any urinary symptoms, denies any abnormal vaginal bleeding or discharge.

## 2019-12-12 NOTE — ED Notes (Signed)
Pt reports similar experience like this pain several years ago when she had pancreatitis  Reports also a hx of HTN but has not been on meds for years   IV established   Awaiting [place]

## 2019-12-12 NOTE — ED Notes (Signed)
JI in to reassess ans discuss findings  VO for po challenge   If pt tolerates she will be DC'd   Pt up for DC

## 2019-12-12 NOTE — ED Notes (Signed)
Pt reports drinking last weekend

## 2019-12-12 NOTE — ED Provider Notes (Signed)
[facility] EMERGENCY DEPARTMENT Provider Note   CSN: [id number] Arrival date & time: 12/12/19  4010     History Chief Complaint  Patient presents with  . Abdominal Pain    MARTRICE APT is a 43 y.o. female with  H/o HTN, acute pancreatitis, GERD, surgical hx lap tubal ligation presenting with a 3 day history of bilateral mid flank pain with radiation into her upper abdomen, right >left in association with nausea without emesis.  She endorses 2 episodes of nonbloody diarrhea, the first occurring 3 days ago, then again this am which has not reduced her sx.  She denies fevers or chills.  Has a history of etoh induced pancreatitis, reports had 4-5 drinks this weekend.  She has had no tx prior to arrival.  Denies dysuria or hematuria, denies pelvic pain or vaginal dc.    HPI     Past Medical History:  Diagnosis Date  . Alcohol abuse   . Bipolar 1 disorder (HCC)    starting to hear voices  . Hypertension   . MVP (mitral valve prolapse)   . Pancreatitis   . Seizures (HCC) 2009   stress induced    Patient Active Problem List   Diagnosis Date Noted  . Acute pancreatitis 05/07/2018  . Benign essential HTN 05/07/2018  . Dehydration 05/07/2018  . Chronic pain 05/07/2018  . GERD (gastroesophageal reflux disease) 05/07/2018  . Class 1 obesity due to excess calories with serious comorbidity and body mass index (BMI) of 32.0 to 32.9 in adult 05/07/2018  . Anxiety   . Cellulitis 08/20/2012  . Pain 08/20/2012  . Bipolar 1 disorder (HCC) 08/20/2012  . Alcohol abuse 05/03/2012  . Closed left ankle fracture 05/03/2012    Past Surgical History:  Procedure Laterality Date  . LAPAROSCOPIC TUBAL LIGATION  11/04/2012   Procedure: LAPAROSCOPIC TUBAL LIGATION;  Surgeon: Lazaro Arms, MD;  Location: [facility];  Service: Gynecology;  Laterality: Bilateral;  . NASAL SINUS SURGERY       OB History    Gravida  4   Para  4   Term  4   Preterm      AB      Living  4     SAB     TAB      Ectopic      Multiple      Live Births              History reviewed. No pertinent family history.  Social History   Tobacco Use  . Smoking status: Former Smoker    Years: 10.00    Types: Cigarettes  . Smokeless tobacco: Never Used  Substance Use Topics  . Alcohol use: Yes    Comment: 1-3 drinks per week  . Drug use: No    Home Medications Prior to Admission medications   Medication Sig Start Date End Date Taking? Authorizing Provider  acetaminophen (TYLENOL) 500 MG tablet Take 500 mg by mouth every 6 (six) hours as needed for moderate pain.   Yes [provider]  diphenhydrAMINE-APAP, sleep, (GOODY PM PO) Take 1 Dose by mouth daily as needed (sleep).   Yes [provider]  HYDROcodone-acetaminophen (NORCO/VICODIN) 5-325 MG tablet Take 1 tablet by mouth every 6 (six) hours as needed for moderate pain.   Yes [provider]  Melatonin ([employer] MELATONIN) 3 MG TABS Take 3 mg by mouth daily.   Yes [provider]  acamprosate (CAMPRAL) 333 MG tablet Take 666 mg by  mouth 3 (three) times daily. 04/12/18   [provider]  ALPRAZolam Prudy Feeler) 0.5 MG tablet Take 1 tablet (0.5 mg total) by mouth at bedtime as needed for anxiety or sleep. Patient not taking: Reported on 12/12/2019 05/09/18   Vassie Loll, MD  chlordiazePOXIDE (LIBRIUM) 10 MG capsule Take 2 capsules by mouth 3 times a day all for 2 days; then 1 capsule by mouth 3 times a day for 2 days; then 1 capsule by mouth twice a day for 2 days; then 1 capsule by mouth daily for 3 days; then stop librium. Patient not taking: Reported on 12/12/2019 05/09/18   Vassie Loll, MD  cloNIDine (CATAPRES) 0.1 MG tablet Take 1 tablet (0.1 mg total) by mouth daily. Patient not taking: Reported on 12/12/2019 05/09/18   Vassie Loll, MD  pantoprazole (PROTONIX) 40 MG tablet Take 1 tablet (40 mg total) by mouth daily. Patient not taking: Reported on 12/12/2019 05/09/18   Vassie Loll, MD    SUBOXONE 8-2 MG FILM Place 2 Film under the tongue 2 (two) times daily. 04/22/18   [provider]  vitamin B-12 (CYANOCOBALAMIN) 1000 MCG tablet Take 1 tablet (1,000 mcg total) by mouth daily. Patient not taking: Reported on 12/12/2019 05/09/18   Vassie Loll, MD    Allergies    Vicoprofen [hydrocodone-ibuprofen], Doxycycline, and Morphine and related  Review of Systems   Review of Systems  Constitutional: Negative for chills and fever.  HENT: Negative.   Eyes: Negative.   Respiratory: Negative for chest tightness and shortness of breath.   Cardiovascular: Negative for chest pain.  Gastrointestinal: Positive for abdominal pain, diarrhea and nausea. Negative for vomiting.  Genitourinary: Positive for flank pain. Negative for dysuria, pelvic pain, urgency and vaginal discharge.  Musculoskeletal: Negative for arthralgias, joint swelling and neck pain.  Skin: Negative.  Negative for rash and wound.  Neurological: Negative for dizziness, weakness, light-headedness, numbness and headaches.  Psychiatric/Behavioral: Negative.     Physical Exam Updated Vital Signs BP 136/83   Pulse 63   Temp 99.6 F (37.6 C) (Oral)   Resp 16   Ht 5\' 4"  (1.626 m)   Wt 90.7 kg   LMP 12/05/2019 Comment: neg   SpO2 97%   BMI 34.33 kg/m   Physical Exam Vitals and nursing note reviewed.  Constitutional:      Appearance: She is well-developed.  HENT:     Head: Normocephalic and atraumatic.  Eyes:     Conjunctiva/sclera: Conjunctivae normal.  Cardiovascular:     Rate and Rhythm: Normal rate and regular rhythm.     Heart sounds: Normal heart sounds.  Pulmonary:     Effort: Pulmonary effort is normal.     Breath sounds: Normal breath sounds. No wheezing.  Abdominal:     General: Bowel sounds are normal.     Palpations: Abdomen is soft.     Tenderness: There is abdominal tenderness in the right upper quadrant and left upper quadrant. There is guarding. There is no right CVA tenderness,  left CVA tenderness or rebound (guarding bilateral upper abd and periumbilical.  no distention, no increased tympany to percussion.). Positive signs include Murphy's sign.  Musculoskeletal:        General: Normal range of motion.     Cervical back: Normal range of motion.  Skin:    General: Skin is warm and dry.  Neurological:     Mental Status: She is alert.     ED Results / Procedures / Treatments   Labs (all labs  ordered are listed, but only abnormal results are displayed) Labs Reviewed  CBC WITH DIFFERENTIAL/PLATELET - Abnormal; Notable for the following components:      Result Value   Platelets 401 (*)    Neutro Abs 8.0 (*)    All other components within normal limits  COMPREHENSIVE METABOLIC PANEL - Abnormal; Notable for the following components:   Chloride 97 (*)    Glucose, Bld 120 (*)    Total Protein 8.5 (*)    All other components within normal limits  URINALYSIS, ROUTINE W REFLEX MICROSCOPIC - Abnormal; Notable for the following components:   Color, Urine AMBER (*)    APPearance TURBID (*)    Hgb urine dipstick SMALL (*)    Ketones, ur 20 (*)    Protein, ur 30 (*)    Bacteria, UA MANY (*)    All other components within normal limits  LIPASE, BLOOD - Abnormal; Notable for the following components:   Lipase 100 (*)    All other components within normal limits  PREGNANCY, URINE    EKG None  Radiology CT ABDOMEN PELVIS WO CONTRAST  Result Date: 12/12/2019 CLINICAL DATA:  43 year old female with acute abdominal and pelvic pain. EXAM: CT ABDOMEN AND PELVIS WITHOUT CONTRAST TECHNIQUE: Multidetector CT imaging of the abdomen and pelvis was performed following the standard protocol without IV contrast. COMPARISON:  12/12/2019 and 05/07/2018 ultrasounds. FINDINGS: Lower chest: No acute abnormality. Pectus excavatum deformity identified. Hepatobiliary: The liver and gallbladder are unremarkable. No biliary dilatation. Pancreas: There is mild inflammation adjacent to the  pancreatic body and tail compatible with acute pancreatitis. No acute collections are identified. No other pancreatic abnormalities are identified but pancreatic necrosis and venous thrombosis cannot be assessed on this noncontrast study. Spleen: Unremarkable Adrenals/Urinary Tract: The kidneys, adrenal glands and bladder are unremarkable. Stomach/Bowel: Stomach is within normal limits. Appendix appears normal. No evidence of bowel wall thickening, distention, or inflammatory changes. Vascular/Lymphatic: No significant vascular findings are present. No enlarged abdominal or pelvic lymph nodes. Reproductive: Uterus and bilateral adnexa are unremarkable. Other: No ascites, focal collection or pneumoperitoneum. Musculoskeletal: No acute or suspicious bony abnormalities. IMPRESSION: 1. Acute pancreatitis with mild peripancreatic inflammation adjacent to the body and tail. No acute fluid collections identified. 2. Pectus excavatum deformity. Electronically Signed   By: Margarette Canada M.D.   On: 12/12/2019 14:12   US Abdomen Complete  Result Date: 12/12/2019 CLINICAL DATA:  Bloating with mid abdominal and right flank pain for the past 3 days. EXAM: ABDOMEN ULTRASOUND COMPLETE COMPARISON:  Right upper quadrant ultrasound dated May 07, 2018. FINDINGS: Gallbladder: No gallstones or wall thickening visualized. No sonographic Murphy sign noted by sonographer. Common bile duct: Diameter: 6 mm, within normal limits. Liver: No focal lesion identified. Within normal limits in parenchymal echogenicity. Portal vein is patent on color Doppler imaging with normal direction of blood flow towards the liver. IVC: No abnormality visualized. Pancreas: Visualized portion unremarkable. Spleen: Size and appearance within normal limits. Right Kidney: Length: 11.3 cm. Echogenicity within normal limits. No mass or hydronephrosis visualized. Left Kidney: Length: 11.8 cm. Echogenicity within normal limits. No mass or hydronephrosis visualized.  Abdominal aorta: No aneurysm visualized. Other findings: None. IMPRESSION: Normal abdominal ultrasound. Electronically Signed   By: Titus Dubin M.D.   On: 12/12/2019 11:55    Procedures Procedures (including critical care time)  Medications Ordered in ED Medications  fentaNYL (SUBLIMAZE) injection 50 mcg (50 mcg Intravenous Given 12/12/19 1111)  ondansetron (ZOFRAN) injection 4 mg (4 mg Intravenous Given 12/12/19  [id number])    ED Course  I have reviewed the triage vital signs and the nursing notes.  Pertinent labs & imaging results that were available during my care of the patient were reviewed by me and considered in my medical decision making (see chart for details).    MDM Rules/Calculators/A&P                     Labs and imaging reviewed and discussed with patient.  We also discussed her home medications which include Suboxone.  She states she has not been an active patient of the Suboxone clinic in 2 years, but states she does have some Suboxone tablets left over as she takes them rarely, she does endorse taking a tablet about 4 days ago.  Denies any current narcotic abuse.  We also discussed her elevated blood pressures here.  She does have the means of checking her blood pressures at home she states they are usually in the 130/90 range.  She was advised to have a recheck of her blood pressure within the next week.  She was given referrals to several local clinics here in [place] for this and also to establish primary care.   Pt with acute pancreatitis, probably etoh induced.  Discussed home tx including pancreas rest, clear liquids.  Phenergan prescribed for n/v.  Discussed strict return precautions including worse pain, uncontrolled vomiting, weakness.  Patient was given p.o. fluids here which she tolerated.  Final Clinical Impression(s) / ED Diagnoses Final diagnoses:  Alcohol-induced acute pancreatitis, unspecified complication status  Hypertension, unspecified type    Rx /  DC Orders ED Discharge Orders    None       Victoriano Laindol, Nyazia Canevari, PA-C 12/12/19 1450    Vanetta MuldersZackowski, Scott, MD 12/16/19 [phone]

## 2019-12-12 NOTE — ED Notes (Signed)
[place] has been called jn for [place]

## 2019-12-12 NOTE — Discharge Instructions (Addendum)
Maintain a clear liquid diet for the next 1-2 days, then slowly increase your oral intake as your symptoms allow.  Doing this will help to "rest" your pancreas and allow it to heal.  You may take medicine prescribed for assistance with your nausea.  Return here if you develop worsened pain, uncontrolled vomiting or development of increased weakness or dizziness.    As discussed, your blood pressure has been elevated here on several readings and should be rechecked within a week (once your symptoms are better).

## 2020-08-22 ENCOUNTER — Other Ambulatory Visit: Payer: Self-pay

## 2020-08-22 ENCOUNTER — Encounter: Payer: Self-pay | Admitting: Nurse Practitioner

## 2020-08-22 ENCOUNTER — Ambulatory Visit (INDEPENDENT_AMBULATORY_CARE_PROVIDER_SITE_OTHER): Payer: Medicaid Other | Admitting: Nurse Practitioner

## 2020-08-22 VITALS — BP 128/78 | HR 105 | Temp 95.1°F | Ht 63.0 in | Wt 216.6 lb

## 2020-08-22 DIAGNOSIS — L719 Rosacea, unspecified: Secondary | ICD-10-CM

## 2020-08-22 DIAGNOSIS — K219 Gastro-esophageal reflux disease without esophagitis: Secondary | ICD-10-CM

## 2020-08-22 MED ORDER — OMEPRAZOLE 40 MG PO CPDR: 40.0000 mg | DELAYED_RELEASE_CAPSULE | Freq: Every day | ORAL | 2 refills | Status: DC

## 2020-08-22 MED ORDER — METRONIDAZOLE 0.75 % EX CREA
TOPICAL_CREAM | Freq: Two times a day (BID) | CUTANEOUS | 0 refills | Status: DC
Start: 2020-08-22 — End: 2021-10-23

## 2020-08-22 NOTE — Patient Instructions (Addendum)
[facility]  Gastroesophageal Reflux Disease, Adult Gastroesophageal reflux (GER) happens when acid from the stomach flows up into the tube that connects the mouth and the stomach (esophagus). Normally, food travels down the esophagus and stays in the stomach to be digested. With GER, food and stomach acid sometimes move back up into the esophagus. You may have a disease called gastroesophageal reflux disease (GERD) if the reflux:  Happens often.  Causes frequent or very bad symptoms.  Causes problems such as damage to the esophagus. When this happens, the esophagus becomes sore and swollen (inflamed). Over time, GERD can make small holes (ulcers) in the lining of the esophagus. What are the causes? This condition is caused by a problem with the muscle between the esophagus and the stomach. When this muscle is weak or not normal, it does not close properly to keep food and acid from coming back up from the stomach. The muscle can be weak because of:  Tobacco use.  Pregnancy.  Having a certain type of hernia (hiatal hernia).  Alcohol use.  Certain foods and drinks, such as coffee, chocolate, onions, and peppermint. What increases the risk? You are more likely to develop this condition if you:  Are overweight.  Have a disease that affects your connective tissue.  Use NSAID medicines. What are the signs or symptoms? Symptoms of this condition include:  Heartburn.  Difficult or painful swallowing.  The feeling of having a lump in the throat.  A bitter taste in the mouth.  Bad breath.  Having a lot of saliva.  Having an upset or bloated stomach.  Belching.  Chest pain. Different conditions can cause chest pain. Make sure you see your doctor if you have chest pain.  Shortness of breath or noisy breathing (wheezing).  Ongoing (chronic) cough or a cough at night.  Wearing away of the surface of teeth (tooth enamel).  Weight loss. How is this treated? Treatment  will depend on how bad your symptoms are. Your doctor may suggest:  Changes to your diet.  Medicine.  Surgery. Follow these instructions at home: Eating and drinking   Follow a diet as told by your doctor. You may need to avoid foods and drinks such as: ? Coffee and tea (with or without caffeine). ? Drinks that contain alcohol. ? Energy drinks and sports drinks. ? Bubbly (carbonated) drinks or sodas. ? Chocolate and cocoa. ? Peppermint and mint flavorings. ? Garlic and onions. ? Horseradish. ? Spicy and acidic foods. These include peppers, chili powder, curry powder, vinegar, hot sauces, and BBQ sauce. ? Citrus fruit juices and citrus fruits, such as oranges, lemons, and limes. ? Tomato-based foods. These include red sauce, chili, salsa, and pizza with red sauce. ? Fried and fatty foods. These include donuts, french fries, potato chips, and high-fat dressings. ? High-fat meats. These include hot dogs, rib eye steak, sausage, ham, and bacon. ? High-fat dairy items, such as whole milk, butter, and cream cheese.  Eat small meals often. Avoid eating large meals.  Avoid drinking large amounts of liquid with your meals.  Avoid eating meals during the 2-3 hours before bedtime.  Avoid lying down right after you eat.  Do not exercise right after you eat. Lifestyle   Do not use any products that contain nicotine or tobacco. These include cigarettes, e-cigarettes, and chewing tobacco. If you need help quitting, ask your doctor.  Try to lower your stress. If you need help doing this, ask your doctor.  If you are overweight,  lose an amount of weight that is healthy for you. Ask your doctor about a safe weight loss goal. General instructions  Pay attention to any changes in your symptoms.  Take over-the-counter and prescription medicines only as told by your doctor. Do not take aspirin, ibuprofen, or other NSAIDs unless your doctor says it is okay.  Wear loose clothes. Do not wear  anything tight around your waist.  Raise (elevate) the head of your bed about 6 inches (15 cm).  Avoid bending over if this makes your symptoms worse.  Keep all follow-up visits as told by your doctor. This is important. Contact a doctor if:  You have new symptoms.  You lose weight and you do not know why.  You have trouble swallowing or it hurts to swallow.  You have wheezing or a cough that keeps happening.  Your symptoms do not get better with treatment.  You have a hoarse voice. Get help right away if:  You have pain in your arms, neck, jaw, teeth, or back.  You feel sweaty, dizzy, or light-headed.  You have chest pain or shortness of breath.  You throw up (vomit) and your throw-up looks like blood or coffee grounds.  You pass out (faint).  Your poop (stool) is bloody or black.  You cannot swallow, drink, or eat. Summary  If a person has gastroesophageal reflux disease (GERD), food and stomach acid move back up into the esophagus and cause symptoms or problems such as damage to the esophagus.  Treatment will depend on how bad your symptoms are.  Follow a diet as told by your doctor.  Take all medicines only as told by your doctor. This information is not intended to replace advice given to you by your health care provider. Make sure you discuss any questions you have with your health care provider. Document Revised: 06/17/2018 Document Reviewed: 06/17/2018 Elsevier Patient Education  2020 [employer]  Food Choices for Gastroesophageal Reflux Disease, Adult When you have gastroesophageal reflux disease (GERD), the foods you eat and your eating habits are very important. Choosing the right foods can help ease your discomfort. Think about working with a nutrition specialist (dietitian) to help you make good choices. What are tips for following this plan?  Meals  Choose healthy foods that are low in fat, such as fruits, vegetables, whole grains, low-fat dairy  products, and lean meat, fish, and poultry.  Eat small meals often instead of 3 large meals a day. Eat your meals slowly, and in a place where you are relaxed. Avoid bending over or lying down until 2-3 hours after eating.  Avoid eating meals 2-3 hours before bed.  Avoid drinking a lot of liquid with meals.  Cook foods using methods other than frying. Bake, grill, or broil food instead.  Avoid or limit: ? Chocolate. ? Peppermint or spearmint. ? Alcohol. ? Pepper. ? Black and decaffeinated coffee. ? Black and decaffeinated tea. ? Bubbly (carbonated) soft drinks. ? Caffeinated energy drinks and soft drinks.  Limit high-fat foods such as: ? Fatty meat or fried foods. ? Whole milk, cream, butter, or ice cream. ? Nuts and nut butters. ? Pastries, donuts, and sweets made with butter or shortening.  Avoid foods that cause symptoms. These foods may be different for everyone. Common foods that cause symptoms include: ? Tomatoes. ? Oranges, lemons, and limes. ? Peppers. ? Spicy food. ? Onions and garlic. ? Vinegar. Lifestyle  Maintain a healthy weight. Ask your doctor what weight is healthy for  you. If you need to lose weight, work with your doctor to do so safely.  Exercise for at least 30 minutes for 5 or more days each week, or as told by your doctor.  Wear loose-fitting clothes.  Do not smoke. If you need help quitting, ask your doctor.  Sleep with the head of your bed higher than your feet. Use a wedge under the mattress or blocks under the bed frame to raise the head of the bed. Summary  When you have gastroesophageal reflux disease (GERD), food and lifestyle choices are very important in easing your symptoms.  Eat small meals often instead of 3 large meals a day. Eat your meals slowly, and in a place where you are relaxed.  Limit high-fat foods such as fatty meat or fried foods.  Avoid bending over or lying down until 2-3 hours after eating.  Avoid peppermint and  spearmint, caffeine, alcohol, and chocolate. This information is not intended to replace advice given to you by your health care provider. Make sure you discuss any questions you have with your health care provider. Document Revised: 04/01/2019 Document Reviewed: 01/14/2017 Elsevier Patient Education  2020 Elsevier Inc.  Rosacea Rosacea is a long-term (chronic) condition that affects the skin of the face, including the cheeks, nose, forehead, and chin. This condition can also affect the eyes. Rosacea causes blood vessels near the surface of the skin to enlarge, which results in redness. What are the causes? The cause of this condition is not known. Certain triggers can make rosacea worse, including: Hot baths. Exercise. Sunlight. Very hot or cold temperatures. Hot or spicy foods and drinks. Drinking alcohol. Stress. Taking blood pressure medicine. Long-term use of topical steroids on the face. What increases the risk? You are more likely to develop this condition if you: Are older than 43 years of age. Are a woman. Have light-colored skin (light complexion). Have a family history of rosacea. What are the signs or symptoms? Symptoms of this condition include: Redness of the face. Red bumps or pimples on the face. A red, enlarged nose. Blushing easily. Red lines on the skin. Irritated, burning, or itchy feeling in the eyes. Swollen eyelids. Drainage from the eyes. Feeling like there is something in your eye. How is this diagnosed? This condition is diagnosed with a medical history and physical exam. How is this treated? There is no cure for this condition, but treatment can help to control your symptoms. Your health care provider may recommend that you see a skin specialist (dermatologist). Treatment may include: Medicines that are applied to the skin or taken by mouth (orally). This can include antibiotic medicines. Laser treatment to improve the appearance of the  skin. Surgery. This is rare. Your health care provider will also recommend the best way to take care of your skin. Even after your skin improves, you will likely need to continue treatment to prevent your rosacea from coming back. Follow these instructions at home: Skin care Take care of your skin as told by your health care provider. You may be told to do these things: Wash your skin gently two or more times each day. Use mild soap. Use a sunscreen or sunblock with SPF 30 or greater. Use gentle cosmetics that are meant for sensitive skin. Shave with an electric shaver instead of a blade. Lifestyle Try to keep track of what foods trigger this condition. Avoid any triggers. These may include: Spicy foods. Seafood. Cheese. Hot liquids. Nuts. Chocolate. Iodized salt. Do not drink alcohol.  Avoid extremely cold or hot temperatures. Try to reduce your stress. If you need help, talk with your health care provider. When you exercise, do these things to stay cool: Limit sun exposure to your face. Use a fan. Do shorter and more frequent intervals of exercise. General instructions Take and apply over-the-counter and prescription medicines only as told by your health care provider. If you were prescribed an antibiotic medicine, apply it or take it as told by your health care provider. Do not stop using the antibiotic even if your condition improves. If your eyelids are affected, apply warm compresses to them. Do this as told by your health care provider. Keep all follow-up visits as told by your health care provider. This is important. Contact a health care provider if: Your symptoms get worse. Your symptoms do not improve after 2 months of treatment. You have new symptoms. You have any changes in vision or you have problems with your eyes, such as redness or itching. You feel depressed. You lose your appetite. You have trouble concentrating. Summary Rosacea is a long-term (chronic)  condition that affects the skin of the face, including the cheeks, nose, forehead, and chin. Take care of your skin as told by your health care provider. Take and apply over-the-counter and prescription medicines only as told by your health care provider. Contact a health care provider if your symptoms get worse or if you have any changes in vision or other problems with your eyes, such as redness or itching. Keep all follow-up visits as told by your health care provider. This is important. This information is not intended to replace advice given to you by your health care provider. Make sure you discuss any questions you have with your health care provider. Document Revised: 05/13/2018 Document Reviewed: 05/13/2018 Elsevier Patient Education  2020 [employer].

## 2020-08-23 ENCOUNTER — Encounter: Payer: Self-pay | Admitting: Nurse Practitioner

## 2020-08-23 NOTE — Progress Notes (Signed)
Subjective: Presents to establish herself as a new patient to the practice.  Patient is uninsured at this point, no longer has Medicaid.  States her new job has insurance but it is too expensive for her to pay out-of-pocket at this point.  Has several concerns today to discuss. Has a history of alcoholism, is not consuming any alcohol at this time. Has quit smoking but started vaping after this, stopped this about 2 months ago. Has developed a rash on her face since starting her new job.  Mainly around the center of the face.  Slightly tender at times.  Minimally pruritic.  Rash will mostly resolve on the weekend but comes back when she goes to work.  No known allergens.  Does not have to wear a mask at work.  Has tried OTC topicals with no improvement.  Limited to the facial area. Complaints of heartburn for the past 2 to 3 weeks.  Was taking OTC Prilosec but this was expensive.  Main symptoms are during the night she will wake up with reflux and hurting in the mid chest area.  Occasional caffeine intake, mainly drinks water.  Occasional spicy foods.  Does have a lot of citrus in her diet.  Symptoms are worse when she eats rice.  No association with intake of meat products.  Has been taking a lot of Goody powder p.m. to help her sleep.  Describes her stress as being very reasonable, 4 out of 10.  No change in bowel habits or change in the color of her stools.  Also concerned about her BP. Max BP 130s/80-90s. No CP/ischemic type pain, SOB.  FMH: mother HTN Past Medical History:  Diagnosis Date  . Alcohol abuse   . Bipolar 1 disorder (HCC)    starting to hear voices  . Hypertension   . MVP (mitral valve prolapse)   . Pancreatitis   . Seizures (HCC) 2009   stress induced    Objective:   NAD. Alert, oriented. Lungs clear. Heart RRR. Abdomen soft, non distended non tender. LE: no edema. Confluent mild erythema with multiple discrete papules noted between eyebrows, cheeks and chin.  Today's Vitals    08/22/20 1540  BP: 128/78  Pulse: (!) 105  Temp: (!) 95.1 F (35.1 C)  TempSrc: Temporal  Weight: 216 lb 9.6 oz (98.2 kg)  Height: 5\' 3"  (1.6 m)   Body mass index is 38.37 kg/m. Has gained 16 lbs since December.    Assessment/Plan:   Problem List Items Addressed This Visit      Digestive   GERD (gastroesophageal reflux disease) - Primary (Chronic)   Relevant Medications   omeprazole (PRILOSEC) 40 MG capsule     Musculoskeletal and Integument   Acne rosacea     Other   Morbid obesity (HCC)     Meds ordered this encounter  Medications  . metroNIDAZOLE (METROCREAM) 0.75 % cream    Sig: Apply topically 2 (two) times daily. To facial rash prn    Dispense:  45 g    Refill:  0    Order Specific Question:   Supervising Provider    Answer:   January A [[id number]]  . omeprazole (PRILOSEC) 40 MG capsule    Sig: Take 1 capsule (40 mg total) by mouth daily. Prn acid reflux    Dispense:  30 capsule    Refill:  2    Order Specific Question:   Supervising Provider    Answer:   Lilyan Punt A [[id number]]  Defers preventive health physical and labs due to finances.  Encouraged patient to look into [facility]. Given written and verbal information on GERD. Call back in 2-3 weeks if no improvement in her symptoms.  Use [employer] or other Rx discount program to help with med costs.  No medication for BP at this time. Set goal of 10 lbs of weight loss over the next 3 months.  Increase daily activity.  Avoid sugar and simple carbs in diet.  Return if symptoms worsen or fail to improve.

## 2020-09-06 IMAGING — CT CT ABD-PELV W/O CM
2 of 4 series · 16 of 46 positions shown, 18 images · non-contrast
Comparison: 12/12/2019 and 05/07/2018 ultrasounds.

CLINICAL DATA: 43-year-old female with acute abdominal and pelvic
pain.

EXAM:
CT ABDOMEN AND PELVIS WITHOUT CONTRAST
TECHNIQUE: Multidetector CT imaging of the abdomen and pelvis was performed
following the standard protocol without IV contrast.

[Series 2: axial st · axial · 0.71mm/px · z∈[+922,+1392]mm · 13 of 106 slices shown, 15 images]
[im 6/106  soft-tissue]
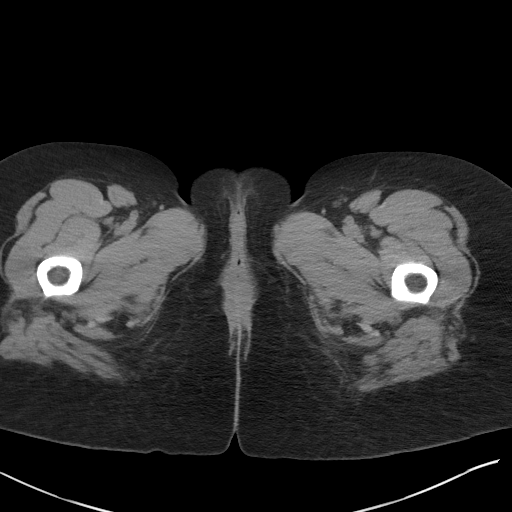
[im 6/106  bone]
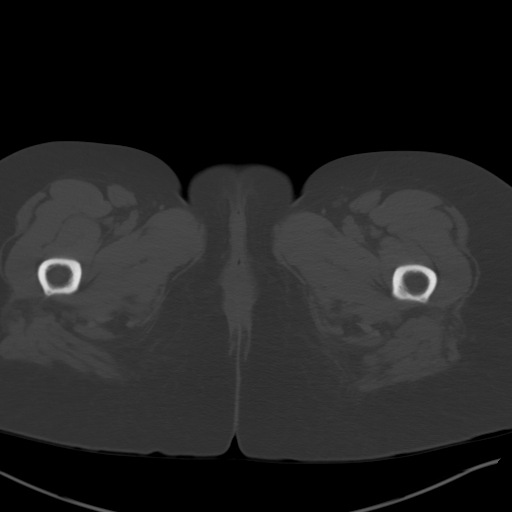
[im 12/106  soft-tissue]
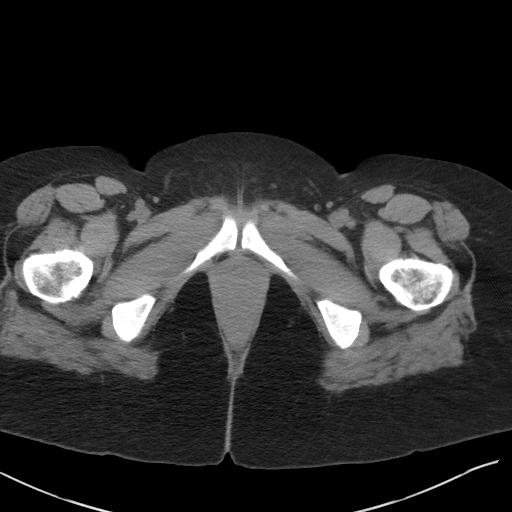
[im 24/106  soft-tissue]
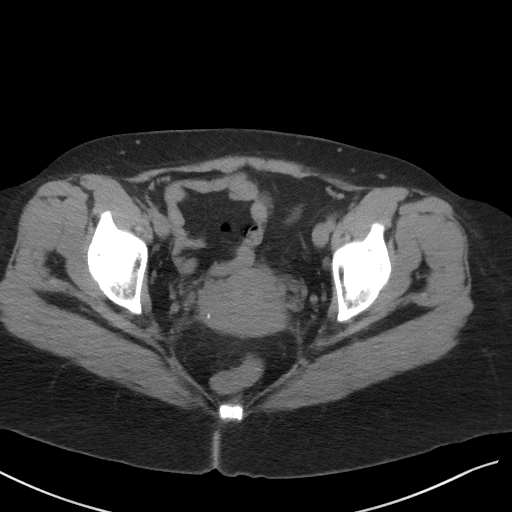
[im 30/106  soft-tissue]
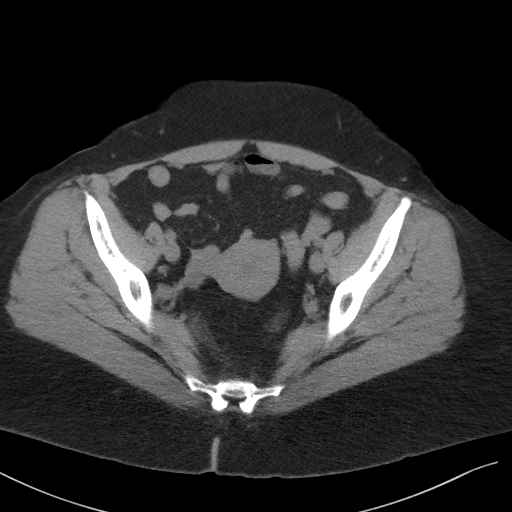
[im 36/106  soft-tissue]
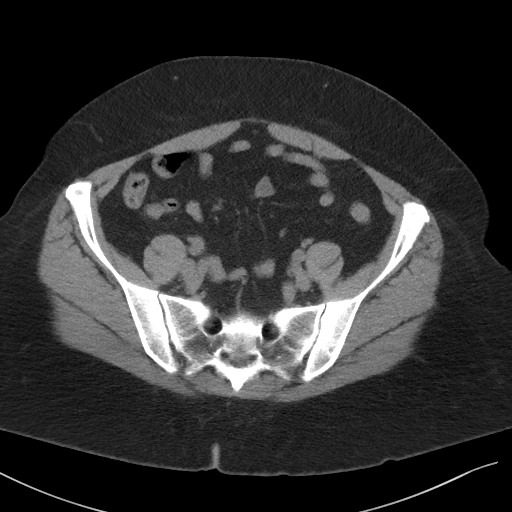
[im 47/106  soft-tissue]
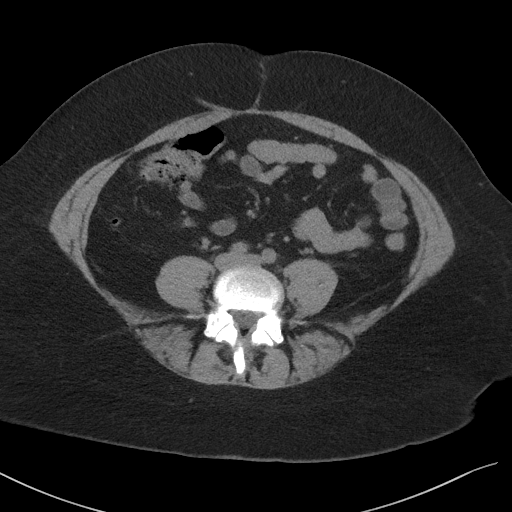
[im 53/106  soft-tissue]
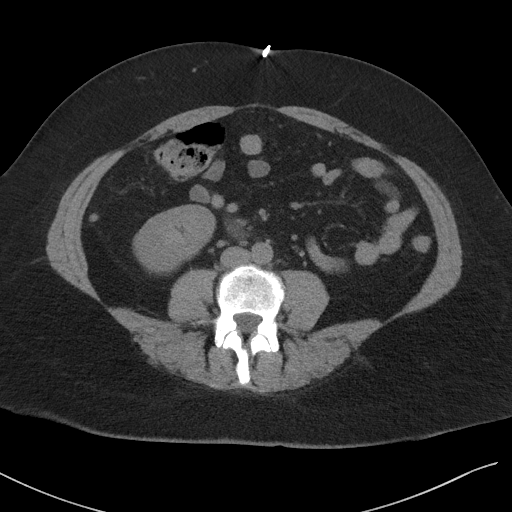
[im 59/106  soft-tissue]
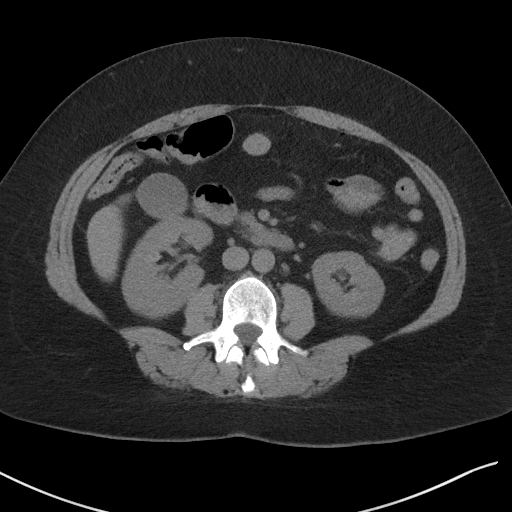
[im 71/106  soft-tissue]
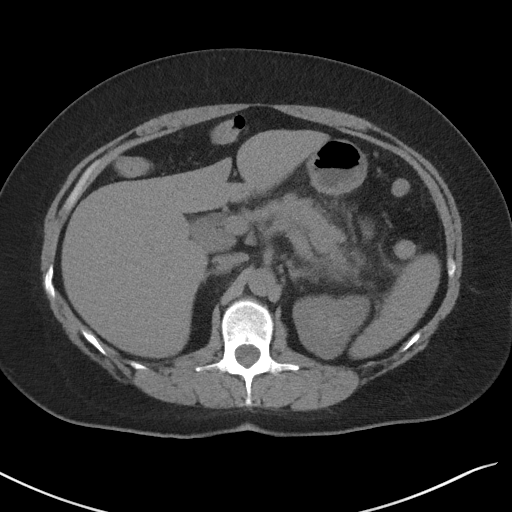
[im 71/106  bone]
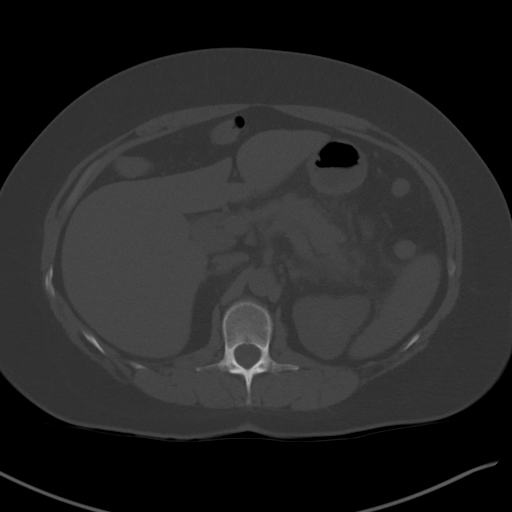
[im 76/106  soft-tissue]
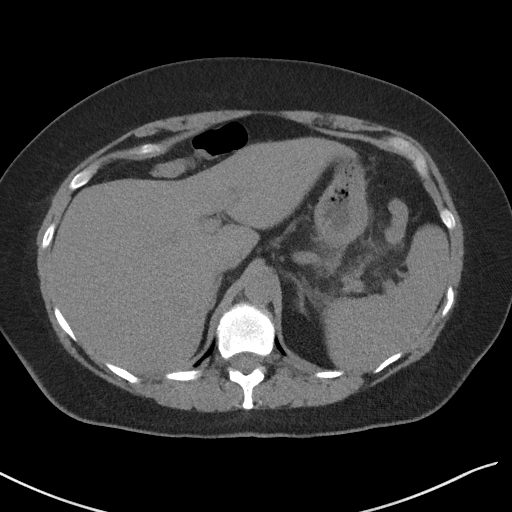
[im 82/106  soft-tissue]
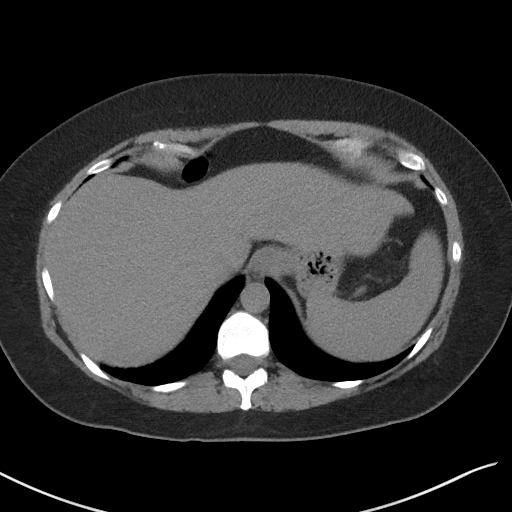
[im 94/106  soft-tissue]
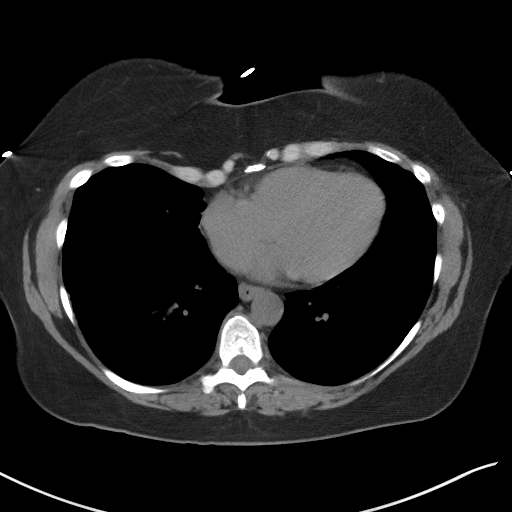
[im 100/106  soft-tissue]
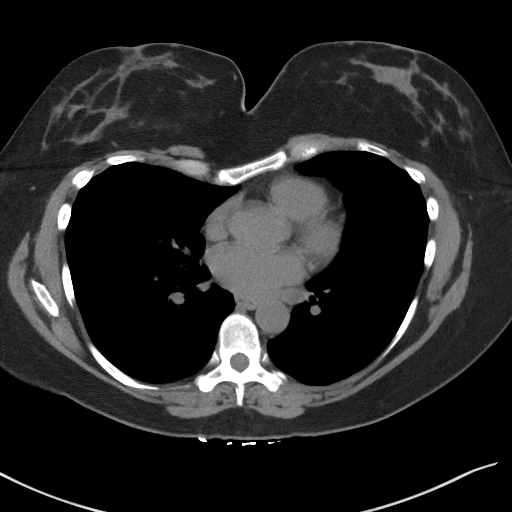

[Series 5: coronal st · coronal · 0.75mm/px · 3 of 132 slices shown]
[im 44/132  soft-tissue]
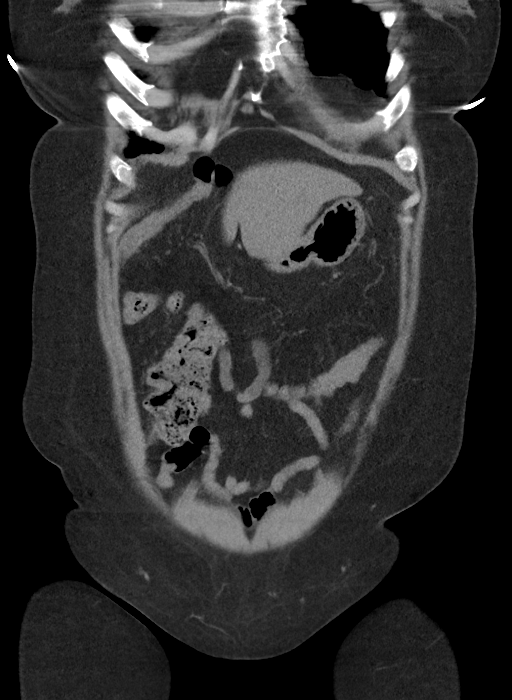
[im 59/132  soft-tissue]
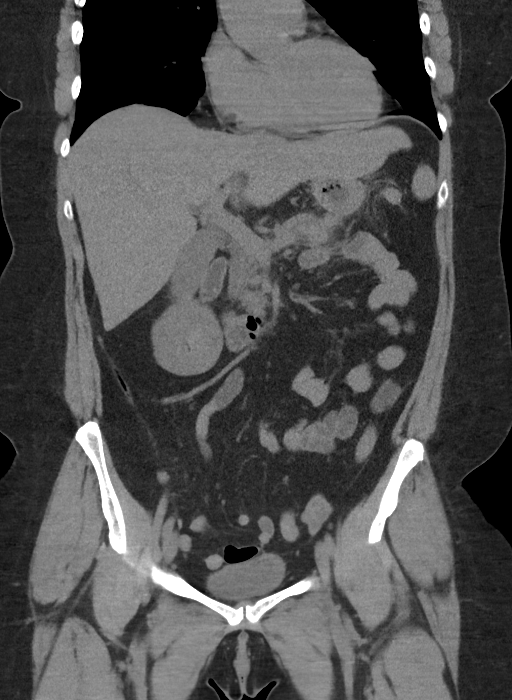
[im 73/132  soft-tissue]
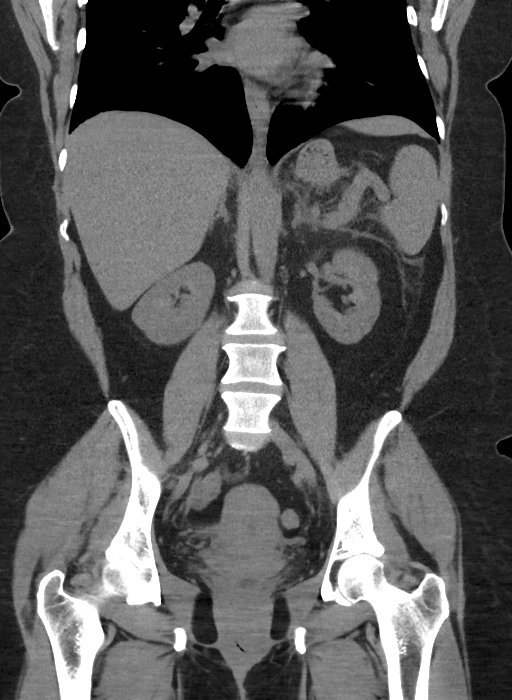

[16 of 46 positions shown; findings below may reference images not displayed]

FINDINGS: Lower chest: No acute abnormality. Pectus excavatum deformity
identified.

Hepatobiliary: The liver and gallbladder are unremarkable. No
biliary dilatation.

Pancreas: There is mild inflammation adjacent to the pancreatic body
and tail compatible with acute pancreatitis. No acute collections
are identified. No other pancreatic abnormalities are identified but
pancreatic necrosis and venous thrombosis cannot be assessed on this
noncontrast study.

Spleen: Unremarkable

Adrenals/Urinary Tract: The kidneys, adrenal glands and bladder are
unremarkable.

Stomach/Bowel: Stomach is within normal limits. Appendix appears
normal. No evidence of bowel wall thickening, distention, or
inflammatory changes.

Vascular/Lymphatic: No significant vascular findings are present. No
enlarged abdominal or pelvic lymph nodes.

Reproductive: Uterus and bilateral adnexa are unremarkable.

Other: No ascites, focal collection or pneumoperitoneum.

Musculoskeletal: No acute or suspicious bony abnormalities.
IMPRESSION: 1. Acute pancreatitis with mild peripancreatic inflammation adjacent
to the body and tail. No acute fluid collections identified.
2. Pectus excavatum deformity.

## 2021-02-23 ENCOUNTER — Encounter: Payer: Self-pay | Admitting: Nurse Practitioner

## 2021-02-23 ENCOUNTER — Other Ambulatory Visit: Payer: Self-pay

## 2021-02-23 ENCOUNTER — Ambulatory Visit: Payer: Self-pay | Admitting: Nurse Practitioner

## 2021-02-23 VITALS — HR 99 | Temp 98.0°F | Ht 63.0 in | Wt 208.8 lb

## 2021-02-23 DIAGNOSIS — L719 Rosacea, unspecified: Secondary | ICD-10-CM

## 2021-02-23 MED ORDER — DOXYCYCLINE HYCLATE 100 MG PO TABS
100.0000 mg | ORAL_TABLET | Freq: Two times a day (BID) | ORAL | 0 refills | Status: DC
Start: 2021-02-23 — End: 2021-02-28

## 2021-02-23 MED ORDER — TRIAMCINOLONE ACETONIDE 0.1 % EX CREA: 1.0000 "application " | TOPICAL_CREAM | Freq: Two times a day (BID) | CUTANEOUS | 0 refills | Status: DC

## 2021-02-23 NOTE — Progress Notes (Signed)
   Subjective:    Patient ID: Brittany [name], female    DOB: [birth year], 45 y.o.   MRN: [id number]  HPI  Patient arrives with continued rash on face- Ointment from last visit did not help. States rash has been persistent and is slowly gotten worse.  Has not identified any aggravating factors.  Minimal improvement with metronidazole.  Last office visit for rash was 08/22/2020 see previous notes. Rash is mildly pruritic at times.  Slightly tender.  Describes some flakiness around some of the lesions especially in the morning.  Rash is limited to the face, has not noticed it anywhere else on the body.  Review of Systems     Objective:   Physical Exam NAD.  Alert, oriented.  Confluent erythema noted in the mid facial area from the mid forehead down the bridge of the nose and around the mouth to the chin area.  The sides of the face are not involved.  Multiple lesions noted in this area including papules and a few tiny pustules.  Nontender to palpation.  Minimal peeling noted.  There are also a few lesions at the vermilion border.  No eye involvement.       Assessment & Plan:   Problem List Items Addressed This Visit      Musculoskeletal and Integument   Acne rosacea - Primary     Note that patient is uninsured. Meds ordered this encounter  Medications  . doxycycline (VIBRA-TABS) 100 MG tablet    Sig: Take 1 tablet (100 mg total) by mouth 2 (two) times daily.    Dispense:  30 tablet    Refill:  0    Order Specific Question:   Supervising Provider    Answer:   Lilyan Punt A [[id number]]  . triamcinolone (KENALOG) 0.1 %    Sig: Apply 1 application topically 2 (two) times daily. Prn rash; use up to 2 weeks    Dispense:  30 g    Refill:  0    Order Specific Question:   Supervising Provider    Answer:   Lilyan Punt A [[id number]]   Trial of doxycycline and topical triamcinolone.  Note that patient had nausea with doxycycline but no true allergic reaction.  Recommend patient drink a large  amount of fluids after taking doxycycline to make sure it clears the esophagus.  Also take with food if needed.  Patient to try this for 2 weeks and if no significant improvement, to call back to the office for dermatology referral but would like to hold on that if possible due to finances.

## 2021-02-24 ENCOUNTER — Encounter: Payer: Self-pay | Admitting: Nurse Practitioner

## 2021-02-26 ENCOUNTER — Telehealth: Payer: Self-pay

## 2021-02-26 NOTE — Telephone Encounter (Signed)
Seen for acne rosacea

## 2021-02-26 NOTE — Telephone Encounter (Signed)
Pt was seen Friday by Eber Jones and was prescribed doxycycline (VIBRA-TABS) 100 MG tablet this has not worked in the past and she was going to try her back on this again unfortunately she is having stomach pain and vomiting. Wants to see if something else can be called into [facility]   Call back [phone]

## 2021-02-27 ENCOUNTER — Encounter: Payer: Self-pay | Admitting: Nurse Practitioner

## 2021-02-28 ENCOUNTER — Other Ambulatory Visit: Payer: Self-pay | Admitting: Nurse Practitioner

## 2021-02-28 MED ORDER — MINOCYCLINE HCL 100 MG PO CAPS: 100.0000 mg | ORAL_CAPSULE | Freq: Two times a day (BID) | ORAL | 0 refills | Status: DC

## 2021-02-28 NOTE — Telephone Encounter (Signed)
Nurses  Please inform Mahogani that Eber Jones is out currently  A second option to her medication choices would be clarithromycin 250 mg 1 taken twice daily, #60, 2 refills Take with a snack in a glass of water Follow-up with Eber Jones in 4 to 6 weeks to check on effectiveness  If this medicine is not effective or if the patient cannot tolerate this medicine next step would be dermatology consult  Lexii can certainly let us know what she would like to do and if she does desire referral to dermatology to let us know and we will help initiate that referral  Thanks-Dr. Lorin Picket

## 2021-02-28 NOTE — Telephone Encounter (Signed)
Pt contacted and verbalized understanding. Also, sent message to pt via MyChart

## 2021-02-28 NOTE — Telephone Encounter (Signed)
Will switch her a similar medication that hopefully will be tolerated better. Recommend she use the [place] app for prices. Very affordable. Start Minocycline twice a day. No milk products around the time she takes medicine. If this works, she can cut back to once a day if needed. Any problems, stop medication and contact office. Let me know if a couple of weeks if no improvement. Thanks. I will send this in today.

## 2021-03-01 MED ORDER — CLARITHROMYCIN 250 MG PO TABS: ORAL_TABLET | ORAL | 2 refills | Status: DC

## 2021-03-01 NOTE — Addendum Note (Signed)
Addended by: Marlowe Shores on: 03/01/2021 08:14 AM   Modules accepted: Orders

## 2021-03-06 ENCOUNTER — Telehealth: Payer: Self-pay

## 2021-03-06 NOTE — Telephone Encounter (Signed)
Pt was seen Friday before last with Eber Jones  and meds made her sick so [facility] sent a new RX in when she went to pick it up there was two antibiotics  minocycline (MINOCIN) 100 MG capsule, clarithromycin (BIAXIN) 250 MG tablet whic medication is she to take?   Pt call back [phone]

## 2021-03-08 ENCOUNTER — Other Ambulatory Visit: Payer: Self-pay | Admitting: Nurse Practitioner

## 2021-03-09 ENCOUNTER — Encounter: Payer: Self-pay | Admitting: Nurse Practitioner

## 2021-03-09 NOTE — Telephone Encounter (Signed)
I am not sure where the Biaxin rx came from. I prescribed Minocycline for her facial rash. Has she taken this? Is it making her sick?

## 2021-03-12 NOTE — Telephone Encounter (Signed)
So this is a byproduct of messages coming when Eber Jones is not present.  I handled the message and switched her over to Biaxin.  Now she has a prescription for minocycline.  I would recommend canceling the Biaxin, trying the minocycline but if it also causes significant side effects then reconnect with Eber Jones.

## 2021-03-12 NOTE — Telephone Encounter (Signed)
Patient spoke with Brittany [name] (thru my chart) who advised patient to take the minocycline and not to take the biaxin- Patient to update Brittany [name] this week and let her know how the medication is doing.

## 2021-07-06 ENCOUNTER — Encounter: Payer: Self-pay | Admitting: Nurse Practitioner

## 2021-07-06 ENCOUNTER — Other Ambulatory Visit: Payer: Self-pay | Admitting: Nurse Practitioner

## 2021-07-06 MED ORDER — ONDANSETRON 4 MG PO TBDP: ORAL_TABLET | ORAL | 3 refills | Status: DC

## 2021-07-06 MED ORDER — OMEPRAZOLE 40 MG PO CPDR: DELAYED_RELEASE_CAPSULE | ORAL | 0 refills | Status: DC

## 2021-07-06 MED ORDER — OMEPRAZOLE 40 MG PO CPDR: 40.0000 mg | DELAYED_RELEASE_CAPSULE | Freq: Every day | ORAL | 1 refills | Status: DC

## 2021-07-06 NOTE — Telephone Encounter (Signed)
Zofran ODT 4 mg, #15, 3 refills, 1 taken 3 times daily as needed for nausea, if ongoing troubles with her abdomen recommend follow-up office visit

## 2021-07-06 NOTE — Addendum Note (Signed)
Addended by: Marlowe Shores on: 07/06/2021 01:07 PM   Modules accepted: Orders

## 2021-09-10 ENCOUNTER — Encounter (HOSPITAL_COMMUNITY): Payer: Self-pay

## 2021-09-10 ENCOUNTER — Emergency Department (HOSPITAL_COMMUNITY)
Admission: EM | Admit: 2021-09-10 | Discharge: 2021-09-10 | Disposition: A | Discharge status: Home / Self Care | Payer: Self-pay | Attending: Emergency Medicine | Admitting: Emergency Medicine

## 2021-09-10 ENCOUNTER — Emergency Department (HOSPITAL_COMMUNITY): Payer: Self-pay

## 2021-09-10 ENCOUNTER — Other Ambulatory Visit: Payer: Self-pay

## 2021-09-10 DIAGNOSIS — I10 Essential (primary) hypertension: Secondary | ICD-10-CM | POA: Insufficient documentation

## 2021-09-10 DIAGNOSIS — R1012 Left upper quadrant pain: Secondary | ICD-10-CM | POA: Insufficient documentation

## 2021-09-10 DIAGNOSIS — Z87891 Personal history of nicotine dependence: Secondary | ICD-10-CM | POA: Insufficient documentation

## 2021-09-10 DIAGNOSIS — R739 Hyperglycemia, unspecified: Secondary | ICD-10-CM | POA: Insufficient documentation

## 2021-09-10 DIAGNOSIS — E871 Hypo-osmolality and hyponatremia: Secondary | ICD-10-CM | POA: Insufficient documentation

## 2021-09-10 DIAGNOSIS — E876 Hypokalemia: Secondary | ICD-10-CM | POA: Insufficient documentation

## 2021-09-10 DIAGNOSIS — R197 Diarrhea, unspecified: Secondary | ICD-10-CM | POA: Insufficient documentation

## 2021-09-10 LAB — CBC
HCT: 43.7 % (ref 36.0–46.0)
Hemoglobin: 13.7 g/dL (ref 12.0–15.0)
MCH: 28.4 pg (ref 26.0–34.0)
MCHC: 31.4 g/dL (ref 30.0–36.0)
MCV: 90.7 fL (ref 80.0–100.0)
Platelets: 323 10*3/uL (ref 150–400)
RBC: 4.82 MIL/uL (ref 3.87–5.11)
RDW: 15.3 % (ref 11.5–15.5)
WBC: 5.9 10*3/uL (ref 4.0–10.5)
nRBC: 0 % (ref 0.0–0.2)

## 2021-09-10 LAB — COMPREHENSIVE METABOLIC PANEL
ALT: 32 U/L (ref 0–44)
AST: 73 U/L — ABNORMAL HIGH (ref 15–41)
Albumin: 3.3 g/dL — ABNORMAL LOW (ref 3.5–5.0)
Alkaline Phosphatase: 184 U/L — ABNORMAL HIGH (ref 38–126)
Anion gap: 12 (ref 5–15)
BUN: 6 mg/dL (ref 6–20)
CO2: 29 mmol/L (ref 22–32)
Calcium: 7.7 mg/dL — ABNORMAL LOW (ref 8.9–10.3)
Chloride: 92 mmol/L — ABNORMAL LOW (ref 98–111)
Creatinine, Ser: 0.61 mg/dL (ref 0.44–1.00)
GFR, Estimated: >60 mL/min (ref >60)
Glucose, Bld: 138 mg/dL — ABNORMAL HIGH (ref 70–99)
Potassium: 3 mmol/L — ABNORMAL LOW (ref 3.5–5.1)
Sodium: 133 mmol/L — ABNORMAL LOW (ref 135–145)
Total Bilirubin: 1 mg/dL (ref 0.3–1.2)
Total Protein: 8.2 g/dL — ABNORMAL HIGH (ref 6.5–8.1)

## 2021-09-10 LAB — URINALYSIS, ROUTINE W REFLEX MICROSCOPIC
Glucose, UA: NEGATIVE mg/dL
Ketones, ur: 5 mg/dL — AB
Leukocytes,Ua: NEGATIVE
Nitrite: NEGATIVE
Protein, ur: 30 mg/dL — AB
Specific Gravity, Urine: 1.026 (ref 1.005–1.030)
pH: 6 (ref 5.0–8.0)

## 2021-09-10 LAB — POC URINE PREG, ED: Preg Test, Ur: NEGATIVE

## 2021-09-10 LAB — LIPASE, BLOOD: Lipase: 22 U/L (ref 11–51)

## 2021-09-10 MED ORDER — IOHEXOL 350 MG/ML SOLN
80.0000 mL | Freq: Once | INTRAVENOUS | Status: AC | PRN
  Administered 2021-09-10: 80 mL via INTRAVENOUS

## 2021-09-10 MED ORDER — ONDANSETRON HCL 4 MG PO TABS: 4.0000 mg | ORAL_TABLET | Freq: Four times a day (QID) | ORAL | 0 refills | Status: DC

## 2021-09-10 MED ORDER — FENTANYL CITRATE PF 50 MCG/ML IJ SOSY
100.0000 ug | PREFILLED_SYRINGE | Freq: Once | INTRAMUSCULAR | Status: AC
Start: 2021-09-10 — End: 2021-09-10
  Administered 2021-09-10: 100 ug via INTRAVENOUS
  Filled 2021-09-10: qty 2

## 2021-09-10 MED ORDER — DICYCLOMINE HCL 10 MG/ML IM SOLN
20.0000 mg | Freq: Once | INTRAMUSCULAR | Status: AC
  Administered 2021-09-10: 20 mg via INTRAMUSCULAR
  Filled 2021-09-10: qty 2

## 2021-09-10 NOTE — ED Notes (Signed)
Patient transported to CT 

## 2021-09-10 NOTE — ED Provider Notes (Signed)
[facility] Provider Note   CSN: [id number] Arrival date & time: 09/10/21  0854     History Chief Complaint  Patient presents with   Abdominal Pain    Brittany [name] is a 45 y.o. female.  HPI  Patient with significant medical history of alcohol abuse, bipolar, hypertension, pancreatitis presents  emergency department with chief complaint of stomach bloating x2 weeks.  Patient states she feels stomach bloating mainly in her upper abdomen, pain does not radiate, describes it as a pressure-like sensation, she has associated nausea, vomiting, diarrhea, she denies hematemesis, coffee-ground emesis, hematuria melena.  She has no urinary symptoms.  Patient has no significant abdominal history, states she has had pancreatitis in the past, has not had it since, she denies recent alcohol use, NSAID use, history of bowel obstructions, PUD, diverticulitis.  She states p.o. intake does not aggravate or relieve the pain.  She denies  alleviating factors.  Does not endorse headaches, fevers, chills, shortness of breath, chest pain, worsening pedal edema.  Past Medical History:  Diagnosis Date   Alcohol abuse    Bipolar 1 disorder ([place])    starting to hear voices   Hypertension    MVP (mitral valve prolapse)    Pancreatitis    Seizures ([place]) 2009   stress induced    Patient Active Problem List   Diagnosis Date Noted   Morbid obesity ([place]) 08/25/2020   Acne rosacea 08/22/2020   Acute pancreatitis 05/07/2018   Benign essential HTN 05/07/2018   Dehydration 05/07/2018   Chronic pain 05/07/2018   GERD (gastroesophageal reflux disease) 05/07/2018   Anxiety    Cellulitis 08/20/2012   Pain 08/20/2012   Bipolar 1 disorder ([place]) 08/20/2012   Alcohol abuse 05/03/2012   Closed left ankle fracture 05/03/2012    Past Surgical History:  Procedure Laterality Date   LAPAROSCOPIC TUBAL LIGATION  11/04/2012   Procedure: LAPAROSCOPIC TUBAL LIGATION;  Surgeon: Florian Buff,  MD;  Location: [facility];  Service: Gynecology;  Laterality: Bilateral;   NASAL SINUS SURGERY       OB History     Gravida  4   Para  4   Term  4   Preterm      AB      Living  4      SAB      IAB      Ectopic      Multiple      Live Births              Family History  Problem Relation Age of Onset   Hypertension Mother     Social History   Tobacco Use   Smoking status: Former    Years: 10.00    Types: Cigarettes    Quit date: 08/22/2017    Years since quitting: 4.0   Smokeless tobacco: Never  Vaping Use   Vaping Use: Former   Quit date: 06/21/2020  Substance Use Topics   Alcohol use: Yes    Comment: 1-3 drinks per week   Drug use: No    Home Medications Prior to Admission medications   Medication Sig Start Date End Date Taking? Authorizing Provider  ondansetron (ZOFRAN) 4 MG tablet Take 1 tablet (4 mg total) by mouth every 6 (six) hours. 09/10/21  Yes Marcello Fennel, PA-C  acamprosate (CAMPRAL) 333 MG tablet Take 666 mg by mouth 3 (three) times daily. 04/12/18   [provider]  acetaminophen (TYLENOL) 500 MG tablet Take  500 mg by mouth every 6 (six) hours as needed for moderate pain.    [provider]  clarithromycin (BIAXIN) 250 MG tablet Take one tablet po BID. Take with glass of water and snack 03/01/21   Luking, Scott A, MD  diphenhydrAMINE-APAP, sleep, (GOODY PM PO) Take 1 Dose by mouth daily as needed (sleep).    [provider]  Melatonin ([employer] MELATONIN) 3 MG TABS Take 3 mg by mouth daily.    [provider]  metroNIDAZOLE (METROCREAM) 0.75 % cream Apply topically 2 (two) times daily. To facial rash prn 08/22/20   Nilda Simmer, NP  minocycline (MINOCIN) 100 MG capsule Take 1 capsule (100 mg total) by mouth 2 (two) times daily. 02/28/21   Nilda Simmer, NP  omeprazole (PRILOSEC) 40 MG capsule TAKE 1 CAPSULE BY MOUTH ONCE DAILY AS NEEDED FOR ACID REFLUX, 07/06/21   Kathyrn Drown, MD  omeprazole  (PRILOSEC) 40 MG capsule Take 1 capsule (40 mg total) by mouth daily. 07/06/21   Kathyrn Drown, MD  ondansetron (ZOFRAN ODT) 4 MG disintegrating tablet Take one tablet po TID prn nausea 07/06/21   Kathyrn Drown, MD  promethazine (PHENERGAN) 25 MG tablet Take 1 tablet (25 mg total) by mouth every 6 (six) hours as needed for nausea or vomiting. 12/12/19   Idol, Almyra Free, PA-C  SUBOXONE 8-2 MG FILM Place 2 Film under the tongue 2 (two) times daily. 04/22/18   [provider]  triamcinolone (KENALOG) 0.1 % Apply 1 application topically 2 (two) times daily. Prn rash; use up to 2 weeks 02/23/21   Nilda Simmer, NP    Allergies    Vicoprofen [hydrocodone-ibuprofen], Doxycycline, and Morphine and related  Review of Systems   Review of Systems  Constitutional:  Negative for chills and fever.  HENT:  Negative for congestion.   Respiratory:  Negative for shortness of breath.   Cardiovascular:  Negative for chest pain.  Gastrointestinal:  Positive for abdominal pain, diarrhea and vomiting. Negative for nausea.  Genitourinary:  Negative for enuresis.  Musculoskeletal:  Negative for back pain.  Skin:  Negative for rash.  Neurological:  Negative for headaches.  Hematological:  Does not bruise/bleed easily.   Physical Exam Updated Vital Signs BP (!) 130/97   Pulse 89   Temp 98.5 F (36.9 C) (Oral)   Resp 12   Ht $R'5\' 4"'wO$  (1.626 m)   Wt 90.7 kg   LMP 09/08/2021 (Exact Date)   SpO2 100%   BMI 34.33 kg/m   Physical Exam Vitals and nursing note reviewed.  Constitutional:      General: She is not in acute distress.    Appearance: She is not ill-appearing.  HENT:     Head: Normocephalic and atraumatic.     Nose: No congestion.  Eyes:     Conjunctiva/sclera: Conjunctivae normal.  Cardiovascular:     Rate and Rhythm: Normal rate and regular rhythm.     Pulses: Normal pulses.     Heart sounds: No murmur heard.   No friction rub. No gallop.  Pulmonary:     Effort: No respiratory  distress.     Breath sounds: No wheezing, rhonchi or rales.  Abdominal:     General: There is distension.     Palpations: Abdomen is soft.     Tenderness: There is abdominal tenderness. There is no right CVA tenderness or left CVA tenderness.     Comments: Abdomen visualized slightly distended, normal bowel sounds, dull to percussion,  she has slight tenderness to palpation in her upper abdomen, no guarding, rebound tenderness, peritoneal sign, negative Murphy sign McBurney point.  No CVA tenderness.  Musculoskeletal:     Right lower leg: No edema.     Left lower leg: No edema.  Skin:    General: Skin is warm and dry.  Neurological:     Mental Status: She is alert.  Psychiatric:        Mood and Affect: Mood normal.    ED Results / Procedures / Treatments   Labs (all labs ordered are listed, but only abnormal results are displayed) Labs Reviewed  COMPREHENSIVE METABOLIC PANEL - Abnormal; Notable for the following components:      Result Value   Sodium 133 (*)    Potassium 3.0 (*)    Chloride 92 (*)    Glucose, Bld 138 (*)    Calcium 7.7 (*)    Total Protein 8.2 (*)    Albumin 3.3 (*)    AST 73 (*)    Alkaline Phosphatase 184 (*)    All other components within normal limits  URINALYSIS, ROUTINE W REFLEX MICROSCOPIC - Abnormal; Notable for the following components:   Color, Urine AMBER (*)    APPearance HAZY (*)    Hgb urine dipstick SMALL (*)    Bilirubin Urine SMALL (*)    Ketones, ur 5 (*)    Protein, ur 30 (*)    Bacteria, UA RARE (*)    All other components within normal limits  LIPASE, BLOOD  CBC  POC URINE PREG, ED    EKG None  Radiology CT ABDOMEN PELVIS W CONTRAST  Result Date: 09/10/2021 CLINICAL DATA:  Evaluate for bowel obstruction. Bloating and abdominal distension for 2 weeks. EXAM: CT ABDOMEN AND PELVIS WITH CONTRAST TECHNIQUE: Multidetector CT imaging of the abdomen and pelvis was performed using the standard protocol following bolus administration of  intravenous contrast. CONTRAST:  38mL OMNIPAQUE IOHEXOL 350 MG/ML SOLN COMPARISON:  12/12/2019 FINDINGS: Lower chest: No acute abnormality. Hepatobiliary: Marked diffuse hepatic steatosis. No focal liver abnormality. Gallbladder is unremarkable. Pancreas: There is a fluid attenuating structure within the body and tail of pancreas which measures 2.4 x 1.6 cm, image 52/5. No main duct dilatation or inflammation identified Spleen: Normal in size without focal abnormality. Adrenals/Urinary Tract: Normal adrenal glands. No kidney mass or hydronephrosis. The urinary bladder is unremarkable. Stomach/Bowel: Stomach is within normal limits. Appendix appears normal. No evidence of bowel wall thickening, distention, or inflammatory changes. Vascular/Lymphatic: No aneurysm. No abdominopelvic adenopathy. Left upper quadrant varices are identified likely due to chronic occlusion of the splenic vein secondary to pancreatitis. Reproductive: Cyst in the right ovary measures 2.9 cm, image 73/2. 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 Left ovary is unremarkable. Uterus appears normal. Other: No free fluid or fluid collections. Musculoskeletal: No acute or significant osseous findings. IMPRESSION: 1. No acute findings identified within the abdomen or pelvis. No evidence for bowel obstruction. 2. Marked hepatic steatosis. 3. 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 4. Cystic structure within the body and tail of pancreas is identified measuring 2.4 x 1.6 cm. In a patient who has a history of pancreatitis this is favored to represent a pseudocyst. Follow-up in 6 months with pancreas protocol MRI without and with contrast material is advised. This recommendation follows ACR consensus guidelines: Management of Incidental Pancreatic Cysts: A White Paper of the ACR Incidental Findings Committee. J Am Coll Radiol 7408;14:481-856. 5. Right ovary cyst measures 2.9 cm.  No follow-up imaging recommended. Note:  This recommendation does not apply to premenarchal patients and to those with increased risk (genetic, family history, elevated tumor markers or other high-risk factors) of ovarian cancer. Reference: JACR 2020 Feb; 17(2):248-254. This recommendation follows ACR consensus guidelines: White Paper of the ACR Incidental Findings Committee II on Vascular Findings. J Am Coll Radiol 2013; 10:789-794. 6. Aortic Atherosclerosis (ICD10-I70.0). Electronically Signed   By: Kerby Moors M.D.   On: 09/10/2021 12:34    Procedures Procedures   Medications Ordered in ED Medications  dicyclomine (BENTYL) injection 20 mg (20 mg Intramuscular Given 09/10/21 1211)  fentaNYL (SUBLIMAZE) injection 100 mcg (100 mcg Intravenous Given 09/10/21 1215)  iohexol (OMNIPAQUE) 350 MG/ML injection 80 mL (80 mLs Intravenous Contrast Given 09/10/21 1155)    ED Course  I have reviewed the triage vital signs and the nursing notes.  Pertinent labs & imaging results that were available during my care of the patient were reviewed by me and considered in my medical decision making (see chart for details).    MDM Rules/Calculators/A&P                          Initial impression-patient presents with abdominal bloating this.  She is alert, does not appear in acute stress, vital signs reassuring.  Unclear etiology, will obtain basic lab work-up, provide patient with pain medications, order CT imaging and reassess.  Work-up-CBC unremarkable, CMP shows slight hyponatremia of 133, hypokalemia 3.0, hyperglycemia 138, decrease calcium 7.7, UA shows negative nitrites leukocytes, shows few red blood cells rare bacteria.  Lipase is 22, urine pregnancy is negative, CT abdomen pelvis reveals no acute abnormalities, does show marked hepatic steatosis, the cystic structure within the tail of the pancreas likely a pseudocyst, recommend follow-up next 3 months.  Right ovarian measuring 2.9 no follow-up is necessary.  Reassessment-patient was  reassessed after pain medications, states she is feeling better, has no complaints at this time.  Updated on lab work and imaging patient is in agreement with DC  Rule out-I have low suspicion for bowel obstruction, pancreatitis, complicated diverticulitis, intra-abdominal infection, UTI, [place], kidney stone as CT imaging is all negative for acute findings.  I have low suspicion for gallbladder abnormality as she has no significant increase and liver enzymes, alk phos, T bili, she is also not tender in her right upper quadrant.  Low suspicion for dissection and/or AAA as presentation is atypical for etiology no acute findings seen on CT abdomen pelvis.  I have low suspicion for pneumonia or URI as she denies URI-like symptoms, lung sounds are clear bilaterally.  Plan-  Upper abdominal bloating-likely this is secondary due to enlarged liver possible from alcohol use.  We will have her follow-up with GI for further evaluation. 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  Vital signs have remained stable, no indication for hospital admission.   Patient given at home care as well strict return precautions.  Patient verbalized that they understood agreed to said plan.  Final Clinical Impression(s) / ED Diagnoses Final diagnoses:  Left upper quadrant abdominal pain    Rx / DC Orders ED Discharge Orders          Ordered    ondansetron (ZOFRAN) 4 MG tablet  Every 6 hours        09/10/21 1319  Marcello Fennel, PA-C 09/10/21 1321    Truddie Hidden, MD 09/10/21 [id number]

## 2021-09-10 NOTE — Discharge Instructions (Signed)
Lab work was reassuring, imaging reveals that you have a pseudocyst in the tail of your pancreas you must follow-up for repeat scan after 6 months time MRI with and without contrast.  Recommend follow-up with GI for further evaluation of this.  I like you to follow-up with GI for further evaluation of the upper abdominal pressure likely from your liver.  Come back to the emergency department if you develop chest pain, shortness of breath, severe abdominal pain, uncontrolled nausea, vomiting, diarrhea.

## 2021-09-10 NOTE — ED Notes (Signed)
Pt also says has has intermittent numbness in her feet, legs, and lips.

## 2021-09-10 NOTE — ED Triage Notes (Signed)
Pt reports abd distention and bloating x 2 weeks.  Says initially was coming and going but recently hasn't been getting any better.  Reports has had diarrhea the past few mornings and has been vomiting after eating.

## 2021-09-11 ENCOUNTER — Encounter: Payer: Self-pay | Admitting: Internal Medicine

## 2021-10-23 ENCOUNTER — Telehealth: Payer: Self-pay | Admitting: Family Medicine

## 2021-10-23 ENCOUNTER — Ambulatory Visit: Payer: Self-pay | Admitting: Gastroenterology

## 2021-10-23 ENCOUNTER — Encounter: Payer: Self-pay | Admitting: Gastroenterology

## 2021-10-23 VITALS — Ht 64.0 in | Wt 182.6 lb

## 2021-10-23 DIAGNOSIS — K860 Alcohol-induced chronic pancreatitis: Secondary | ICD-10-CM

## 2021-10-23 DIAGNOSIS — R1010 Upper abdominal pain, unspecified: Secondary | ICD-10-CM

## 2021-10-23 DIAGNOSIS — R112 Nausea with vomiting, unspecified: Secondary | ICD-10-CM

## 2021-10-23 DIAGNOSIS — R140 Abdominal distension (gaseous): Secondary | ICD-10-CM

## 2021-10-23 DIAGNOSIS — R634 Abnormal weight loss: Secondary | ICD-10-CM

## 2021-10-23 DIAGNOSIS — K700 Alcoholic fatty liver: Secondary | ICD-10-CM

## 2021-10-23 DIAGNOSIS — I82890 Acute embolism and thrombosis of other specified veins: Secondary | ICD-10-CM

## 2021-10-23 MED ORDER — ONDANSETRON HCL 4 MG PO TABS: 4.0000 mg | ORAL_TABLET | Freq: Three times a day (TID) | ORAL | 1 refills | Status: DC | PRN

## 2021-10-23 NOTE — Progress Notes (Signed)
Brittany [name]:  History: Brittany [name] 10/23/2021  Referring provider: Kathyrn Drown, MD  Reason for consult/chief complaint: Bloated, Enlarged Liver, Emesis, and Abdominal Pain   Subjective  HPI:  This is a 45 year old woman accompanied by her mother and referred to [place] after recent emergency department evaluation for multiple digestive issues and CT findings.  She has been irregular and heavy alcohol user for years as indicated by notes below.  She came to the ED recently with generalized abdominal pain, bloating, nausea and early satiety.  She is also concerned about back pain, leg pain, numbness and tingling in hands and feet.  Bowel habits are regular and of normal character without bleeding. She tells me she stopped alcohol after the recent ED visit.  She believes she is down about 30 pounds over the last year or so. ______________ From ED provider [name] 09/10/2021: "Patient with significant medical history of alcohol abuse, bipolar, hypertension, pancreatitis presents  emergency department with chief complaint of stomach bloating x2 weeks.  Patient states she feels stomach bloating mainly in her upper abdomen, pain does not radiate, describes it as a pressure-like sensation, she has associated nausea, vomiting, diarrhea, she denies hematemesis, coffee-ground emesis, hematuria melena.  She has no urinary symptoms.  Patient has no significant abdominal history, states she has had pancreatitis in the past, has not had it since, she denies recent alcohol use, NSAID use, history of bowel obstructions, PUD, diverticulitis.  She states p.o. intake does not aggravate or relieve the pain.  She denies  alleviating factors.  Does not endorse headaches, fevers, chills, shortness of breath, chest pain, worsening pedal edema. "  May 2019 hospitalization for alcohol-related pancreatitis December 2020 ED visit for same, not admitted.  ROS:  Review of Systems   Constitutional:  Positive for appetite change and unexpected weight change.  HENT:  Negative for mouth sores and voice change.   Eyes:  Negative for pain and redness.  Respiratory:  Negative for cough and shortness of breath.   Cardiovascular:  Negative for chest pain and palpitations.  Genitourinary:  Negative for dysuria and hematuria.  Musculoskeletal:  Negative for arthralgias and myalgias.  Skin:  Negative for pallor and rash.  Neurological:  Positive for numbness. Negative for weakness and headaches.  Hematological:  Negative for adenopathy.    Past Medical History: Past Medical History:  Diagnosis Date   Acne rosacea    Alcohol abuse    Bipolar 1 disorder ([place])    starting to hear voices   GERD (gastroesophageal reflux disease)    Hypertension    MVP (mitral valve prolapse)    Pancreatitis    Seizures ([place]) 2009   stress induced     Past Surgical History: Past Surgical History:  Procedure Laterality Date   LAPAROSCOPIC TUBAL LIGATION  11/04/2012   Procedure: LAPAROSCOPIC TUBAL LIGATION;  Surgeon: Florian Buff, MD;  Location: [facility];  Service: Gynecology;  Laterality: Bilateral;   NASAL SINUS SURGERY       Family History: Family History  Problem Relation Age of Onset   Hypertension Mother     Social History: Social History   Socioeconomic History   Marital status: Single    Spouse name: Not on file   Number of children: Not on file   Years of education: Not on file   Highest education level: Not on file  Occupational History   Not on file  Tobacco Use   Smoking status: Former  Years: 10.00    Types: Cigarettes    Quit date: 08/22/2017    Years since quitting: 4.1   Smokeless tobacco: Never  Vaping Use   Vaping Use: Former   Quit date: 06/21/2020  Substance and Sexual Activity   Alcohol use: Yes    Comment: 1-3 drinks per week   Drug use: No   Sexual activity: Yes    Birth control/protection: Surgical  Other Topics Concern   Not on file   Social History Narrative   Not on file   Social Determinants of Health   Financial Resource Strain: Not on file  Food Insecurity: Not on file  Transportation Needs: Not on file  Physical Activity: Not on file  Stress: Not on file  Social Connections: Not on file    Allergies: Allergies  Allergen Reactions   Vicoprofen [Hydrocodone-Ibuprofen] Nausea Only   Doxycycline Nausea And Vomiting   Morphine And Related Itching    Outpatient Meds: Current Outpatient Medications  Medication Sig Dispense Refill   acetaminophen (TYLENOL) 500 MG tablet Take 500 mg by mouth every 6 (six) hours as needed for moderate pain.     omeprazole (PRILOSEC) 40 MG capsule Take 40 mg by mouth daily.     ondansetron (ZOFRAN ODT) 4 MG disintegrating tablet Take one tablet po TID prn nausea 15 tablet 3   SUBOXONE 8-2 MG FILM Place 2 Film under the tongue 2 (two) times daily.  0   No current facility-administered medications for this visit.    Of [name], and as indicated in provider notes elsewhere, Brittany [name] was once in the Suboxone program but has not been for years, and still has some leftover tablets that she occasionally takes for pain.  ___________________________________________________________________ Objective   Exam:  Ht $R'5\' 4"'NW$  (1.626 m)   Wt 182 lb 9.6 oz (82.8 kg)   BMI 31.34 kg/m  Wt Readings from Last 3 Encounters:  10/23/21 182 lb 9.6 oz (82.8 kg)  09/10/21 200 lb (90.7 kg)  02/23/21 208 lb 12.8 oz (94.7 kg)  ([name] weight down 26 pounds since March of this year)  General: Chronically ill-appearing, anxious.  Not acutely ill-appearing.  Mother present for entire visit. Eyes: sclera anicteric, no redness ENT: oral mucosa moist without lesions, no cervical or supraclavicular lymphadenopathy CV: RRR without murmur, S1/S2, no JVD, trace bilateral pretibial edema Resp: clear to auscultation bilaterally, normal RR and effort noted GI: soft, mild bandlike upper tenderness, with active bowel  sounds. No guarding or palpable organomegaly noted. Skin; severe rosacea or acne.  No palmar erythema or spider nevi Neuro: awake, alert and oriented x 3. Normal gross motor function and fluent speech Hands mildly tremulous at rest, no asterixis or slurred speech. Gets up slowly, able to get on exam table without assistance.  Labs:  CBC Latest Ref Rng & Units 09/10/2021 12/12/2019 05/07/2018  WBC 4.0 - 10.5 K/uL 5.9 9.6 9.9  Hemoglobin 12.0 - 15.0 g/dL 13.7 12.8 13.1  Hematocrit 36.0 - 46.0 % 43.7 42.5 42.3  Platelets 150 - 400 K/uL 323 401(H) 340   BMP Latest Ref Rng & Units 09/10/2021 12/12/2019 05/09/2018  Glucose 70 - 99 mg/dL 138(H) 120(H) 99  BUN 6 - 20 mg/dL 6 6 <5(L)  Creatinine 0.44 - 1.00 mg/dL 0.61 0.54 0.41(L)  Sodium 135 - 145 mmol/L 133(L) 135 135  Potassium 3.5 - 5.1 mmol/L 3.0(L) 3.5 3.1(L)  Chloride 98 - 111 mmol/L 92(L) 97(L) 101  CO2 22 - 32 mmol/L $RemoveB'29 26 26  'gJKDCKeT$ Calcium  8.9 - 10.3 mg/dL 7.7(L) 9.1 8.4(L)   Hepatic Function Latest Ref Rng & Units 09/10/2021 12/12/2019 05/07/2018  Total Protein 6.5 - 8.1 g/dL 8.2(H) 8.5(H) 9.0(H)  Albumin 3.5 - 5.0 g/dL 3.3(L) 4.5 4.3  AST 15 - 41 U/L 73(H) 24 23  ALT 0 - 44 U/L 32 19 14  Alk Phosphatase 38 - 126 U/L 184(H) 90 97  Total Bilirubin 0.3 - 1.2 mg/dL 1.0 0.7 0.9    Radiologic Studies:  CLINICAL DATA:  Evaluate for bowel obstruction. Bloating and abdominal distension for 2 weeks.   EXAM: CT ABDOMEN AND PELVIS WITH CONTRAST   TECHNIQUE: Multidetector CT imaging of the abdomen and pelvis was performed using the standard protocol following bolus administration of intravenous contrast.   CONTRAST:  55mL OMNIPAQUE IOHEXOL 350 MG/ML SOLN   COMPARISON:  12/12/2019   FINDINGS: Lower chest: No acute abnormality.   Hepatobiliary: Marked diffuse hepatic steatosis. No focal liver abnormality. Gallbladder is unremarkable.   Pancreas: There is a fluid attenuating structure within the body and tail of pancreas which  measures 2.4 x 1.6 cm, image 52/5. No main duct dilatation or inflammation identified   Spleen: Normal in size without focal abnormality.   Adrenals/Urinary Tract: Normal adrenal glands. No kidney mass or hydronephrosis. The urinary bladder is unremarkable.   Stomach/Bowel: Stomach is within normal limits. Appendix appears normal. No evidence of bowel wall thickening, distention, or inflammatory changes.   Vascular/Lymphatic: No aneurysm. No abdominopelvic adenopathy. Left upper quadrant varices are identified likely due to chronic occlusion of the splenic vein secondary to pancreatitis.   Reproductive: Cyst in the right ovary measures 2.9 cm, image 73/2. 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 Left ovary is unremarkable. Uterus appears normal.   Other: No free fluid or fluid collections.   Musculoskeletal: No acute or significant osseous findings.   IMPRESSION: 1. No acute findings identified within the abdomen or pelvis. No evidence for bowel obstruction. 2. Marked hepatic steatosis. 3. 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 4. Cystic structure within the body and tail of pancreas is identified measuring 2.4 x 1.6 cm. In a patient who has a history of pancreatitis this is favored to represent a pseudocyst. Follow-up in 6 months with pancreas protocol MRI without and with contrast material is advised. This recommendation follows ACR consensus guidelines: Management of Incidental Pancreatic Cysts: A White Paper of the ACR Incidental Findings Committee. J Am Coll Radiol 9528;41:324-401. 5. Right ovary cyst measures 2.9 cm. No follow-up imaging recommended. [name]: This recommendation does not apply to premenarchal patients and to those with increased risk (genetic, family history, elevated tumor markers or other high-risk factors) of ovarian cancer. Reference: JACR 2020 Feb; 17(2):248-254. This recommendation follows ACR consensus  guidelines: White Paper of the ACR Incidental Findings Committee II on Vascular Findings. J Am Coll Radiol 2013; 10:789-794. 6. Aortic Atherosclerosis (ICD10-I70.0).     Electronically Signed   By: Kerby Moors M.D.   On: 09/10/2021 12:34   Assessment: Encounter Diagnoses  Name Primary?   Upper abdominal pain Yes   Alcohol-induced chronic pancreatitis (HCC)    Splenic vein thrombosis    Nausea and vomiting in adult    Abnormal loss of weight    Fatty liver, alcoholic    Abdominal bloating     Chronic abdominal pain and upper digestive symptoms in the setting of longstanding alcohol abuse and history of recurrent pancreatitis.  Recent CT scan reveals alcohol-related fatty liver and chronic splenic vein thrombosis with  upper abdominal collaterals.  We had a long discussion about the serious nature of all of these findings.  I am particularly concerned about her weight loss.  There is no description of masslike finding in the pancreas on this contrast-enhanced CT scan, though that may need further investigation in the setting of splenic vein thrombosis.  This thrombosis is chronic and not amenable to [facility] or intervention.  The concern is if it may have caused esophageal or gastric varices as well.  Plan: She unquestionably needs to remain completely abstinent from alcohol.  She was drinking alcohol up to the time of recent ED visit by her own admission and looking at her LFTs.  I hope she is sincere about recent alcohol cessation, as I have made it clear in no uncertain terms that her life depends on it. Regarding her neurologic symptoms, wonder if this may be an alcohol-related neuropathy, and I am copying my [name] to primary care so they can make arrangements to see her.  I am not sure they are aware of these recent ED findings as well.  Her upper digestive symptoms need further evaluation.  I recommended upper endoscopy, which she is agreeable to but somewhat apprehensive related to  the cost and her current lack of insurance.  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  She ultimately decided to schedule the procedure and was given the required financial disclosures.  We will also give her information on one [employer] services in case there is any consideration available for her in that regard.  As needed ondansetron  Thank you for the courtesy of this consult.  Please call me with any questions or concerns.  Nelida Meuse III  CC: Referring provider noted above

## 2021-10-23 NOTE — Patient Instructions (Signed)
If you are age 44 or older, your body mass index should be between 23-30. Your Body mass index is 31.34 kg/m. If this is out of the aforementioned range listed, please consider follow up with your Primary Care Provider.  If you are age 62 or younger, your body mass index should be between 19-25. Your Body mass index is 31.34 kg/m. If this is out of the aformentioned range listed, please consider follow up with your Primary Care Provider.   ________________________________________________________  The [facility] providers would like to encourage you to use [facility] to communicate with providers for non-urgent requests or questions.  Due to long hold times on the telephone, sending your provider a message by [facility] may be a faster and more efficient way to get a response.  Please allow 48 business hours for a response.  Please remember that this is for non-urgent requests.  _______________________________________________________  Bonita Quin have been scheduled for an endoscopy. Please follow written instructions given to you at your visit today. If you use inhalers (even only as needed), please bring them with you on the day of your procedure.  It was a pleasure to see you today!  Thank you for trusting me with your gastrointestinal care!

## 2021-10-23 NOTE — Telephone Encounter (Signed)
May see Eber Jones on Friday if pt would like

## 2021-10-23 NOTE — Telephone Encounter (Signed)
Patient has been having numbness in legs and fingers for 3 weeks now. She is wanting appointment for this week if possible explain to patient only provider here would send back message. Please advise

## 2021-10-26 ENCOUNTER — Other Ambulatory Visit: Payer: Self-pay

## 2021-10-26 ENCOUNTER — Ambulatory Visit (INDEPENDENT_AMBULATORY_CARE_PROVIDER_SITE_OTHER): Payer: Self-pay | Admitting: Nurse Practitioner

## 2021-10-26 VITALS — BP 126/82 | Ht 63.0 in | Wt 182.4 lb

## 2021-10-26 DIAGNOSIS — E871 Hypo-osmolality and hyponatremia: Secondary | ICD-10-CM

## 2021-10-26 DIAGNOSIS — E876 Hypokalemia: Secondary | ICD-10-CM

## 2021-10-26 DIAGNOSIS — G6289 Other specified polyneuropathies: Secondary | ICD-10-CM

## 2021-10-26 NOTE — Progress Notes (Signed)
Subjective:    Patient ID: Brittany [name], female    DOB: 12/17/1976, 45 y.o.   MRN: [id number]  HPI Patient presents with complaints of numbness/tingling in hands, legs and feet that started 7-month ago and lasts all day long. Describes as a "pin prick" sensation. The patient has not tried anything to alleviate symptoms. She is experiencing fatigue and could not work this week. She is being managed by GI for a blood clot in the spleen and an inflamed pancreas. She had blood work done one month ago. She can only eat one meal daily due to her GI issues and is scheduled for an endoscopy November 29th of this month. Denies diarrhea and constipation but does have severe upper epigastric pain. Denies chest pain, SOB. She is not sexually active.  Seen in ED with labs on 09/10/21.   Review of Systems  Constitutional:  Positive for fatigue. Negative for chills and fever.  HENT:  Negative for sore throat and trouble swallowing.   Eyes:  Negative for visual disturbance.  Respiratory:  Negative for cough, chest tightness, shortness of breath and wheezing.   Cardiovascular:  Negative for chest pain.  Gastrointestinal:  Positive for abdominal pain and vomiting. Negative for blood in stool, constipation, diarrhea and nausea.       Upper gastric abd pain and vomits if eats too much due to current GI issues.   Neurological:  Positive for weakness, light-headedness and numbness. Negative for dizziness and syncope.       Numness/tingling in hands, legs and feet bilaterally. Says she has rare weakness in her knees that has caused her to fall twice.  Mild lightheadness but no syncope.       Objective:   Physical Exam Vitals and nursing note reviewed.  Constitutional:      General: She is not in acute distress.    Appearance: Normal appearance.  Cardiovascular:     Rate and Rhythm: Normal rate and regular rhythm.     Heart sounds: Normal heart sounds. No murmur heard. Pulmonary:     Effort: Pulmonary  effort is normal.     Breath sounds: Normal breath sounds.  Abdominal:     General: There is no distension.     Palpations: Abdomen is soft.     Tenderness: There is abdominal tenderness. There is guarding.     Comments: Distinct epigastric area tenderness and guarding with palpation.   Musculoskeletal:     Cervical back: Neck supple.  Lymphadenopathy:     Cervical: No cervical adenopathy.  Skin:    General: Skin is warm and dry.  Neurological:     Mental Status: She is alert and oriented to person, place, and time.     Motor: No weakness.     Coordination: Coordination normal.     Gait: Gait normal.     Comments: Muscle strength normal bilateral lower extremities. Gets on and off exam table without difficulty. Romberg neg.   Psychiatric:        Mood and Affect: Mood normal.        Behavior: Behavior normal.        Thought Content: Thought content normal.        Judgment: Judgment normal.     .. Today's Vitals   10/26/21 1129  BP: 126/82  Weight: 182 lb 6.4 oz (82.7 kg)  Height: 5\' 3"  (1.6 m)   Body mass index is 32.31 kg/m.  Recent Results (from the past 2160 hour(s))  Urinalysis, Routine  w reflex microscopic Urine, Clean Catch     Status: Abnormal   Collection Time: 09/10/21 10:28 AM  Result Value Ref Range   Color, Urine AMBER (A) YELLOW    Comment: BIOCHEMICALS MAY BE AFFECTED BY COLOR   APPearance HAZY (A) CLEAR   Specific Gravity, Urine 1.026 1.005 - 1.030   pH 6.0 5.0 - 8.0   Glucose, UA NEGATIVE NEGATIVE mg/dL   Hgb urine dipstick SMALL (A) NEGATIVE   Bilirubin Urine SMALL (A) NEGATIVE   Ketones, ur 5 (A) NEGATIVE mg/dL   Protein, ur 30 (A) NEGATIVE mg/dL   Nitrite NEGATIVE NEGATIVE   Leukocytes,Ua NEGATIVE NEGATIVE   RBC / HPF 6-10 0 - 5 RBC/hpf   WBC, UA 0-5 0 - 5 WBC/hpf   Bacteria, UA RARE (A) NONE SEEN   Squamous Epithelial / LPF 0-5 0 - 5   Mucus PRESENT     Comment: Performed at [facility], [address] [place], [place] [address]  Lipase,  blood     Status: None   Collection Time: 09/10/21 10:37 AM  Result Value Ref Range   Lipase 22 11 - 51 U/L    Comment: Performed at [facility], [address], [place], [place] [address]  Comprehensive metabolic panel     Status: Abnormal   Collection Time: 09/10/21 10:37 AM  Result Value Ref Range   Sodium 133 (L) 135 - 145 mmol/L   Potassium 3.0 (L) 3.5 - 5.1 mmol/L   Chloride 92 (L) 98 - 111 mmol/L   CO2 29 22 - 32 mmol/L   Glucose, Bld 138 (H) 70 - 99 mg/dL    Comment: Glucose reference range applies only to samples taken after fasting for at least 8 hours.   BUN 6 6 - 20 mg/dL   Creatinine, Ser 0.61 0.44 - 1.00 mg/dL   Calcium 7.7 (L) 8.9 - 10.3 mg/dL   Total Protein 8.2 (H) 6.5 - 8.1 g/dL   Albumin 3.3 (L) 3.5 - 5.0 g/dL   AST 73 (H) 15 - 41 U/L   ALT 32 0 - 44 U/L   Alkaline Phosphatase 184 (H) 38 - 126 U/L   Total Bilirubin 1.0 0.3 - 1.2 mg/dL   GFR, Estimated >60 >60 mL/min    Comment: (NOTE) Calculated using the CKD-EPI Creatinine Equation (2021)    Anion gap 12 5 - 15    Comment: Performed at [facility], [address] [place], [place] [address]  CBC     Status: None   Collection Time: 09/10/21 10:37 AM  Result Value Ref Range   WBC 5.9 4.0 - 10.5 K/uL   RBC 4.82 3.87 - 5.11 MIL/uL   Hemoglobin 13.7 12.0 - 15.0 g/dL   HCT 43.7 36.0 - 46.0 %   MCV 90.7 80.0 - 100.0 fL   MCH 28.4 26.0 - 34.0 pg   MCHC 31.4 30.0 - 36.0 g/dL   RDW 15.3 11.5 - 15.5 %   Platelets 323 150 - 400 K/uL   nRBC 0.0 0.0 - 0.2 %    Comment: Performed at [facility], [address], [place], [place] [address]  POC urine preg, ED     Status: None   Collection Time: 09/10/21 11:29 AM  Result Value Ref Range   Preg Test, Ur NEGATIVE NEGATIVE    Comment:        THE SENSITIVITY OF THIS METHODOLOGY IS >24 mIU/mL      Assessment & Plan:  Hypokalemia - Plan: Comprehensive  metabolic panel  Other polyneuropathy - Plan: CBC with Differential/Platelet, Vitamin B12, Ferritin  Hyponatremia  - Plan: Comprehensive metabolic panel  Hypocalcemia - Plan: Comprehensive metabolic panel   Labs pending to reassess after last labs.  Encouraged regular supplements such as Boost or Ensure with limited food intake. Follow up with GI specialist as planned. Further follow up based on labs.

## 2021-10-26 NOTE — Progress Notes (Signed)
   Subjective:    Patient ID: Brittany [name], female    DOB: [birth year], 45 y.o.   MRN: [id number]  HPI  Patient arrives to discuss numbness and tingling in fingers and legs for about a month.  Review of Systems     Objective:   Physical Exam        Assessment & Plan:

## 2021-10-27 ENCOUNTER — Encounter: Payer: Self-pay | Admitting: Nurse Practitioner

## 2021-10-27 LAB — CBC WITH DIFFERENTIAL/PLATELET
Basophils Absolute: 0.1 10*3/uL (ref 0.0–0.2)
Basos: 1 %
EOS (ABSOLUTE): 0.1 10*3/uL (ref 0.0–0.4)
Eos: 1 %
Hematocrit: 38.8 % (ref 34.0–46.6)
Hemoglobin: 12.5 g/dL (ref 11.1–15.9)
Immature Grans (Abs): 0 10*3/uL (ref 0.0–0.1)
Immature Granulocytes: 0 %
Lymphocytes Absolute: 1.6 10*3/uL (ref 0.7–3.1)
Lymphs: 24 %
MCH: 28.4 pg (ref 26.6–33.0)
MCHC: 32.2 g/dL (ref 31.5–35.7)
MCV: 88 fL (ref 79–97)
Monocytes Absolute: 0.4 10*3/uL (ref 0.1–0.9)
Monocytes: 6 %
Neutrophils Absolute: 4.6 10*3/uL (ref 1.4–7.0)
Neutrophils: 68 %
Platelets: 311 10*3/uL (ref 150–450)
RBC: 4.4 x10E6/uL (ref 3.77–5.28)
RDW: 14.2 % (ref 11.7–15.4)
WBC: 6.7 10*3/uL (ref 3.4–10.8)

## 2021-10-27 LAB — COMPREHENSIVE METABOLIC PANEL
ALT: 19 IU/L (ref 0–32)
AST: 42 IU/L — ABNORMAL HIGH (ref 0–40)
Albumin/Globulin Ratio: 0.9 — ABNORMAL LOW (ref 1.2–2.2)
Albumin: 3.4 g/dL — ABNORMAL LOW (ref 3.8–4.8)
Alkaline Phosphatase: 162 IU/L — ABNORMAL HIGH (ref 44–121)
BUN/Creatinine Ratio: 10 (ref 9–23)
BUN: 5 mg/dL — ABNORMAL LOW (ref 6–24)
Bilirubin Total: 0.8 mg/dL (ref 0.0–1.2)
CO2: 28 mmol/L (ref 20–29)
Calcium: 9.2 mg/dL (ref 8.7–10.2)
Chloride: 97 mmol/L (ref 96–106)
Creatinine, Ser: 0.52 mg/dL — ABNORMAL LOW (ref 0.57–1.00)
Globulin, Total: 3.8 g/dL (ref 1.5–4.5)
Glucose: 104 mg/dL — ABNORMAL HIGH (ref 70–99)
Potassium: 4.3 mmol/L (ref 3.5–5.2)
Sodium: 138 mmol/L (ref 134–144)
Total Protein: 7.2 g/dL (ref 6.0–8.5)
eGFR: 117 mL/min/{1.73_m2} (ref >59)

## 2021-10-27 LAB — VITAMIN B12: Vitamin B-12: 595 pg/mL (ref 232–1245)

## 2021-10-27 LAB — FERRITIN: Ferritin: 279 ng/mL — ABNORMAL HIGH (ref 15–150)

## 2021-11-01 ENCOUNTER — Encounter: Payer: Self-pay | Admitting: Nurse Practitioner

## 2021-11-01 NOTE — Telephone Encounter (Signed)
Nurses  Please see result note message from [place] to relate to the patient  Also liver enzymes look better kidney function look good B12 level looked good  If she is having ongoing troubles with the numbness neck step would be consideration for referral to neurology and nerve conduction studies if she is interested in doing so-that would be our recommendation thank you

## 2021-11-12 ENCOUNTER — Encounter: Payer: Self-pay | Admitting: Nurse Practitioner

## 2021-11-20 ENCOUNTER — Encounter: Payer: Self-pay | Admitting: Gastroenterology

## 2021-11-20 ENCOUNTER — Other Ambulatory Visit: Payer: Self-pay

## 2021-11-20 ENCOUNTER — Ambulatory Visit (AMBULATORY_SURGERY_CENTER): Payer: Self-pay | Admitting: Gastroenterology

## 2021-11-20 VITALS — BP 130/92 | HR 86 | Temp 98.0°F | Resp 14 | Ht 64.0 in | Wt 182.0 lb

## 2021-11-20 DIAGNOSIS — I864 Gastric varices: Secondary | ICD-10-CM

## 2021-11-20 DIAGNOSIS — R1010 Upper abdominal pain, unspecified: Secondary | ICD-10-CM

## 2021-11-20 DIAGNOSIS — R634 Abnormal weight loss: Secondary | ICD-10-CM

## 2021-11-20 MED ORDER — SODIUM CHLORIDE 0.9 % IV SOLN: 500.0000 mL | Freq: Once | INTRAVENOUS | Status: DC

## 2021-11-20 NOTE — Progress Notes (Signed)
Called to room to assist during endoscopic procedure.  Patient ID and intended procedure confirmed with present staff. Received instructions for my participation in the procedure from the performing physician.  

## 2021-11-20 NOTE — Progress Notes (Signed)
Pt in recovery with monitors in place, VSS. Report given to receiving RN. Bite guard was placed with pt awake to ensure comfort. No dental or soft tissue damage noted. RN will remove the guard when the pt is awake.  

## 2021-11-20 NOTE — Progress Notes (Signed)
CHECK-IN-AER  V/S-CW  Pt. Has red rash on face she stated "they say it is rosea".

## 2021-11-20 NOTE — Op Note (Signed)
[facility] Patient Name: Brittany [name] Procedure Date: 11/20/2021 9:45 AM MRN: [id number] Endoscopist: Sherilyn Cooter L. 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 No splenomegaly Medicines:                Monitored Anesthesia Care Procedure:                Pre-Anesthesia Assessment:                           - Prior to the procedure, a History and Physical                            was performed, and patient medications and                            allergies were reviewed. The patient's tolerance of                            previous anesthesia was also reviewed. The risks                            and benefits of the procedure and the sedation                            options and risks were discussed with the patient.                            All questions were answered, and informed consent                            was obtained. Prior Anticoagulants: The patient has                            taken no previous anticoagulant or antiplatelet  agents. ASA Grade Assessment: II - A patient with                            mild systemic disease. After reviewing the risks                            and benefits, the patient was deemed in                            satisfactory condition to undergo the procedure.                           After obtaining informed consent, the endoscope was                            passed under direct vision. Throughout the                             procedure, the patient's blood pressure, pulse, and                            oxygen saturations were monitored continuously. The                            Endoscope was introduced through the mouth, and                            advanced to the second part of duodenum. The upper                            GI endoscopy was accomplished without difficulty.                            The patient tolerated the procedure well. Scope In: Scope Out: Findings:                 The larynx was normal.                           The esophagus was normal. Specifically, no                            esophageal varices seen.                           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 There were no                            stigmata of recent bleeding.                           Atrophic mucosa was found in the gastric antrum.                            Biopsies were taken with a cold forceps for                            histology (r/o H pylori). Stomach distends well                            with insufflation.                           The examined duodenum was normal. Complications:            No immediate complications. Estimated Blood Loss:     Estimated blood loss was minimal. Impression:               - Normal larynx.                           - Normal esophagus.                           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                           - Gastric mucosal atrophy. Biopsied.                           - Normal examined duodenum.                           Back pressure from extensive gastric/peri-gastric                            collateral  vasculature likely causing gastric wall                            edema with symptoms or bloating and early satiety. Recommendation:           - Patient has a contact number available for                            emergencies. The signs and symptoms of potential                            delayed complications were discussed with the  patient. Return to normal activities tomorrow.                            Written discharge instructions were provided to the                            patient.                           - Resume previous diet.                           - Continue present medications.                           - Await pathology results.                           - Case will be discussed with colleagues to                            determine if any therapeutic options for extensive                            gastric varices. Not amenable to endoscopic therapy                            and BRTO may not be feasible with splenic vein                            occlusion. Evette Diclemente L. Myrtie Neither, MD 11/20/2021 10:24:17 AM This report has been signed electronically.

## 2021-11-20 NOTE — Progress Notes (Signed)
No changes to clinical history since GI office visit on 10/23/21.  The patient is appropriate for an endoscopic procedure in the ambulatory setting.

## 2021-11-20 NOTE — Patient Instructions (Signed)
Please read handouts provided. Continue present medications. Await pathology results.    YOU HAD AN ENDOSCOPIC PROCEDURE TODAY AT THE [facility]:   Refer to the procedure report that was given to you for any specific questions about what was found during the examination.  If the procedure report does not answer your questions, please call your gastroenterologist to clarify.  If you requested that your care partner not be given the details of your procedure findings, then the procedure report has been included in a sealed envelope for you to review at your convenience later.  YOU SHOULD EXPECT: Some feelings of bloating in the abdomen. Passage of more gas than usual.  Walking can help get rid of the air that was put into your GI tract during the procedure and reduce the bloating. If you had a lower endoscopy (such as a colonoscopy or flexible sigmoidoscopy) you may notice spotting of blood in your stool or on the toilet paper. If you underwent a bowel prep for your procedure, you may not have a normal bowel movement for a few days.  Please Note:  You might notice some irritation and congestion in your nose or some drainage.  This is from the oxygen used during your procedure.  There is no need for concern and it should clear up in a day or so.  SYMPTOMS TO REPORT IMMEDIATELY:    Following upper endoscopy (EGD)  Vomiting of blood or coffee ground material  New chest pain or pain under the shoulder blades  Painful or persistently difficult swallowing  New shortness of breath  Fever of 100F or higher  Black, tarry-looking stools  For urgent or emergent issues, a gastroenterologist can be reached at any hour by calling [phone]. Do not use MyChart messaging for urgent concerns.    DIET:  We do recommend a small meal at first, but then you may proceed to your regular diet.  Drink plenty of fluids but you should avoid alcoholic beverages for 24 hours.  ACTIVITY:  You  should plan to take it easy for the rest of today and you should NOT DRIVE or use heavy machinery until tomorrow (because of the sedation medicines used during the test).    FOLLOW UP: Our staff will call the number listed on your records 48-72 hours following your procedure to check on you and address any questions or concerns that you may have regarding the information given to you following your procedure. If we do not reach you, we will leave a message.  We will attempt to reach you two times.  During this call, we will ask if you have developed any symptoms of COVID 19. If you develop any symptoms (ie: fever, flu-like symptoms, shortness of breath, cough etc.) before then, please call [phone].  If you test positive for Covid 19 in the 2 weeks post procedure, please call and report this information to us.    If any biopsies were taken you will be contacted by phone or by letter within the next 1-3 weeks.  Please call us at [phone] if you have not heard about the biopsies in 3 weeks.    SIGNATURES/CONFIDENTIALITY: You and/or your care partner have signed paperwork which will be entered into your electronic medical record.  These signatures attest to the fact that that the information above on your After Visit Summary has been reviewed and is understood.  Full responsibility of the confidentiality of this discharge information lies with you and/or your care-partner. 

## 2021-11-22 ENCOUNTER — Telehealth: Payer: Self-pay | Admitting: *Deleted

## 2021-11-22 NOTE — Telephone Encounter (Signed)
yes

## 2021-11-22 NOTE — Telephone Encounter (Signed)
  Follow up Call-  Call back number 11/20/2021  Post procedure Call Back phone  # [phone]  Permission to leave phone message Yes  Some recent data might be hidden     Patient questions:  Do you have a fever, pain , or abdominal swelling? No. Pain Score  0 *  Have you tolerated food without any problems? Yes.    Have you been able to return to your normal activities? Yes.    Do you have any questions about your discharge instructions: Diet   No. Medications  Yes.   Follow up visit  No.  Do you have questions or concerns about your Care? No.  Actions: * If pain score is 4 or above: No action needed, pain <4.  Have you developed a fever since your procedure? no  2.   Have you had an respiratory symptoms (SOB or cough) since your procedure? no  3.   Have you tested positive for COVID 19 since your procedure no  4.   Have you had any family members/close contacts diagnosed with the COVID 19 since your procedure?  no   If yes to any of these questions please route to Laverna Peace, RN and Karlton Lemon, RN

## 2021-12-19 ENCOUNTER — Telehealth: Payer: Self-pay | Admitting: Gastroenterology

## 2021-12-19 NOTE — Telephone Encounter (Signed)
Lm on vm for patient to return call 

## 2021-12-19 NOTE — Telephone Encounter (Signed)
As I told Dore I would do, I have reviewed her case with one of our interventional radiologists and a surgeon about a possible approach to treating the gastric varices found on CT scan and endoscopy.  The reply as I received indicate that the only apparent option is to consider removing the spleen, which is something I would like her to see one of our surgeons to discuss.  I understand that is a big thing to consider, but I am concerned about the long-term risk of bleeding from these gastric varices.  If she is agreeable, I would like a referral sent to Dr. Sophronia Simas at [facility].  I reviewed aspects of the case with Dr. Freida Busman and she replied she would be happy to see Brittany [name] in office consult.  HD

## 2021-12-20 NOTE — Telephone Encounter (Signed)
Called and spoke with patient in regards to Dr. Myrtie Neither' recommendations. Pt would like to proceed with referral to Dr. Freida Busman at [facility]. Pt is aware that we have placed the referral and [facility] will contact her directly to set up her appt. I have given pt the number to [facility] to call if she has not heard from them in a couple of weeks. Pt verbalized understanding and had no concerns at the end of the call.  Urgent referral, records, demographic, and insurance information faxed to [facility].

## 2022-01-28 ENCOUNTER — Telehealth: Payer: Self-pay | Admitting: Gastroenterology

## 2022-01-28 NOTE — Telephone Encounter (Addendum)
Please cancel appointment for 01/29/22. This appt was made as an ER referral back in 08/2021. She has since established care with Dr. Amada Jupiter at [facility], actively seeing him.

## 2022-01-29 ENCOUNTER — Ambulatory Visit: Payer: Self-pay | Admitting: Gastroenterology

## 2022-01-29 NOTE — Telephone Encounter (Signed)
Appointment has been cancelled.

## 2022-01-30 ENCOUNTER — Telehealth: Payer: Self-pay | Admitting: Gastroenterology

## 2022-01-30 NOTE — Telephone Encounter (Signed)
Consult note received from Dr. Zenia Resides at [facility].  She is going to present this patient's case at surgical conference.  Regarding the upper digestive symptoms, I recommend a gastric emptying study to see if she may have delayed stomach emptying as a cause for her symptoms.  She still takes Suboxone sometimes, and cannot do so for 5 days before the GES, as it would affect the results.  HD

## 2022-01-30 NOTE — Telephone Encounter (Signed)
Lm on vm for patient to return call 

## 2022-02-01 NOTE — Telephone Encounter (Signed)
Lm on vm for patient to return call 

## 2022-02-05 NOTE — Telephone Encounter (Signed)
Called and spoke with patient in regards to recommendations. Pt states that she has a lot going on right now. She states that she is trying to get her daughter back in school, she got kicked out of her home, and she does not have insurance. I told her that she does not have to make a decision right now. I told her that she could think about it and give [place] a call back. Pt states that she will call us back if she wishes to proceed. Pt had no other concerns at the end of the call.

## 2022-02-06 ENCOUNTER — Other Ambulatory Visit: Payer: Self-pay

## 2022-02-06 NOTE — Progress Notes (Signed)
The proposed treatment discussed in conference is for discussion purpose only and is not a binding recommendation.  The patients have not been physically examined, or presented with their treatment options.  Therefore, final treatment plans cannot be decided.  

## 2022-02-15 ENCOUNTER — Encounter: Payer: Self-pay | Admitting: Family Medicine

## 2022-04-05 ENCOUNTER — Encounter: Payer: Self-pay | Admitting: Nurse Practitioner

## 2022-04-05 MED ORDER — OMEPRAZOLE 40 MG PO CPDR: 40.0000 mg | DELAYED_RELEASE_CAPSULE | Freq: Every day | ORAL | 0 refills | Status: DC

## 2022-06-06 IMAGING — CT CT ABD-PELV W/ CM
2 of 5 series · 16 of 46 positions shown, 18 images · IV contrast (Omnipaque or Isovue)
Comparison: 12/12/2019

CLINICAL DATA: Evaluate for bowel obstruction. Bloating and
abdominal distension for 2 weeks.

EXAM:
CT ABDOMEN AND PELVIS WITH CONTRAST
TECHNIQUE: Multidetector CT imaging of the abdomen and pelvis was performed
using the standard protocol following bolus administration of
intravenous contrast.
CONTRAST:  80mL OMNIPAQUE IOHEXOL 350 MG/ML SOLN

[Series 2: axial st · axial · 0.98mm/px · z∈[-348,+92]mm · 13 of 100 slices shown, 15 images]
[im 6/100  soft-tissue]
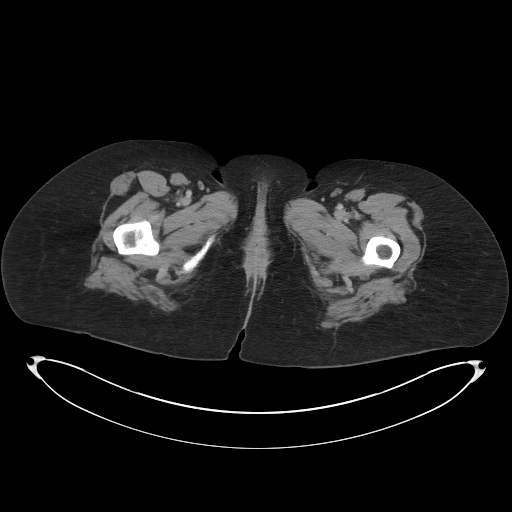
[im 6/100  bone]
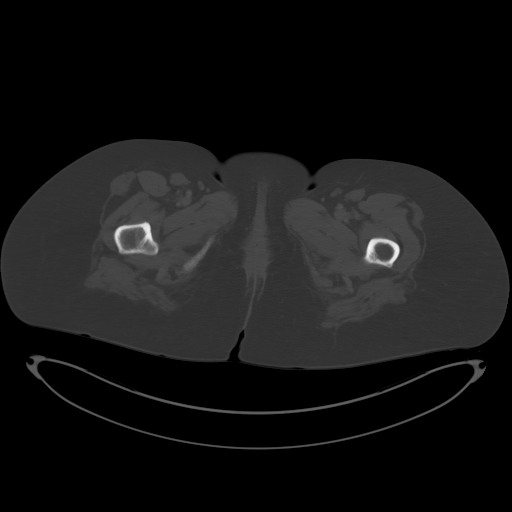
[im 16/100  soft-tissue]
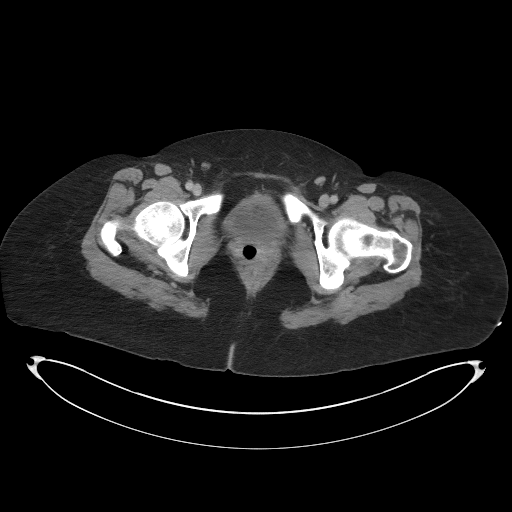
[im 21/100  soft-tissue]
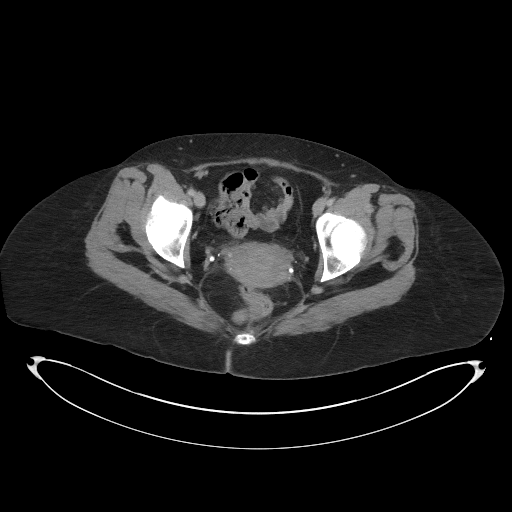
[im 27/100  soft-tissue]
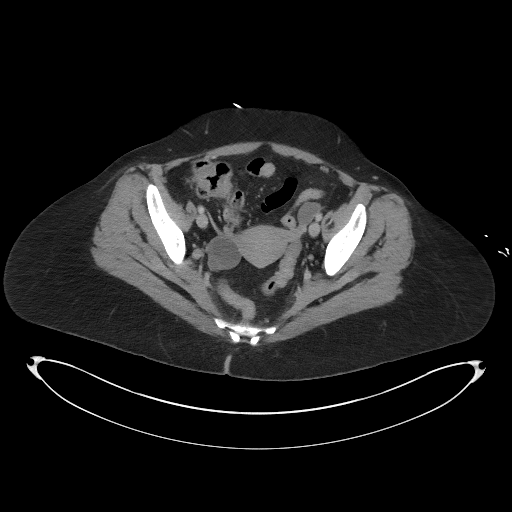
[im 37/100  soft-tissue]
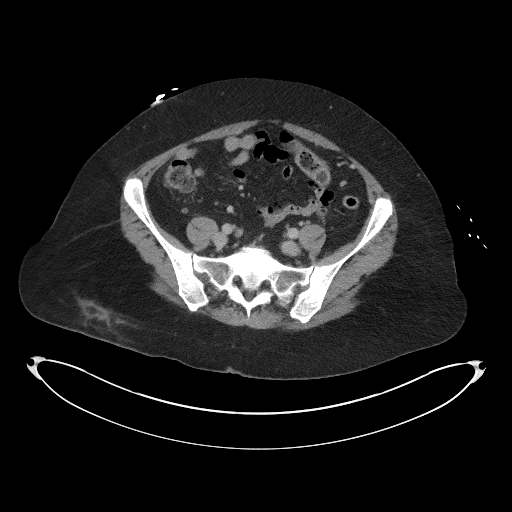
[im 42/100  soft-tissue]
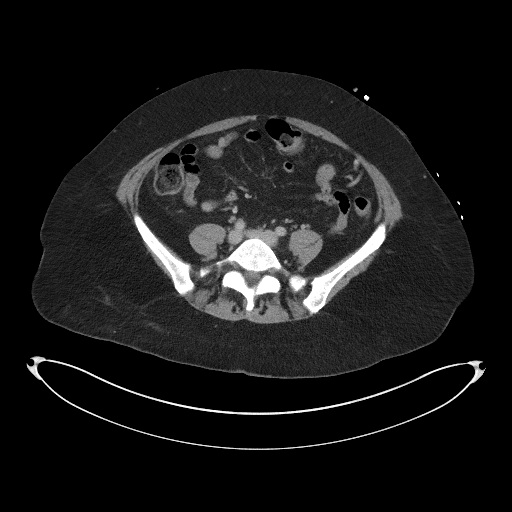
[im 53/100  soft-tissue]
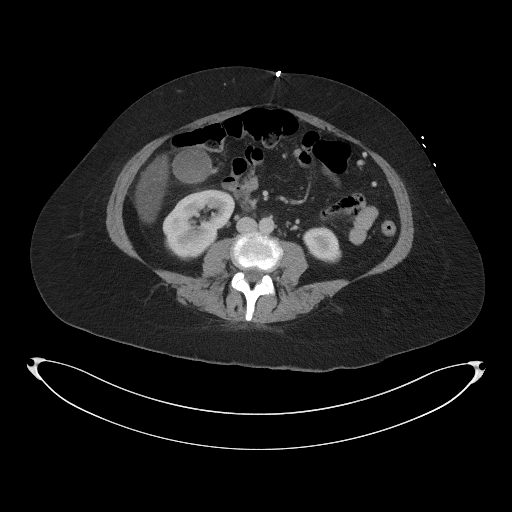
[im 58/100  soft-tissue]
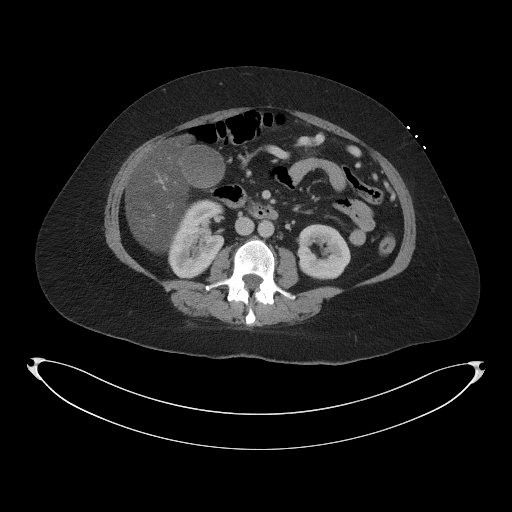
[im 63/100  soft-tissue]
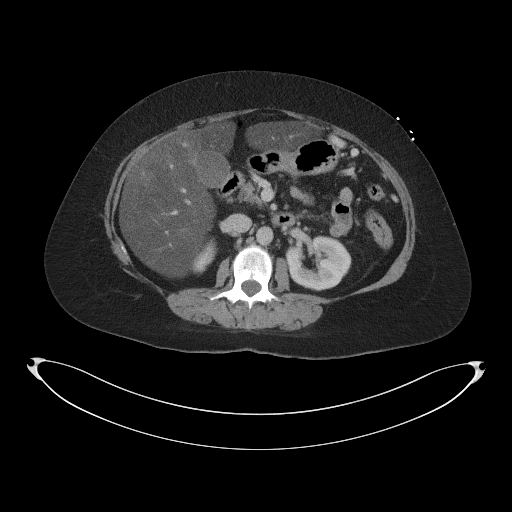
[im 63/100  bone]
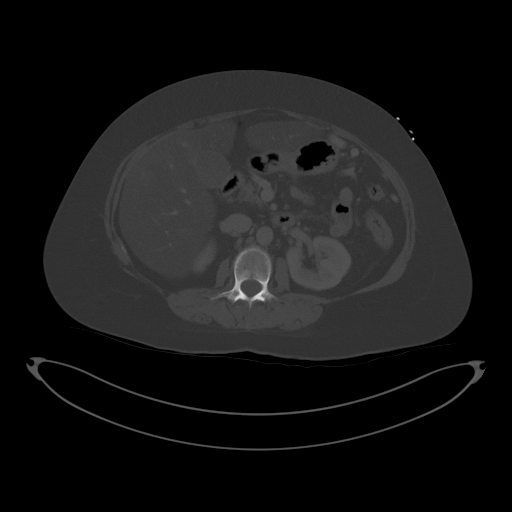
[im 73/100  soft-tissue]
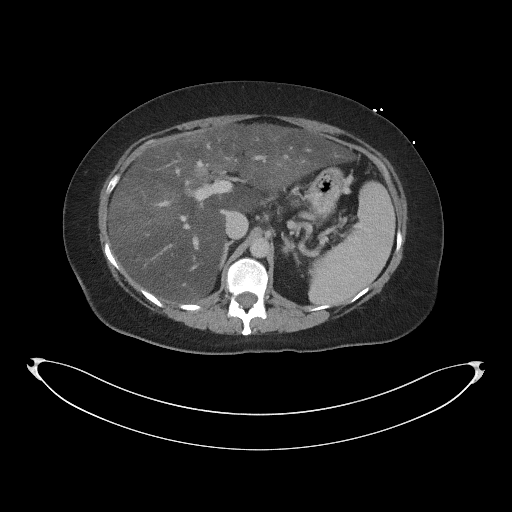
[im 79/100  soft-tissue]
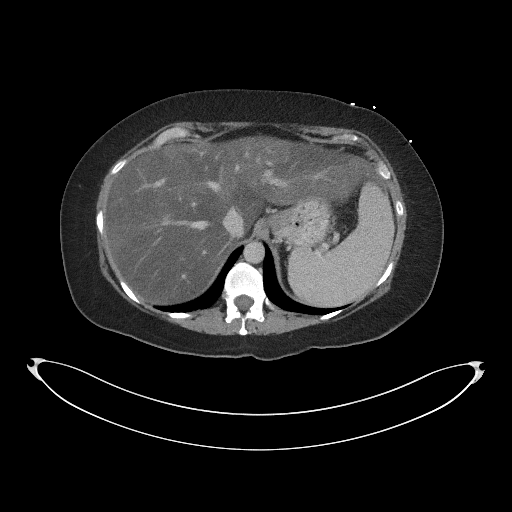
[im 84/100  soft-tissue]
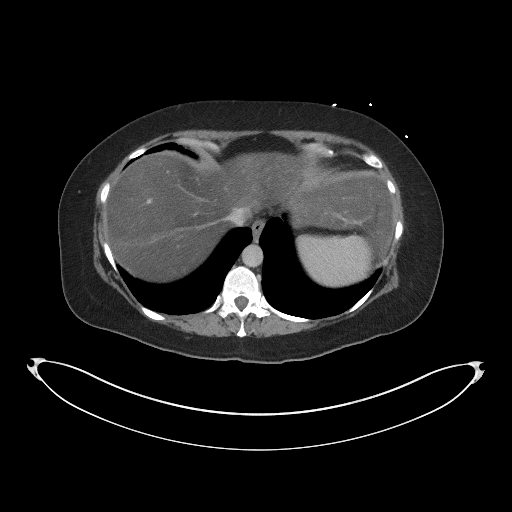
[im 94/100  soft-tissue]
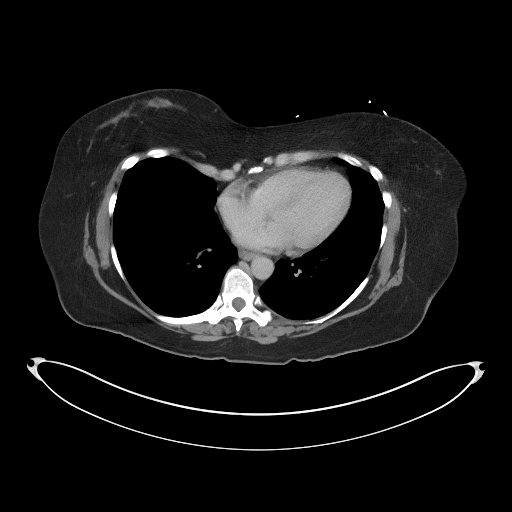

[Series 5: coronal st · coronal · 0.87mm/px · 3 of 112 slices shown]
[im 38/112  soft-tissue]
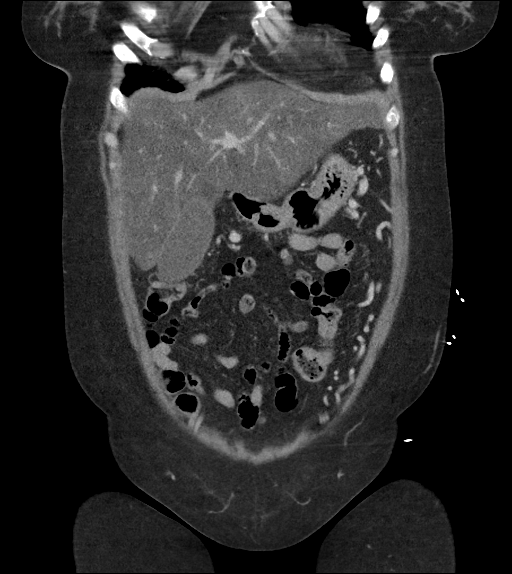
[im 50/112  soft-tissue]
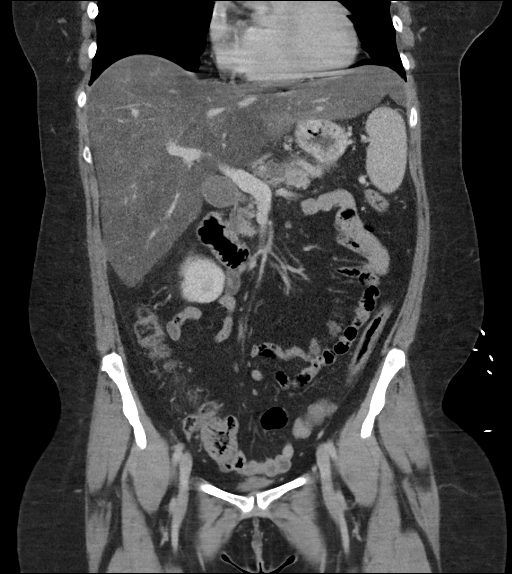
[im 62/112  soft-tissue]
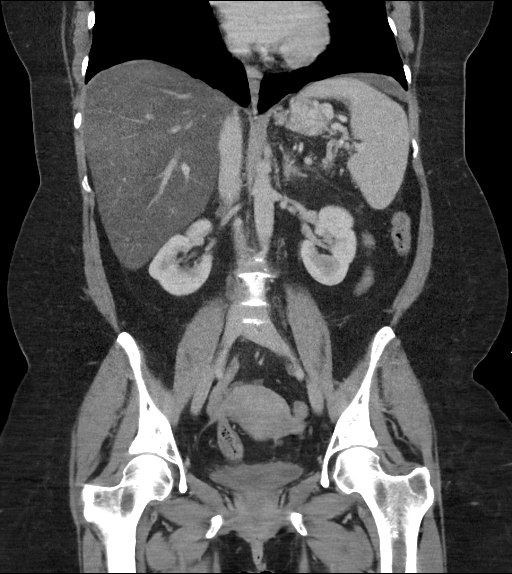

[16 of 46 positions shown; findings below may reference images not displayed]

FINDINGS: Lower chest: No acute abnormality.

Hepatobiliary: Marked diffuse hepatic steatosis. No focal liver
abnormality. Gallbladder is unremarkable.

Pancreas: There is a fluid attenuating structure within the body and
tail of pancreas which measures 2.4 x 1.6 cm, image 52/5. No main
duct dilatation or inflammation identified

Spleen: Normal in size without focal abnormality.

Adrenals/Urinary Tract: Normal adrenal glands. No kidney mass or
hydronephrosis. The urinary bladder is unremarkable.

Stomach/Bowel: Stomach is within normal limits. Appendix appears
normal. No evidence of bowel wall thickening, distention, or
inflammatory changes.

Vascular/Lymphatic: No aneurysm. No abdominopelvic adenopathy. Left
upper quadrant varices are identified likely due to chronic
occlusion of the splenic vein secondary to pancreatitis.

Reproductive: Cyst in the right ovary measures 2.9 cm, image 73/2.
Mild chronic right hydrosalpinx suspected. Left ovary is
unremarkable. Uterus appears normal.

Other: No free fluid or fluid collections.

Musculoskeletal: No acute or significant osseous findings.
IMPRESSION: 1. No acute findings identified within the abdomen or pelvis. No
evidence for bowel obstruction.
2. Marked hepatic steatosis.
3. Chronic occlusion of the splenic vein with left upper quadrant
varices.
4. Cystic structure within the body and tail of pancreas is
identified measuring 2.4 x 1.6 cm. In a patient who has a history of
pancreatitis this is favored to represent a pseudocyst. Follow-up in
6 months with pancreas protocol MRI without and with contrast
material is advised. This recommendation follows ACR consensus
guidelines: Management of Incidental Pancreatic Cysts: A White Paper
of the ACR Incidental Findings Committee. [HOSPITAL]
8464;[DATE].
5. Right ovary cyst measures 2.9 cm. No follow-up imaging
recommended. Note: This recommendation does not apply to
premenarchal patients and to those with increased risk (genetic,
family history, elevated tumor markers or other high-risk factors)
of ovarian cancer. Reference: JACR [DATE]):248-254. This
recommendation follows ACR consensus guidelines: White Paper of the
ACR Incidental Findings Committee II on Vascular Findings. [HOSPITAL] 4213; [DATE].
6. Aortic Atherosclerosis ([id number]-[id number]).

## 2022-08-10 ENCOUNTER — Other Ambulatory Visit: Payer: Self-pay | Admitting: Nurse Practitioner

## 2022-08-15 ENCOUNTER — Other Ambulatory Visit: Payer: Self-pay | Admitting: *Deleted

## 2022-08-15 MED ORDER — ONDANSETRON HCL 4 MG PO TABS: 4.0000 mg | ORAL_TABLET | Freq: Three times a day (TID) | ORAL | 1 refills | Status: DC | PRN

## 2022-09-19 ENCOUNTER — Other Ambulatory Visit: Payer: Self-pay | Admitting: Family Medicine

## 2022-10-19 ENCOUNTER — Other Ambulatory Visit: Payer: Self-pay | Admitting: Family Medicine

## 2022-10-22 ENCOUNTER — Telehealth (INDEPENDENT_AMBULATORY_CARE_PROVIDER_SITE_OTHER): Payer: Self-pay | Admitting: Nurse Practitioner

## 2022-10-22 ENCOUNTER — Encounter: Payer: Self-pay | Admitting: Nurse Practitioner

## 2022-10-22 DIAGNOSIS — U071 COVID-19: Secondary | ICD-10-CM

## 2022-10-22 MED ORDER — MOLNUPIRAVIR EUA 200MG CAPSULE: 4.0000 | ORAL_CAPSULE | Freq: Two times a day (BID) | ORAL | 0 refills | Status: AC

## 2022-10-22 NOTE — Progress Notes (Signed)
     Virtual Visit via telephone Note  I connected with Brittany [name] on 10/22/22 at 3:40pm by telephone and verified that I am speaking with the correct person using two identifiers. Brittany [name] is currently located at home. The provider, Trenton Gammon Callaway Hardigree, NP is located in their office at time of visit.  Call ended at 3:57pm  I discussed the limitations, risks, security and privacy concerns of performing an evaluation and management service by telephone and the availability of in person appointments. I also discussed with the patient that there may be a patient responsible charge related to this service. The patient expressed understanding and agreed to proceed.   History and Present Illness:  Patient calls with Covid positive results today. Patient states the illness started over the weekend. Patient states she has cough, congestion, headache and loss of smell.  Patient denies any fever, shortness of breath, or difficulty breathing  Patient interested in an antiviral.   Observations/Objective: Patient able to answer questions without difficulties. No signs of distress noted via phone call.   Assessment and Plan: 1. COVID - Due to patient health history, I believe she is a good candidate for antiviral therapy. - Decided to use Lagevrio due to less side effects and drug interactions.  - molnupiravir EUA (LAGEVRIO) 200 mg CAPS capsule; Take 4 capsules (800 mg total) by mouth 2 (two) times daily for 5 days.  Dispense: 40 capsule; Refill: 0  COVID is detected Under current CDC guidelines it is recommended to stay self isolated for at least 5 days.  If feeling well after 5 days may return to normal activities as long as you wears a mask for 5 days.  If you are not feeling well after 5 days you should stay under self-isolation for 10 days.  Warning signs to watch for if you develops chest tightness shortness of breath severe pain change in mental status you should seek further  evaluation in the ER.  If further questions or concerns please let us know.   Follow up plan: No follow-ups on file.     I discussed the assessment and treatment plan with the patient. The patient was provided an opportunity to ask questions and all were answered. The patient agreed with the plan and demonstrated an understanding of the instructions.   The patient was advised to call back or seek an in-person evaluation if the symptoms worsen or if the condition fails to improve as anticipated.  The above assessment and management plan was discussed with the patient. The patient verbalized understanding of and has agreed to the management plan. Patient is aware to call the clinic if symptoms persist or worsen. Patient is aware when to return to the clinic for a follow-up visit. Patient educated on when it is appropriate to go to the emergency department.    I provided 15 minutes of non-face-to-face time during this encounter.    Claire Shown, NP

## 2022-10-23 ENCOUNTER — Telehealth: Payer: Self-pay | Admitting: Nurse Practitioner

## 2022-10-23 ENCOUNTER — Encounter: Payer: Self-pay | Admitting: Nurse Practitioner

## 2022-10-23 NOTE — Telephone Encounter (Signed)
Please advise. Thank you

## 2022-10-23 NOTE — Telephone Encounter (Signed)
Patient is requesting prescription for paxlovid called into [facility] she states dont havemolnupiravir EUA (LAGEVRIO) 200 mg CAPS capsule [[id number]] . Also needing extended work excuse until 10/28/2022.

## 2022-10-28 ENCOUNTER — Telehealth: Payer: Self-pay | Admitting: Nurse Practitioner

## 2022-10-28 ENCOUNTER — Other Ambulatory Visit: Payer: Self-pay | Admitting: Nurse Practitioner

## 2022-10-28 DIAGNOSIS — R21 Rash and other nonspecific skin eruption: Secondary | ICD-10-CM

## 2022-10-28 MED ORDER — TRIAMCINOLONE ACETONIDE 0.1 % EX CREA: 1.0000 | TOPICAL_CREAM | Freq: Two times a day (BID) | CUTANEOUS | 0 refills | Status: DC

## 2022-10-28 MED ORDER — PREDNISONE 10 MG PO TABS
10.0000 mg | ORAL_TABLET | Freq: Every day | ORAL | 0 refills | Status: AC
Start: 2022-10-28 — End: 2022-11-04

## 2022-10-28 NOTE — Telephone Encounter (Signed)
She states still positive for Covid but now has a rash. She wanting something called into [facility]

## 2022-10-28 NOTE — Telephone Encounter (Signed)
[facility], Trenton Gammon, NP     Spoke with patient. Prescribed prednisone, zyrtec, and triamcinolone cream for patient's rash. Likely COVID rash.  Discussed the patient can test positive for COVID for extender period of time even after having COVID. Patient stated understanding.  Will call back if she needs a note for work.  Barbee Shropshire

## 2022-10-29 ENCOUNTER — Other Ambulatory Visit: Payer: Self-pay | Admitting: *Deleted

## 2022-10-29 MED ORDER — OMEPRAZOLE 40 MG PO CPDR: 40.0000 mg | DELAYED_RELEASE_CAPSULE | Freq: Every day | ORAL | 0 refills | Status: DC

## 2022-10-31 NOTE — Telephone Encounter (Signed)
Sent message to schedule appointment 10/31/22

## 2022-11-04 ENCOUNTER — Telehealth: Payer: Self-pay | Admitting: Nurse Practitioner

## 2022-11-04 DIAGNOSIS — R21 Rash and other nonspecific skin eruption: Secondary | ICD-10-CM

## 2022-11-04 NOTE — Telephone Encounter (Signed)
Seen via video on 10/22/22. Please advise. Thank you

## 2022-11-04 NOTE — Telephone Encounter (Signed)
Patient is requesting updated work excuse from having Covid. She has been out of work from 10/22/22 until 11/04/22 . She went back today

## 2022-11-05 NOTE — Telephone Encounter (Signed)
Encourage patient to contact the pharmacy for refills or they can request refills through [facility]  (Please schedule appointment if patient has not been seen in over a year)    WHAT PHARMACY WOULD THEY LIKE THIS SENT TO: [id number] - [place], [place] - [address]   MEDICATION NAME & DOSE:triamcinolone cream (KENALOG) 0.1 %   NOTES/COMMENTS FROM PATIENT:Also pt needs the return to work note       Front office please notify patient: It takes 48-72 hours to process rx refill requests Ask patient to call pharmacy to ensure rx is ready before heading there.

## 2022-11-05 NOTE — Telephone Encounter (Signed)
Please advise. Thank you

## 2022-11-06 MED ORDER — TRIAMCINOLONE ACETONIDE 0.1 % EX CREA: 1.0000 | TOPICAL_CREAM | Freq: Two times a day (BID) | CUTANEOUS | 0 refills | Status: DC

## 2022-11-06 NOTE — Telephone Encounter (Signed)
Pt contacted and verbalized understanding.  

## 2022-12-03 ENCOUNTER — Other Ambulatory Visit: Payer: Self-pay | Admitting: Family Medicine

## 2023-01-03 ENCOUNTER — Other Ambulatory Visit: Payer: Self-pay | Admitting: Family Medicine

## 2023-02-04 ENCOUNTER — Other Ambulatory Visit: Payer: Self-pay | Admitting: Family Medicine

## 2023-02-21 ENCOUNTER — Ambulatory Visit (INDEPENDENT_AMBULATORY_CARE_PROVIDER_SITE_OTHER): Payer: Self-pay | Admitting: Family Medicine

## 2023-02-21 VITALS — BP 124/86 | HR 118 | Temp 97.3°F | Ht 64.0 in | Wt 169.0 lb

## 2023-02-21 DIAGNOSIS — R21 Rash and other nonspecific skin eruption: Secondary | ICD-10-CM | POA: Insufficient documentation

## 2023-02-21 MED ORDER — CLOBETASOL PROPIONATE 0.05 % EX OINT: 1.0000 | TOPICAL_OINTMENT | Freq: Two times a day (BID) | CUTANEOUS | 0 refills | Status: DC

## 2023-02-21 MED ORDER — HYDROXYZINE PAMOATE 25 MG PO CAPS: 25.0000 mg | ORAL_CAPSULE | Freq: Three times a day (TID) | ORAL | 6 refills | Status: DC | PRN

## 2023-02-21 NOTE — Progress Notes (Signed)
Subjective:  Patient ID: Brittany [name], female    DOB: [birth year]  Age: 47 y.o. MRN: [id number]  CC: Chief Complaint  Patient presents with   Rash    Follow up itching all over going on snce November 23 after covid. Located on arms, back and lower legs , ankles swelling using triamcinolone    HPI:  47 year old female presents with rash.  Patient reports that this has been going on since December.  She states that she has diffuse itching.  She has a rash to her anterior lower legs.  She has been using triamcinolone without resolution.  She states that the triamcinolone helps the itching but does not resolve the rash.  She has been taking Benadryl for generalized itching.  Patient Active Problem List   Diagnosis Date Noted   Rash 02/21/2023   Morbid obesity ([place]) 08/25/2020   Acne rosacea 08/22/2020   Benign essential HTN 05/07/2018   Chronic pain 05/07/2018   GERD (gastroesophageal reflux disease) 05/07/2018   Anxiety    Bipolar 1 disorder ([place]) 08/20/2012   Alcohol abuse 05/03/2012   Closed left ankle fracture 05/03/2012    Social Hx   Social History   Socioeconomic History   Marital status: Single    Spouse name: Not on file   Number of children: Not on file   Years of education: Not on file   Highest education level: Not on file  Occupational History   Not on file  Tobacco Use   Smoking status: Former    Years: 10.00    Types: Cigarettes    Quit date: 08/22/2017    Years since quitting: 5.5   Smokeless tobacco: Never  Vaping Use   Vaping Use: Former   Quit date: 06/21/2020  Substance and Sexual Activity   Alcohol use: Not Currently    Comment: 1-3 drinks per week-REPORTED 11/20/21 STOPPED MONTH AGO"   Drug use: No   Sexual activity: Yes    Birth control/protection: Surgical  Other Topics Concern   Not on file  Social History Narrative   Not on file   Social Determinants of Health   Financial Resource Strain: Not on file  Food Insecurity: Not on  file  Transportation Needs: Not on file  Physical Activity: Not on file  Stress: Not on file  Social Connections: Not on file    Review of Systems Per HPI  Objective:  BP 124/86   Pulse (!) 118   Temp (!) 97.3 F (36.3 C)   Ht '5\' 4"'$  (1.626 m)   Wt 169 lb (76.7 kg)   SpO2 99%   BMI 29.01 kg/m      02/21/2023   11:14 AM 11/20/2021   10:30 AM 11/20/2021   10:20 AM  BP/Weight  Systolic BP [id number]  Diastolic BP 86 92 88  Wt. (Lbs) 169    BMI 29.01 kg/m2      Physical Exam Vitals and nursing note reviewed.  Constitutional:      General: She is not in acute distress.    Appearance: Normal appearance.  HENT:     Head: Normocephalic and atraumatic.  Eyes:     General:        Right eye: No discharge.        Left eye: No discharge.     Conjunctiva/sclera: Conjunctivae normal.  Pulmonary:     Effort: Pulmonary effort is normal. No respiratory distress.  Skin:    Comments: Anterior lower extremities with erythematous, raised  rash with evidence of excoriation.  Neurological:     Mental Status: She is alert.     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 Clobetasol as directed.  Atarax as directed as well.  If continues to persist, recommended visit with her dermatologist to discuss biopsy.       Meds ordered this encounter  Medications   clobetasol ointment (TEMOVATE) 0.05 %    Sig: Apply 1 Application topically 2 (two) times daily.    Dispense:  60 g     Refill:  0   hydrOXYzine (VISTARIL) 25 MG capsule    Sig: Take 1 capsule (25 mg total) by mouth every 8 (eight) hours as needed for itching.    Dispense:  30 capsule    Refill:  [place]

## 2023-02-21 NOTE — Assessment & Plan Note (Signed)
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 Clobetasol as directed.  Atarax as directed as well.  If continues to persist, recommended visit with her dermatologist to discuss biopsy.

## 2023-02-21 NOTE — Patient Instructions (Signed)
Medication as directed.  Referral placed.

## 2023-02-28 ENCOUNTER — Ambulatory Visit (INDEPENDENT_AMBULATORY_CARE_PROVIDER_SITE_OTHER): Payer: Self-pay | Admitting: Family Medicine

## 2023-02-28 VITALS — BP 126/80 | HR 113 | Wt 166.0 lb

## 2023-02-28 DIAGNOSIS — R55 Syncope and collapse: Secondary | ICD-10-CM
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
## 2023-03-07 ENCOUNTER — Other Ambulatory Visit: Payer: Self-pay | Admitting: Family Medicine

## 2023-03-28 ENCOUNTER — Other Ambulatory Visit: Payer: Self-pay | Admitting: Family Medicine

## 2023-05-16 ENCOUNTER — Other Ambulatory Visit: Payer: Self-pay | Admitting: Family Medicine

## 2023-07-08 ENCOUNTER — Other Ambulatory Visit: Payer: Self-pay | Admitting: Nurse Practitioner

## 2023-08-18 ENCOUNTER — Other Ambulatory Visit: Payer: Self-pay | Admitting: Nurse Practitioner

## 2023-08-20 ENCOUNTER — Other Ambulatory Visit: Payer: Self-pay | Admitting: Nurse Practitioner

## 2023-10-25 ENCOUNTER — Inpatient Hospital Stay (HOSPITAL_COMMUNITY)
Admission: EM | Admit: 2023-10-25 | Discharge: 2023-10-30 | DRG: 439 | Disposition: A | Discharge status: Home / Self Care | Payer: Self-pay | Attending: Internal Medicine | Admitting: Internal Medicine

## 2023-10-25 ENCOUNTER — Emergency Department (HOSPITAL_COMMUNITY): Payer: Self-pay

## 2023-10-25 ENCOUNTER — Other Ambulatory Visit: Payer: Self-pay

## 2023-10-25 ENCOUNTER — Encounter (HOSPITAL_COMMUNITY): Payer: Self-pay

## 2023-10-25 DIAGNOSIS — R109 Unspecified abdominal pain: Secondary | ICD-10-CM

## 2023-10-25 DIAGNOSIS — I82891 Chronic embolism and thrombosis of other specified veins: Secondary | ICD-10-CM | POA: Diagnosis present

## 2023-10-25 DIAGNOSIS — Z87891 Personal history of nicotine dependence: Secondary | ICD-10-CM

## 2023-10-25 DIAGNOSIS — E876 Hypokalemia: Secondary | ICD-10-CM | POA: Diagnosis present

## 2023-10-25 DIAGNOSIS — Z86718 Personal history of other venous thrombosis and embolism: Secondary | ICD-10-CM

## 2023-10-25 DIAGNOSIS — I341 Nonrheumatic mitral (valve) prolapse: Secondary | ICD-10-CM | POA: Diagnosis present

## 2023-10-25 DIAGNOSIS — Z881 Allergy status to other antibiotic agents status: Secondary | ICD-10-CM

## 2023-10-25 DIAGNOSIS — Z8249 Family history of ischemic heart disease and other diseases of the circulatory system: Secondary | ICD-10-CM

## 2023-10-25 DIAGNOSIS — R7989 Other specified abnormal findings of blood chemistry: Secondary | ICD-10-CM | POA: Diagnosis present

## 2023-10-25 DIAGNOSIS — R0609 Other forms of dyspnea: Secondary | ICD-10-CM

## 2023-10-25 DIAGNOSIS — Z6826 Body mass index (BMI) 26.0-26.9, adult: Secondary | ICD-10-CM

## 2023-10-25 DIAGNOSIS — R188 Other ascites: Secondary | ICD-10-CM | POA: Diagnosis present

## 2023-10-25 DIAGNOSIS — E440 Moderate protein-calorie malnutrition: Secondary | ICD-10-CM | POA: Insufficient documentation

## 2023-10-25 DIAGNOSIS — R9431 Abnormal electrocardiogram [ECG] [EKG]: Secondary | ICD-10-CM | POA: Diagnosis present

## 2023-10-25 DIAGNOSIS — K863 Pseudocyst of pancreas: Principal | ICD-10-CM | POA: Diagnosis present

## 2023-10-25 DIAGNOSIS — F319 Bipolar disorder, unspecified: Secondary | ICD-10-CM | POA: Diagnosis present

## 2023-10-25 DIAGNOSIS — Z23 Encounter for immunization: Secondary | ICD-10-CM

## 2023-10-25 DIAGNOSIS — Z79899 Other long term (current) drug therapy: Secondary | ICD-10-CM

## 2023-10-25 DIAGNOSIS — K861 Other chronic pancreatitis: Secondary | ICD-10-CM | POA: Diagnosis present

## 2023-10-25 DIAGNOSIS — K219 Gastro-esophageal reflux disease without esophagitis: Secondary | ICD-10-CM | POA: Diagnosis present

## 2023-10-25 DIAGNOSIS — R1084 Generalized abdominal pain: Secondary | ICD-10-CM

## 2023-10-25 DIAGNOSIS — I10 Essential (primary) hypertension: Secondary | ICD-10-CM | POA: Diagnosis present

## 2023-10-25 DIAGNOSIS — K760 Fatty (change of) liver, not elsewhere classified: Secondary | ICD-10-CM | POA: Diagnosis present

## 2023-10-25 DIAGNOSIS — R600 Localized edema: Secondary | ICD-10-CM

## 2023-10-25 DIAGNOSIS — Z885 Allergy status to narcotic agent status: Secondary | ICD-10-CM

## 2023-10-25 LAB — CBC
HCT: 32.8 % — ABNORMAL LOW (ref 36.0–46.0)
Hemoglobin: 10.8 g/dL — ABNORMAL LOW (ref 12.0–15.0)
MCH: 30.6 pg (ref 26.0–34.0)
MCHC: 32.9 g/dL (ref 30.0–36.0)
MCV: 92.9 fL (ref 80.0–100.0)
Platelets: 259 10*3/uL (ref 150–400)
RBC: 3.53 MIL/uL — ABNORMAL LOW (ref 3.87–5.11)
RDW: 16.8 % — ABNORMAL HIGH (ref 11.5–15.5)
WBC: 5.3 10*3/uL (ref 4.0–10.5)
nRBC: 0 % (ref 0.0–0.2)

## 2023-10-25 LAB — COMPREHENSIVE METABOLIC PANEL
ALT: 23 U/L (ref 0–44)
AST: 58 U/L — ABNORMAL HIGH (ref 15–41)
Albumin: 2 g/dL — ABNORMAL LOW (ref 3.5–5.0)
Alkaline Phosphatase: 146 U/L — ABNORMAL HIGH (ref 38–126)
Anion gap: 12 (ref 5–15)
BUN: 8 mg/dL (ref 6–20)
CO2: 31 mmol/L (ref 22–32)
Calcium: 7.6 mg/dL — ABNORMAL LOW (ref 8.9–10.3)
Chloride: 89 mmol/L — ABNORMAL LOW (ref 98–111)
Creatinine, Ser: 0.64 mg/dL (ref 0.44–1.00)
GFR, Estimated: >60 mL/min (ref >60)
Glucose, Bld: 105 mg/dL — ABNORMAL HIGH (ref 70–99)
Potassium: 3.2 mmol/L — ABNORMAL LOW (ref 3.5–5.1)
Sodium: 132 mmol/L — ABNORMAL LOW (ref 135–145)
Total Bilirubin: 2 mg/dL — ABNORMAL HIGH (ref 0.3–1.2)
Total Protein: 6.6 g/dL (ref 6.5–8.1)

## 2023-10-25 LAB — D-DIMER, QUANTITATIVE: D-Dimer, Quant: 1.09 ug{FEU}/mL — ABNORMAL HIGH (ref 0.00–0.50)

## 2023-10-25 LAB — TROPONIN I (HIGH SENSITIVITY)
Troponin I (High Sensitivity): 6 ng/L (ref <18)
Troponin I (High Sensitivity): 7 ng/L (ref <18)

## 2023-10-25 LAB — LIPASE, BLOOD: Lipase: 19 U/L (ref 11–51)

## 2023-10-25 LAB — BRAIN NATRIURETIC PEPTIDE: B Natriuretic Peptide: 90 pg/mL (ref 0.0–100.0)

## 2023-10-25 LAB — HCG, SERUM, QUALITATIVE: Preg, Serum: NEGATIVE

## 2023-10-25 MED ORDER — ONDANSETRON 8 MG PO TBDP
8.0000 mg | ORAL_TABLET | Freq: Once | ORAL | Status: AC
  Administered 2023-10-25: 8 mg via ORAL
  Filled 2023-10-25: qty 1

## 2023-10-25 MED ORDER — CALCIUM CARBONATE ANTACID 500 MG PO CHEW
400.0000 mg | CHEWABLE_TABLET | Freq: Once | ORAL | Status: AC
  Administered 2023-10-25: 400 mg via ORAL
  Filled 2023-10-25: qty 2

## 2023-10-25 MED ORDER — FENTANYL CITRATE PF 50 MCG/ML IJ SOSY
50.0000 ug | PREFILLED_SYRINGE | Freq: Once | INTRAMUSCULAR | Status: AC
  Administered 2023-10-25: 50 ug via INTRAVENOUS
  Filled 2023-10-25: qty 1

## 2023-10-25 MED ORDER — POTASSIUM CHLORIDE CRYS ER 20 MEQ PO TBCR
40.0000 meq | EXTENDED_RELEASE_TABLET | Freq: Once | ORAL | Status: AC
  Administered 2023-10-25: 40 meq via ORAL
  Filled 2023-10-25: qty 2

## 2023-10-25 NOTE — ED Notes (Signed)
CT informed this RN that the IV that patient had will not cooperate for scans to be done. Pt is transfer from [place] and she had gotten an ultrasound Iv done there. Will consult our IV team.

## 2023-10-25 NOTE — ED Provider Notes (Cosign Needed Addendum)
[place] EMERGENCY DEPARTMENT AT [facility] Provider Note   CSN: [id number] Arrival date & time: 10/25/23  1212     History  Chief Complaint  Patient presents with   Abdominal Pain   Leg Swelling    Brittany [name] is a 47 y.o. female with medical history of pancreatitis, MVP, hypertension, GERD, bipolar 1 disorder, anxiety, alcohol abuse.  Patient reports to ED for evaluation of multiple complaints. Patient complains of abdominal pain, nausea, vomiting, dizziness, shortness of breath, chest pain since August/September.  Patient also reporting that she has been unable to have a bowel movement for the last 1 week.  Patient reports history of pancreatitis, states that she has not seen a GI doctor since December.  She reports that her symptoms have been ongoing since either August or September, she is unsure.  States that she gets short of breath with exertion and also has episodes of chest pain that last for maybe 1 minute, do not radiate and are located centrally in her chest.  She denies any alcohol or drug use.  She is also complaining of darker colored urine, leg swelling.  Denies dysuria.  Denies history of CHF.    Abdominal Pain Associated symptoms: chest pain, constipation, nausea, shortness of breath and vomiting   Associated symptoms: no dysuria and no fever        Home Medications Prior to Admission medications   Medication Sig Start Date End Date Taking? Authorizing Provider  acetaminophen (TYLENOL) 500 MG tablet Take 500 mg by mouth every 6 (six) hours as needed for moderate pain.    [provider]  clobetasol ointment (TEMOVATE) 0.05 % Apply 1 Application topically 2 (two) times daily. 02/21/23   Tommie Sams, DO  hydrOXYzine (VISTARIL) 25 MG capsule Take 1 capsule (25 mg total) by mouth every 8 (eight) hours as needed for itching. 02/21/23   Tommie Sams, DO  omeprazole (PRILOSEC) 40 MG capsule Take 1 capsule by mouth once daily 08/20/23   Luking,  Scott A, MD  ondansetron (ZOFRAN) 4 MG tablet TAKE 1 TABLET BY MOUTH EVERY 8 HOURS AS NEEDED FOR NAUSEA OR VOMITING 08/21/23   Luking, Jonna Coup, MD  SUBOXONE 8-2 MG FILM Place 2 Film under the tongue 2 (two) times daily. 04/22/18   [provider]      Allergies    Vicoprofen [hydrocodone-ibuprofen], Doxycycline, and Morphine and codeine    Review of Systems   Review of Systems  Constitutional:  Negative for fever.  Respiratory:  Positive for shortness of breath.   Cardiovascular:  Positive for chest pain and leg swelling.  Gastrointestinal:  Positive for abdominal pain, constipation, nausea and vomiting.  Genitourinary:  Negative for dysuria.  Neurological:  Positive for dizziness and light-headedness.  All other systems reviewed and are negative.   Physical Exam Updated Vital Signs BP 121/89   Pulse 95   Temp 98.2 F (36.8 C)   Resp 16   Ht 5\' 4"  (1.626 m)   Wt 69.4 kg   LMP 08/24/2023 (Approximate)   SpO2 100%   BMI 26.26 kg/m  Physical Exam Vitals and nursing note reviewed.  Constitutional:      General: She is not in acute distress.    Appearance: Normal appearance. She is not ill-appearing, toxic-appearing or diaphoretic.  HENT:     Head: Normocephalic and atraumatic.     Nose: Nose normal.     Mouth/Throat:     Mouth: Mucous membranes are moist.  Pharynx: Oropharynx is clear.  Eyes:     Extraocular Movements: Extraocular movements intact.     Conjunctiva/sclera: Conjunctivae normal.     Pupils: Pupils are equal, round, and reactive to light.  Cardiovascular:     Rate and Rhythm: Normal rate and regular rhythm.  Pulmonary:     Effort: Pulmonary effort is normal.     Breath sounds: Normal breath sounds. No wheezing.  Abdominal:     General: Abdomen is flat. Bowel sounds are normal.     Palpations: Abdomen is soft.     Tenderness: There is abdominal tenderness.     Comments: Nonfocal abdominal tenderness, no overlying skin change  Musculoskeletal:      Cervical back: Normal range of motion and neck supple. No tenderness.     Right lower leg: Edema present.     Left lower leg: Edema present.     Comments: Minimal edema to bilateral lower extremities  Skin:    General: Skin is warm and dry.     Capillary Refill: Capillary refill takes less than 2 seconds.  Neurological:     Mental Status: She is alert and oriented to person, place, and time.     ED Results / Procedures / Treatments   Labs (all labs ordered are listed, but only abnormal results are displayed) Labs Reviewed  CBC - Abnormal; Notable for the following components:      Result Value   RBC 3.53 (*)    Hemoglobin 10.8 (*)    HCT 32.8 (*)    RDW 16.8 (*)    All other components within normal limits  COMPREHENSIVE METABOLIC PANEL - Abnormal; Notable for the following components:   Sodium 132 (*)    Potassium 3.2 (*)    Chloride 89 (*)    Glucose, Bld 105 (*)    Calcium 7.6 (*)    Albumin 2.0 (*)    AST 58 (*)    Alkaline Phosphatase 146 (*)    Total Bilirubin 2.0 (*)    All other components within normal limits  D-DIMER, QUANTITATIVE - Abnormal; Notable for the following components:   D-Dimer, Quant 1.09 (*)    All other components within normal limits  LIPASE, BLOOD  BRAIN NATRIURETIC PEPTIDE  URINALYSIS, ROUTINE W REFLEX MICROSCOPIC  TROPONIN I (HIGH SENSITIVITY)  TROPONIN I (HIGH SENSITIVITY)    EKG None  Radiology DG Chest 2 View  Result Date: 10/25/2023 CLINICAL DATA:  Dyspnea on exertion. Abdominal pain with nausea, vomiting, dizziness, shortness of breath since August or September. EXAM: CHEST - 2 VIEW COMPARISON:  No prior chest radiographs are available. Abdominal CT 09/10/2021. FINDINGS: The heart size and mediastinal contours are. Mild left basilar atelectasis or scarring. The lungs are otherwise clear. There is no effusion or pneumothorax. Moderate pectus deformity of the sternum without acute osseous abnormality. IMPRESSION: Mild left basilar  atelectasis or scarring. No acute cardiopulmonary process. Electronically Signed   By: Carey Bullocks M.D.   On: 10/25/2023 13:30    Procedures Procedures   Medications Ordered in ED Medications  ondansetron (ZOFRAN-ODT) disintegrating tablet 8 mg (8 mg Oral Given 10/25/23 1347)  fentaNYL (SUBLIMAZE) injection 50 mcg (50 mcg Intravenous Given 10/25/23 1550)  calcium carbonate (TUMS - dosed in mg elemental calcium) chewable tablet 400 mg of elemental calcium (400 mg of elemental calcium Oral Given 10/25/23 1658)  potassium chloride SA (KLOR-CON M) CR tablet 40 mEq (40 mEq Oral Given 10/25/23 1659)    ED Course/ Medical Decision Making/ A&P  Medical Decision Making Amount and/or Complexity of Data Reviewed Labs: ordered. Radiology: ordered.  Risk OTC drugs. Prescription drug management.   47 year old female presents to the ED for evaluation.  Please see HPI for further details.  On examination patient initially tachycardic however on reassessment patient pulse rate has normalized, she has afebrile.  Lung sounds are clear bilaterally, she is not hypoxic.  Abdomen has nonfocal tenderness throughout without overlying skin change, no rebound or guarding, no CVA tenderness bilaterally.  Neurological examination at baseline.  Patient does have minimal bilateral edema to lower extremities nonpitting.  Overall patient does appear chronically ill.  Patient has multiple complaints, differential for the patient is very broad.  Patient CBC without leukocytosis, hemoglobin 10.8.  Patient reports history of anemia, states that she has not taken her iron pills in quite some time.  The patient metabolic panel shows sodium 132, potassium 3.2 repleted with 40 mEq oral potassium, calcium 7.6 repleted with 400 mg elemental calcium.  Patient AST elevated to 58, albumin decreased 2.0, alk phos 146.  Patient lipase WNL so doubt acute pancreatitis at this time.  Patient troponin 6, 7 with delta.  BNP not  elevated so doubt CHF as cause of patient lower extremity swelling.  The patient low albumin could be the cause of lower extremity swelling.  Urinalysis pending at this time.  Patient D-dimer elevated 1.09, she arrived tachycardic so PERC criteria was not applicable.  Chest x-ray shows no acute process.  At this time wish to CT scan patient abdomen and pelvis as well as CT angiogram to rule out blood clot for elevated D-dimer.  CT scanner here at this facility, [facility], is not working at this time.  Will send the patient POV to [facility] for further workup.  The patient is agreeable to this plan.  The patient is stable to go POV.  Reached out to Dr. Theresia Lo, ED doctor at [facility], who has excepted patient.   Final Clinical Impression(s) / ED Diagnoses Final diagnoses:  Peripheral edema  Dyspnea on exertion  Generalized abdominal pain    Rx / DC Orders ED Discharge Orders     None             Al Decant, PA-C 10/25/23 1803    Lonell Grandchild, MD 10/26/23 [phone]

## 2023-10-25 NOTE — ED Provider Notes (Signed)
  Provider Note MRN:  [id number]  Arrival date & time: 10/26/23    ED Course and Medical Decision Making  Assumed care from Dr Silverio Lay at shift change.  See note from prior team for complete details, in brief:  47 yo female Sent  from [facility] for imaging as CT scanner broken at [facility] Pending CTPE and CTAP  Plan per prior physician f/u imaging  CTPE resulted > no PE CTAP resulted > " 2. Progressive pancreatic atrophy with development of multiple  peripancreatic fluid collections. 2.9 cm peripancreatic pseudocyst  along the tail the pancreas. More centrally adjacent to the body of  the pancreas and just superior to the celiac axis within the  gastrohepatic ligament is a more inflammatory collection  demonstrating surrounding inflammatory stranding measuring 2.6 x 3.0  cm. This may represent an acute peripancreatic inflammatory  collection or reflect superimposed infection of a pre-existing  peripancreatic fluid collection.  " Her lipase is not elevated, no leukocytosis, no jaundice. Abd not peritoneal but still having sig pain epigastrium. Evidence if inflammation around the pseudocyst concerning for possible inflammation. She is afebrile, not septic. She is still having significant pain and unable to tolerate PO.   Dr Eliot Ford consulted with gen surg, will eval  Will plan for admission for intractable pain/nausea and for further eval of pseudocyst   Admitted [facility] Dr Antionette Char        .Critical Care  Performed by: Sloan Leiter, DO Authorized by: Sloan Leiter, DO   Critical care provider statement:    Critical care time (minutes):  30   Critical care time was exclusive of:  Separately billable procedures and treating other patients   Critical care was time spent personally by me on the following activities:  Development of treatment plan with patient or surrogate, discussions with consultants, evaluation of patient's response to treatment, examination of patient, ordering and review of  laboratory studies, ordering and review of radiographic studies, ordering and performing treatments and interventions, pulse oximetry, re-evaluation of patient's condition, review of old charts and obtaining history from patient or surrogate   Care discussed with: admitting provider     Final Clinical Impressions(s) / ED Diagnoses     ICD-10-CM   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 Peripheral edema  R60.0     3. Dyspnea on exertion  R06.09     4. Generalized abdominal pain  R10.84     5. Other ascites  R18.8     6. Hepatic steatosis  K76.0     7. Intractable abdominal pain  R10.9       ED Discharge Orders     None       Discharge Instructions   None        Sloan Leiter, DO 10/26/23 0865

## 2023-10-25 NOTE — ED Provider Triage Note (Signed)
Emergency Medicine Provider Triage Evaluation Note  Brittany [name] , a 47 y.o. female  was evaluated in triage.  Pt complains of abdominal pain, nausea, vomiting, dizziness, shortness of breath, chest pain since August/September.  Patient reports history of pancreatitis, states that she has not seen a GI doctor since December.  She reports that her symptoms been ongoing since either August or September, she is unsure.  States that she gets short of breath with exertion and also has episodes of chest pain that last for maybe 1 minute, do not radiate and are located centrally in her chest.  She denies any alcohol or drug use.  She is also complaining of darker colored urine, leg swelling.  Denies dysuria.  Denies history of CHF.  Review of Systems  Positive:  Negative:   Physical Exam  BP (!) 139/115 (BP Location: Right Arm)   Pulse (!) 129   Temp 97.9 F (36.6 C)   Resp 18   Ht 5\' 4"  (1.626 m)   Wt 69.4 kg   LMP 08/24/2023 (Approximate)   SpO2 99%   BMI 26.26 kg/m  Gen:   Awake, no distress   Resp:  Normal effort  MSK:   Moves extremities without difficulty  Other:  Minimal edema to bilateral lower extremities.  Lung sounds clear.  Medical Decision Making  Medically screening exam initiated at 1:09 PM.  Appropriate orders placed.  Brittany [name] was informed that the remainder of the evaluation will be completed by another provider, this initial triage assessment does not replace that evaluation, and the importance of remaining in the ED until their evaluation is complete.     Al Decant, PA-C 10/25/23 1311

## 2023-10-25 NOTE — ED Notes (Signed)
EDP at [facility]

## 2023-10-25 NOTE — ED Notes (Signed)
Pt states she has a hx of hypertension.

## 2023-10-25 NOTE — ED Triage Notes (Signed)
Pt reports abdominal pain and vomiting for several months with a hx of pancreatitis and has leg and feet swelling over the past 1.5 months that is causing her to feel like she can not walk.  Reports numbness to the feet and hands x 1.5 months as well and has not been able to follow up with doctors.

## 2023-10-25 NOTE — ED Provider Notes (Signed)
  Physical Exam  BP 123/88   Pulse 91   Temp 98.6 F (37 C)   Resp 16   Ht 5\' 4"  (1.626 m)   Wt 69.4 kg   LMP 08/24/2023 (Approximate)   SpO2 99%   BMI 26.26 kg/m   Physical Exam  Procedures  Procedures  ED Course / MDM    Medical Decision Making Care assumed upon transfer from [facility] for [facility]. patient has abdominal pain and some shortness of breath.  D-dimer is elevated.  CTA chest and CT abdomen pelvis is pending.  11:07 PM  Signed out to Dr. Wallace Cullens in the ED.   Amount and/or Complexity of Data Reviewed Labs: ordered. Radiology: ordered.  Risk OTC drugs. Prescription drug management.          Charlynne Pander, MD 10/25/23 [phone]

## 2023-10-26 ENCOUNTER — Emergency Department (HOSPITAL_COMMUNITY): Payer: Self-pay

## 2023-10-26 ENCOUNTER — Encounter (HOSPITAL_COMMUNITY): Payer: Self-pay | Admitting: Family Medicine

## 2023-10-26 DIAGNOSIS — K859 Acute pancreatitis without necrosis or infection, unspecified: Secondary | ICD-10-CM

## 2023-10-26 DIAGNOSIS — E876 Hypokalemia: Secondary | ICD-10-CM | POA: Diagnosis present

## 2023-10-26 DIAGNOSIS — R9431 Abnormal electrocardiogram [ECG] [EKG]: Secondary | ICD-10-CM | POA: Diagnosis present

## 2023-10-26 DIAGNOSIS — R7989 Other specified abnormal findings of blood chemistry: Secondary | ICD-10-CM | POA: Diagnosis present

## 2023-10-26 DIAGNOSIS — F418 Other specified anxiety disorders: Secondary | ICD-10-CM | POA: Insufficient documentation

## 2023-10-26 DIAGNOSIS — K863 Pseudocyst of pancreas: Secondary | ICD-10-CM | POA: Diagnosis present

## 2023-10-26 LAB — COMPREHENSIVE METABOLIC PANEL
ALT: 23 U/L (ref 0–44)
AST: 56 U/L — ABNORMAL HIGH (ref 15–41)
Albumin: 1.6 g/dL — ABNORMAL LOW (ref 3.5–5.0)
Alkaline Phosphatase: 115 U/L (ref 38–126)
Anion gap: 11 (ref 5–15)
BUN: 6 mg/dL (ref 6–20)
CO2: 27 mmol/L (ref 22–32)
Calcium: 7.2 mg/dL — ABNORMAL LOW (ref 8.9–10.3)
Chloride: 94 mmol/L — ABNORMAL LOW (ref 98–111)
Creatinine, Ser: 0.68 mg/dL (ref 0.44–1.00)
GFR, Estimated: >60 mL/min (ref >60)
Glucose, Bld: 82 mg/dL (ref 70–99)
Potassium: 4 mmol/L (ref 3.5–5.1)
Sodium: 132 mmol/L — ABNORMAL LOW (ref 135–145)
Total Bilirubin: 1.8 mg/dL — ABNORMAL HIGH (ref 0.3–1.2)
Total Protein: 5.6 g/dL — ABNORMAL LOW (ref 6.5–8.1)

## 2023-10-26 LAB — CBC
HCT: 28.1 % — ABNORMAL LOW (ref 36.0–46.0)
Hemoglobin: 9.2 g/dL — ABNORMAL LOW (ref 12.0–15.0)
MCH: 30.4 pg (ref 26.0–34.0)
MCHC: 32.7 g/dL (ref 30.0–36.0)
MCV: 92.7 fL (ref 80.0–100.0)
Platelets: 199 10*3/uL (ref 150–400)
RBC: 3.03 MIL/uL — ABNORMAL LOW (ref 3.87–5.11)
RDW: 17.1 % — ABNORMAL HIGH (ref 11.5–15.5)
WBC: 3.2 10*3/uL — ABNORMAL LOW (ref 4.0–10.5)
nRBC: 0 % (ref 0.0–0.2)

## 2023-10-26 LAB — HIV ANTIBODY (ROUTINE TESTING W REFLEX): HIV Screen 4th Generation wRfx: NONREACTIVE

## 2023-10-26 MED ORDER — ORAL CARE MOUTH RINSE: 15.0000 mL | OROMUCOSAL | Status: DC | PRN

## 2023-10-26 MED ORDER — ACETAMINOPHEN 325 MG PO TABS
650.0000 mg | ORAL_TABLET | Freq: Four times a day (QID) | ORAL | Status: DC | PRN
  Administered 2023-10-26: 650 mg via ORAL
  Filled 2023-10-26: qty 2

## 2023-10-26 MED ORDER — ACETAMINOPHEN 650 MG RE SUPP: 650.0000 mg | Freq: Four times a day (QID) | RECTAL | Status: DC | PRN

## 2023-10-26 MED ORDER — SENNOSIDES-DOCUSATE SODIUM 8.6-50 MG PO TABS: 1.0000 | ORAL_TABLET | Freq: Every evening | ORAL | Status: DC | PRN

## 2023-10-26 MED ORDER — FENTANYL CITRATE PF 50 MCG/ML IJ SOSY
50.0000 ug | PREFILLED_SYRINGE | Freq: Once | INTRAMUSCULAR | Status: AC
  Administered 2023-10-26: 50 ug via INTRAVENOUS
  Filled 2023-10-26: qty 1

## 2023-10-26 MED ORDER — SODIUM CHLORIDE 0.9% FLUSH
3.0000 mL | Freq: Two times a day (BID) | INTRAVENOUS | Status: DC
  Administered 2023-10-26 – 2023-10-30 (×6): 3 mL via INTRAVENOUS

## 2023-10-26 MED ORDER — ONDANSETRON HCL 4 MG/2ML IJ SOLN
4.0000 mg | Freq: Once | INTRAMUSCULAR | Status: AC
  Administered 2023-10-26: 4 mg via INTRAVENOUS
  Filled 2023-10-26: qty 2

## 2023-10-26 MED ORDER — IOHEXOL 350 MG/ML SOLN
75.0000 mL | Freq: Once | INTRAVENOUS | Status: AC | PRN
  Administered 2023-10-26: 75 mL via INTRAVENOUS

## 2023-10-26 MED ORDER — PIPERACILLIN-TAZOBACTAM 3.375 G IVPB 30 MIN
3.3750 g | Freq: Once | INTRAVENOUS | Status: AC
Start: 2023-10-26 — End: 2023-10-26
  Administered 2023-10-26: 3.375 g via INTRAVENOUS
  Filled 2023-10-26: qty 50

## 2023-10-26 MED ORDER — POTASSIUM CHLORIDE IN NACL 20-0.9 MEQ/L-% IV SOLN
INTRAVENOUS | Status: AC
  Filled 2023-10-26: qty 1000

## 2023-10-26 MED ORDER — PIPERACILLIN-TAZOBACTAM 3.375 G IVPB
3.3750 g | Freq: Three times a day (TID) | INTRAVENOUS | Status: DC
  Administered 2023-10-26: 3.375 g via INTRAVENOUS
  Filled 2023-10-26: qty 50

## 2023-10-26 MED ORDER — SODIUM CHLORIDE 0.9 % IV BOLUS
1000.0000 mL | Freq: Once | INTRAVENOUS | Status: AC
  Administered 2023-10-26: 1000 mL via INTRAVENOUS

## 2023-10-26 MED ORDER — HYDROMORPHONE HCL 1 MG/ML IJ SOLN
0.5000 mg | Freq: Once | INTRAMUSCULAR | Status: AC
  Administered 2023-10-26: 0.5 mg via INTRAVENOUS
  Filled 2023-10-26: qty 1

## 2023-10-26 MED ORDER — HYDROMORPHONE HCL 1 MG/ML IJ SOLN
0.5000 mg | INTRAMUSCULAR | Status: DC | PRN
  Administered 2023-10-26 – 2023-10-28 (×10): 0.5 mg via INTRAVENOUS
  Filled 2023-10-26 (×3): qty 0.5
  Filled 2023-10-26: qty 1
  Filled 2023-10-26 (×6): qty 0.5

## 2023-10-26 MED ORDER — PROCHLORPERAZINE EDISYLATE 10 MG/2ML IJ SOLN: 5.0000 mg | Freq: Four times a day (QID) | INTRAMUSCULAR | Status: DC | PRN

## 2023-10-26 MED ORDER — POTASSIUM CHLORIDE IN NACL 20-0.9 MEQ/L-% IV SOLN
INTRAVENOUS | Status: AC
  Filled 2023-10-26 (×2): qty 1000

## 2023-10-26 NOTE — Progress Notes (Signed)
PROGRESS NOTE    Brittany [name]  WUJ:[id number] DOB: [birth year] DOA: 10/25/2023 PCP: Babs Sciara, MD   Brief Narrative:  Brittany [name] is a 47 y.o. 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   Assessment & Plan:   Principal Problem:   Infected pancreatic pseudocyst Active Problems:   Hypokalemia   Prolonged QT interval   Elevated LFTs  Pancreatitis, recurrent, POA pancreatic pseudocyst  - Worsening epigastric pain and N/V in pt with hx of alcoholic pancreatitis; found to have pseudocysts and surrounding inflammatory changes concerning for possible infection -General Surgery following, appreciate insight recommendations -Continue supportive care, IV fluids -discontinue antibiotics per discussion with surgery -no signs or symptoms of infection at this time -No recommendations to drain at this time   Elevated LFTs  -Likely secondary to above -no signs or symptoms of biliary obstruction, continue to monitor  Hypokalemia  -Replace, follow labs   Prolonged QT interval  - QTc 510 in ED  -Limit QT prolonging medications  DVT prophylaxis: SCDs Start: 10/26/23 0418   Code Status:   Code Status: Full Code  Family Communication: None present  Status is: Inpatient  Dispo: The patient is from: Home              Anticipated d/c is to: Home              Anticipated d/c date is: 40 to 72 hours              Patient currently not medically stable for discharge  Consultants:  Surgery  Procedures:  None  Antimicrobials:  None   Subjective: No acute issues or events overnight currently well-controlled as is nausea vomiting  Objective: Vitals:   10/26/23 0300 10/26/23 0327 10/26/23 0500 10/26/23 0642  BP: (!) 129/101  122/83  (!) 121/96  Pulse: (!) 107 91    Resp:  16  18  Temp:  98.2 F (36.8 C)  98.3 F (36.8 C)  TempSrc:  Oral  Oral  SpO2: 100% 100%  100%  Weight:   66.2 kg   Height:       No intake or output data in the 24 hours ending 10/26/23 0657 Filed Weights   10/25/23 1235 10/26/23 0500  Weight: 69.4 kg 66.2 kg    Examination:  General:  Pleasantly resting in bed, No acute distress. HEENT:  Normocephalic atraumatic.  Sclerae nonicteric, noninjected.  Extraocular movements intact bilaterally. Neck:  Without mass or deformity.  Trachea is midline. Lungs:  Clear to auscultate bilaterally without rhonchi, wheeze, or rales. Heart:  Regular rate and rhythm.  Without murmurs, rubs, or gallops. Abdomen: Soft, diffusely tender, PMI right upper quadrant, nondistended.  Without guarding or rebound. Extremities: Without cyanosis, clubbing, edema, or obvious deformity. Skin:  Warm and dry, no erythema.  Data Reviewed: I have personally reviewed following labs and imaging studies  CBC: Recent Labs  Lab 10/25/23 1336 10/26/23 0508  WBC 5.3 3.2*  HGB 10.8* 9.2*  HCT 32.8* 28.1*  MCV 92.9 92.7  PLT 259 199   Basic Metabolic Panel: Recent Labs  Lab 10/25/23 1336 10/26/23 0508  NA 132* 132*  K 3.2* 4.0  CL 89* 94*  CO2 31 27  GLUCOSE 105* 82  BUN 8 6  CREATININE 0.64 0.68  CALCIUM 7.6* 7.2*   GFR:  Estimated Creatinine Clearance: 82.3 mL/min (by C-G formula based on SCr of 0.68 mg/dL). Liver Function Tests: Recent Labs  Lab 10/25/23 1336 10/26/23 0508  AST 58* 56*  ALT 23 23  ALKPHOS 146* 115  BILITOT 2.0* 1.8*  PROT 6.6 5.6*  ALBUMIN 2.0* 1.6*   Recent Labs  Lab 10/25/23 1336  LIPASE 19   No results found for this or any previous visit (from the past 240 hour(s)).       Radiology Studies: CT ABDOMEN PELVIS W CONTRAST  Result Date: 10/26/2023 CLINICAL DATA:  Low to intermediate probability pulmonary embolism, positive D-dimer, unspecified abdominal pain,  pancreatitis, peripheral edema. EXAM: CT ANGIOGRAPHY CHEST CT ABDOMEN AND PELVIS WITH CONTRAST TECHNIQUE: Multidetector CT imaging of the chest was performed using the standard protocol during bolus administration of intravenous contrast. Multiplanar CT image reconstructions and MIPs were obtained to evaluate the vascular anatomy. Multidetector CT imaging of the abdomen and pelvis was performed using the standard protocol during bolus administration of intravenous contrast. RADIATION DOSE REDUCTION: This exam was performed according to the departmental dose-optimization program which includes automated exposure control, adjustment of the mA and/or kV according to patient size and/or use of iterative reconstruction technique. CONTRAST:  75mL OMNIPAQUE IOHEXOL 350 MG/ML SOLN COMPARISON:  CT abdomen pelvis 09/10/2021 FINDINGS: CTA CHEST FINDINGS Cardiovascular: Adequate opacification of the pulmonary arterial tree. No intraluminal filling defect identified to suggest acute pulmonary embolism. Central pulmonary arteries are of normal caliber. No significant coronary artery calcification. Cardiac size within normal limits. No pericardial effusion. No significant atherosclerotic calcification within the thoracic aorta. No aortic aneurysm. Mediastinum/Nodes: No enlarged mediastinal, hilar, or axillary lymph nodes. Thyroid gland, trachea, and esophagus demonstrate no significant findings. Lungs/Pleura: Lungs are clear. No pleural effusion or pneumothorax. Musculoskeletal: No acute bone abnormality. Mild pectus excavatum deformity. No lytic or blastic bone lesion. Review of the MIP images confirms the above findings. CT ABDOMEN and PELVIS FINDINGS Hepatobiliary: Severe, heterogeneous hepatic steatosis. No enhancing intrahepatic mass. No intra or extrahepatic biliary ductal dilation. Gallbladder unremarkable. Pancreas: The pancreas is atrophic, progressive since prior examination, with development of multiple peripancreatic  fluid collections. 2.9 cm collection adjacent to the pancreatic tail appears more organized with a thin wall, uniform low attenuation of the internal contents and no significant surrounding inflammatory stranding is in keeping with a peripancreatic pseudocyst. More centrally adjacent to the body of the pancreas and just superior to the celiac axis within the gastrohepatic ligament is a more inflammatory collection demonstrating surrounding inflammatory stranding measuring 2.6 x 3.0 cm at axial image # 29/3. This may represent an acute peripancreatic inflammatory collection or reflect superimposed infection of a pre-existing peripancreatic fluid collection. The pancreatic duct is not dilated. No evidence of frank parenchymal necrosis. Spleen: Normal in size.  No intrasplenic lesion identified. Adrenals/Urinary Tract: Adrenal glands are unremarkable. Kidneys are normal, without renal calculi, focal lesion, or hydronephrosis. Bladder is unremarkable. Stomach/Bowel: Moderate ascites the stomach, small bowel, and large bowel are unremarkable. No evidence of obstruction or focal inflammation. No free intraperitoneal gas. Appendix normal. Vascular/Lymphatic: The splenic vein is chronically thrombosed with numerous gastroepiploic collaterals identified. Superior mesenteric vein and portal vein are patent. The abdominal vasculature is otherwise unremarkable. No pathologic adenopathy within the abdomen and pelvis. Reproductive: Uterus and bilateral adnexa are unremarkable. Other: No abdominal wall hernia Musculoskeletal: No pathologic adenopathy within the abdomen and pelvis. No lytic or blastic bone lesion. Review of the MIP images confirms the above findings. IMPRESSION: 1. No pulmonary embolism. No acute intrathoracic  pathology identified. 2. Progressive pancreatic atrophy with development of multiple peripancreatic fluid collections. 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 More centrally adjacent to  the body of the pancreas and just superior to the celiac axis within the gastrohepatic ligament is a more inflammatory collection demonstrating surrounding inflammatory stranding measuring 2.6 x 3.0 cm. This may represent an acute peripancreatic inflammatory collection or reflect superimposed infection of a pre-existing peripancreatic fluid collection. 3. Severe, heterogeneous hepatic steatosis. 4. Moderate ascites. 5. 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 Electronically Signed   By: Helyn Numbers M.D.   On: 10/26/2023 01:49   CT Angio Chest PE W and/or Wo Contrast  Result Date: 10/26/2023 CLINICAL DATA:  Low to intermediate probability pulmonary embolism, positive D-dimer, unspecified abdominal pain, pancreatitis, peripheral edema. EXAM: CT ANGIOGRAPHY CHEST CT ABDOMEN AND PELVIS WITH CONTRAST TECHNIQUE: Multidetector CT imaging of the chest was performed using the standard protocol during bolus administration of intravenous contrast. Multiplanar CT image reconstructions and MIPs were obtained to evaluate the vascular anatomy. Multidetector CT imaging of the abdomen and pelvis was performed using the standard protocol during bolus administration of intravenous contrast. RADIATION DOSE REDUCTION: This exam was performed according to the departmental dose-optimization program which includes automated exposure control, adjustment of the mA and/or kV according to patient size and/or use of iterative reconstruction technique. CONTRAST:  75mL OMNIPAQUE IOHEXOL 350 MG/ML SOLN COMPARISON:  CT abdomen pelvis 09/10/2021 FINDINGS: CTA CHEST FINDINGS Cardiovascular: Adequate opacification of the pulmonary arterial tree. No intraluminal filling defect identified to suggest acute pulmonary embolism. Central pulmonary arteries are of normal caliber. No significant coronary artery calcification. Cardiac size within normal limits. No pericardial effusion. No significant  atherosclerotic calcification within the thoracic aorta. No aortic aneurysm. Mediastinum/Nodes: No enlarged mediastinal, hilar, or axillary lymph nodes. Thyroid gland, trachea, and esophagus demonstrate no significant findings. Lungs/Pleura: Lungs are clear. No pleural effusion or pneumothorax. Musculoskeletal: No acute bone abnormality. Mild pectus excavatum deformity. No lytic or blastic bone lesion. Review of the MIP images confirms the above findings. CT ABDOMEN and PELVIS FINDINGS Hepatobiliary: Severe, heterogeneous hepatic steatosis. No enhancing intrahepatic mass. No intra or extrahepatic biliary ductal dilation. Gallbladder unremarkable. Pancreas: The pancreas is atrophic, progressive since prior examination, with development of multiple peripancreatic fluid collections. 2.9 cm collection adjacent to the pancreatic tail appears more organized with a thin wall, uniform low attenuation of the internal contents and no significant surrounding inflammatory stranding is in keeping with a peripancreatic pseudocyst. More centrally adjacent to the body of the pancreas and just superior to the celiac axis within the gastrohepatic ligament is a more inflammatory collection demonstrating surrounding inflammatory stranding measuring 2.6 x 3.0 cm at axial image # 29/3. This may represent an acute peripancreatic inflammatory collection or reflect superimposed infection of a pre-existing peripancreatic fluid collection. The pancreatic duct is not dilated. No evidence of frank parenchymal necrosis. Spleen: Normal in size.  No intrasplenic lesion identified. Adrenals/Urinary Tract: Adrenal glands are unremarkable. Kidneys are normal, without renal calculi, focal lesion, or hydronephrosis. Bladder is unremarkable. Stomach/Bowel: Moderate ascites the stomach, small bowel, and large bowel are unremarkable. No evidence of obstruction or focal inflammation. No free intraperitoneal gas. Appendix normal. Vascular/Lymphatic: The  splenic vein is chronically thrombosed with numerous gastroepiploic collaterals identified. Superior mesenteric vein and portal vein are patent. The abdominal vasculature is otherwise unremarkable. No pathologic adenopathy within the abdomen and pelvis. Reproductive: Uterus and bilateral adnexa are unremarkable. Other: No abdominal wall hernia Musculoskeletal: No  pathologic adenopathy within the abdomen and pelvis. No lytic or blastic bone lesion. Review of the MIP images confirms the above findings. IMPRESSION: 1. No pulmonary embolism. No acute intrathoracic pathology identified. 2. Progressive pancreatic atrophy with development of multiple peripancreatic fluid collections. 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 More centrally adjacent to the body of the pancreas and just superior to the celiac axis within the gastrohepatic ligament is a more inflammatory collection demonstrating surrounding inflammatory stranding measuring 2.6 x 3.0 cm. This may represent an acute peripancreatic inflammatory collection or reflect superimposed infection of a pre-existing peripancreatic fluid collection. 3. Severe, heterogeneous hepatic steatosis. 4. Moderate ascites. 5. 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 Electronically Signed   By: Helyn Numbers M.D.   On: 10/26/2023 01:49   DG Chest 2 View  Result Date: 10/25/2023 CLINICAL DATA:  Dyspnea on exertion. Abdominal pain with nausea, vomiting, dizziness, shortness of breath since August or September. EXAM: CHEST - 2 VIEW COMPARISON:  No prior chest radiographs are available. Abdominal CT 09/10/2021. FINDINGS: The heart size and mediastinal contours are. Mild left basilar atelectasis or scarring. The lungs are otherwise clear. There is no effusion or pneumothorax. Moderate pectus deformity of the sternum without acute osseous abnormality. IMPRESSION: Mild left basilar atelectasis or scarring. No acute  cardiopulmonary process. Electronically Signed   By: Carey Bullocks M.D.   On: 10/25/2023 13:30        Scheduled Meds:  sodium chloride flush  3 mL Intravenous Q12H   Continuous Infusions:  0.9 % NaCl with KCl 20 mEq / L 100 mL/hr at 10/26/23 0642   piperacillin-tazobactam (ZOSYN)  IV 3.375 g (10/26/23 0650)     LOS: 0 days   Time spent:  Azucena Fallen, DO [facility]  If 7PM-7AM, please contact night-coverage [email or web address]  10/26/2023, 6:57 AM

## 2023-10-26 NOTE — ED Notes (Signed)
ED TO INPATIENT HANDOFF REPORT  ED Nurse Name and Phone #:  Minerva Areola [phone]  S Name/Age/Gender Brittany [name] 47 y.o. female Room/Bed: 006C/006C  Code Status   Code Status: Full Code  Home/SNF/Other Home Patient oriented to: self, place, time, and situation Is this baseline? Yes   Triage Complete: Triage complete  Chief Complaint Acute on chronic pancreatitis (HCC) [K85.90, K86.1]  Triage Note Pt reports abdominal pain and vomiting for several months with a hx of pancreatitis and has leg and feet swelling over the past 1.5 months that is causing her to feel like she can not walk.  Reports numbness to the feet and hands x 1.5 months as well and has not been able to follow up with doctors.   Allergies Allergies  Allergen Reactions   Vicoprofen [Hydrocodone-Ibuprofen] Nausea Only   Doxycycline Nausea And Vomiting   Morphine And Codeine Itching    Level of Care/Admitting Diagnosis ED Disposition     ED Disposition  Admit   Condition  --   Comment  Hospital Area: [facility] [[id number]]  Level of Care: Telemetry Medical [[id number]]  May admit patient to [facility] or [facility] if equivalent level of care is available:: Yes  Covid Evaluation: Asymptomatic - no recent exposure (last 10 days) testing not required  Diagnosis: Acute on chronic pancreatitis [facility]) [[id number]]  Admitting Physician: Briscoe Deutscher [[id number]]  Attending Physician: Briscoe Deutscher [[id number]]  Certification:: I certify this patient will need inpatient services for at least 2 midnights  Expected Medical Readiness: 10/29/2023          B Medical/Surgery History Past Medical History:  Diagnosis Date   Acne rosacea    Alcohol abuse    Allergy    Anxiety    Bipolar 1 disorder (HCC)    starting to hear voices   GERD (gastroesophageal reflux disease)    Hypertension    MVP (mitral valve prolapse)    Pancreatitis    Seizures (HCC) 2009   stress induced   Past Surgical History:   Procedure Laterality Date   LAPAROSCOPIC TUBAL LIGATION  11/04/2012   Procedure: LAPAROSCOPIC TUBAL LIGATION;  Surgeon: Lazaro Arms, MD;  Location: [facility];  Service: Gynecology;  Laterality: Bilateral;   LEEP     NASAL SINUS SURGERY       A IV Location/Drains/Wounds Patient Lines/Drains/Airways Status     Active Line/Drains/Airways     Name Placement date Placement time Site Days   Peripheral IV 10/26/23 20 G 1" Left Antecubital 10/26/23  0000  Antecubital  less than 1   Incision 11/04/12 Abdomen Other (Comment) 11/04/12  1216  -- 4008   Incision - 1 Port Abdomen Umbilicus 11/04/12  1216  -- 4008   Wound 08/18/12 Blister (Serous filled);Self-inflicted Other (Comment) Distal blistered pusy area on chin just under lower lip 08/18/12  1712  Other (Comment)  4086            Intake/Output Last 24 hours No intake or output data in the 24 hours ending 10/26/23 0518  Labs/Imaging Results for orders placed or performed during the hospital encounter of 10/25/23 (from the past 48 hour(s))  CBC     Status: Abnormal   Collection Time: 10/25/23  1:36 PM  Result Value Ref Range   WBC 5.3 4.0 - 10.5 K/uL   RBC 3.53 (L) 3.87 - 5.11 MIL/uL   Hemoglobin 10.8 (L) 12.0 - 15.0 g/dL   HCT 10.2 (L) 72.5 - 36.6 %  MCV 92.9 80.0 - 100.0 fL   MCH 30.6 26.0 - 34.0 pg   MCHC 32.9 30.0 - 36.0 g/dL   RDW 16.1 (H) 09.6 - 04.5 %   Platelets 259 150 - 400 K/uL   nRBC 0.0 0.0 - 0.2 %    Comment: Performed at [facility], [address] [place], [place] [address]  Comprehensive metabolic panel     Status: Abnormal   Collection Time: 10/25/23  1:36 PM  Result Value Ref Range   Sodium 132 (L) 135 - 145 mmol/L   Potassium 3.2 (L) 3.5 - 5.1 mmol/L   Chloride 89 (L) 98 - 111 mmol/L   CO2 31 22 - 32 mmol/L   Glucose, Bld 105 (H) 70 - 99 mg/dL    Comment: Glucose reference range applies only to samples taken after fasting for at least 8 hours.   BUN 8 6 - 20 mg/dL   Creatinine, Ser 1.91 0.44 - 1.00  mg/dL   Calcium 7.6 (L) 8.9 - 10.3 mg/dL   Total Protein 6.6 6.5 - 8.1 g/dL   Albumin 2.0 (L) 3.5 - 5.0 g/dL   AST 58 (H) 15 - 41 U/L   ALT 23 0 - 44 U/L   Alkaline Phosphatase 146 (H) 38 - 126 U/L   Total Bilirubin 2.0 (H) 0.3 - 1.2 mg/dL   GFR, Estimated >47 >82 mL/min    Comment: (NOTE) Calculated using the CKD-EPI Creatinine Equation (2021)    Anion gap 12 5 - 15    Comment: Performed at [facility], [address], [place], [place] [address]  Lipase, blood     Status: None   Collection Time: 10/25/23  1:36 PM  Result Value Ref Range   Lipase 19 11 - 51 U/L    Comment: Performed at [facility], [address] [place], [place] [address]  Brain natriuretic peptide     Status: None   Collection Time: 10/25/23  1:36 PM  Result Value Ref Range   B Natriuretic Peptide 90.0 0.0 - 100.0 pg/mL    Comment: Performed at [facility], [address] [place], [place] [address]  Troponin I (High Sensitivity)     Status: None   Collection Time: 10/25/23  1:36 PM  Result Value Ref Range   Troponin I (High Sensitivity) 6 <18 ng/L    Comment: (NOTE) Elevated high sensitivity troponin I (hsTnI) values and significant  changes across serial measurements may suggest ACS but many other  chronic and acute conditions are known to elevate hsTnI results.  Refer to the "Links" section for chest pain algorithms and additional  guidance. Performed at [facility], [address], [place], [place] [address]   D-dimer, quantitative     Status: Abnormal   Collection Time: 10/25/23  1:36 PM  Result Value Ref Range   D-Dimer, Quant 1.09 (H) 0.00 - 0.50 ug/mL-FEU    Comment: (NOTE) At the manufacturer cut-off value of 0.5 g/mL FEU, this assay has a negative predictive value of 95-100%.This assay is intended for use in conjunction with a clinical pretest probability (PTP) assessment model to exclude pulmonary embolism (PE) and deep venous thrombosis (DVT) in outpatients suspected of PE or DVT. Results  should be correlated with clinical presentation. Performed at [facility], [address], [place], [place] [address]   Troponin I (High Sensitivity)     Status: None   Collection Time: 10/25/23  3:21 PM  Result Value Ref Range   Troponin I (High Sensitivity) 7 <  18 ng/L    Comment: (NOTE) Elevated high sensitivity troponin I (hsTnI) values and significant  changes across serial measurements may suggest ACS but many other  chronic and acute conditions are known to elevate hsTnI results.  Refer to the "Links" section for chest pain algorithms and additional  guidance. Performed at [facility], [address] [place], [place] [address]   hCG, serum, qualitative     Status: None   Collection Time: 10/25/23  9:32 PM  Result Value Ref Range   Preg, Serum NEGATIVE NEGATIVE    Comment:        THE SENSITIVITY OF THIS METHODOLOGY IS >10 mIU/mL. Performed at [facility], [address] [place], [place] [address]    CT ABDOMEN PELVIS W CONTRAST  Result Date: 10/26/2023 CLINICAL DATA:  Low to intermediate probability pulmonary embolism, positive D-dimer, unspecified abdominal pain, pancreatitis, peripheral edema. EXAM: CT ANGIOGRAPHY CHEST CT ABDOMEN AND PELVIS WITH CONTRAST TECHNIQUE: Multidetector CT imaging of the chest was performed using the standard protocol during bolus administration of intravenous contrast. Multiplanar CT image reconstructions and MIPs were obtained to evaluate the vascular anatomy. Multidetector CT imaging of the abdomen and pelvis was performed using the standard protocol during bolus administration of intravenous contrast. RADIATION DOSE REDUCTION: This exam was performed according to the departmental dose-optimization program which includes automated exposure control, adjustment of the mA and/or kV according to patient size and/or use of iterative reconstruction technique. CONTRAST:  75mL OMNIPAQUE IOHEXOL 350 MG/ML SOLN COMPARISON:  CT abdomen pelvis 09/10/2021  FINDINGS: CTA CHEST FINDINGS Cardiovascular: Adequate opacification of the pulmonary arterial tree. No intraluminal filling defect identified to suggest acute pulmonary embolism. Central pulmonary arteries are of normal caliber. No significant coronary artery calcification. Cardiac size within normal limits. No pericardial effusion. No significant atherosclerotic calcification within the thoracic aorta. No aortic aneurysm. Mediastinum/Nodes: No enlarged mediastinal, hilar, or axillary lymph nodes. Thyroid gland, trachea, and esophagus demonstrate no significant findings. Lungs/Pleura: Lungs are clear. No pleural effusion or pneumothorax. Musculoskeletal: No acute bone abnormality. Mild pectus excavatum deformity. No lytic or blastic bone lesion. Review of the MIP images confirms the above findings. CT ABDOMEN and PELVIS FINDINGS Hepatobiliary: Severe, heterogeneous hepatic steatosis. No enhancing intrahepatic mass. No intra or extrahepatic biliary ductal dilation. Gallbladder unremarkable. Pancreas: The pancreas is atrophic, progressive since prior examination, with development of multiple peripancreatic fluid collections. 2.9 cm collection adjacent to the pancreatic tail appears more organized with a thin wall, uniform low attenuation of the internal contents and no significant surrounding inflammatory stranding is in keeping with a peripancreatic pseudocyst. More centrally adjacent to the body of the pancreas and just superior to the celiac axis within the gastrohepatic ligament is a more inflammatory collection demonstrating surrounding inflammatory stranding measuring 2.6 x 3.0 cm at axial image # 29/3. This may represent an acute peripancreatic inflammatory collection or reflect superimposed infection of a pre-existing peripancreatic fluid collection. The pancreatic duct is not dilated. No evidence of frank parenchymal necrosis. Spleen: Normal in size.  No intrasplenic lesion identified. Adrenals/Urinary Tract:  Adrenal glands are unremarkable. Kidneys are normal, without renal calculi, focal lesion, or hydronephrosis. Bladder is unremarkable. Stomach/Bowel: Moderate ascites the stomach, small bowel, and large bowel are unremarkable. No evidence of obstruction or focal inflammation. No free intraperitoneal gas. Appendix normal. Vascular/Lymphatic: The splenic vein is chronically thrombosed with numerous gastroepiploic collaterals identified. Superior mesenteric vein and portal vein are patent. The abdominal vasculature is otherwise unremarkable. No pathologic adenopathy within the abdomen and pelvis.  Reproductive: Uterus and bilateral adnexa are unremarkable. Other: No abdominal wall hernia Musculoskeletal: No pathologic adenopathy within the abdomen and pelvis. No lytic or blastic bone lesion. Review of the MIP images confirms the above findings. IMPRESSION: 1. No pulmonary embolism. No acute intrathoracic pathology identified. 2. Progressive pancreatic atrophy with development of multiple peripancreatic fluid collections. 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 More centrally adjacent to the body of the pancreas and just superior to the celiac axis within the gastrohepatic ligament is a more inflammatory collection demonstrating surrounding inflammatory stranding measuring 2.6 x 3.0 cm. This may represent an acute peripancreatic inflammatory collection or reflect superimposed infection of a pre-existing peripancreatic fluid collection. 3. Severe, heterogeneous hepatic steatosis. 4. Moderate ascites. 5. 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 Electronically Signed   By: Helyn Numbers M.D.   On: 10/26/2023 01:49   CT Angio Chest PE W and/or Wo Contrast  Result Date: 10/26/2023 CLINICAL DATA:  Low to intermediate probability pulmonary embolism, positive D-dimer, unspecified abdominal pain, pancreatitis, peripheral edema. EXAM: CT ANGIOGRAPHY CHEST  CT ABDOMEN AND PELVIS WITH CONTRAST TECHNIQUE: Multidetector CT imaging of the chest was performed using the standard protocol during bolus administration of intravenous contrast. Multiplanar CT image reconstructions and MIPs were obtained to evaluate the vascular anatomy. Multidetector CT imaging of the abdomen and pelvis was performed using the standard protocol during bolus administration of intravenous contrast. RADIATION DOSE REDUCTION: This exam was performed according to the departmental dose-optimization program which includes automated exposure control, adjustment of the mA and/or kV according to patient size and/or use of iterative reconstruction technique. CONTRAST:  75mL OMNIPAQUE IOHEXOL 350 MG/ML SOLN COMPARISON:  CT abdomen pelvis 09/10/2021 FINDINGS: CTA CHEST FINDINGS Cardiovascular: Adequate opacification of the pulmonary arterial tree. No intraluminal filling defect identified to suggest acute pulmonary embolism. Central pulmonary arteries are of normal caliber. No significant coronary artery calcification. Cardiac size within normal limits. No pericardial effusion. No significant atherosclerotic calcification within the thoracic aorta. No aortic aneurysm. Mediastinum/Nodes: No enlarged mediastinal, hilar, or axillary lymph nodes. Thyroid gland, trachea, and esophagus demonstrate no significant findings. Lungs/Pleura: Lungs are clear. No pleural effusion or pneumothorax. Musculoskeletal: No acute bone abnormality. Mild pectus excavatum deformity. No lytic or blastic bone lesion. Review of the MIP images confirms the above findings. CT ABDOMEN and PELVIS FINDINGS Hepatobiliary: Severe, heterogeneous hepatic steatosis. No enhancing intrahepatic mass. No intra or extrahepatic biliary ductal dilation. Gallbladder unremarkable. Pancreas: The pancreas is atrophic, progressive since prior examination, with development of multiple peripancreatic fluid collections. 2.9 cm collection adjacent to the  pancreatic tail appears more organized with a thin wall, uniform low attenuation of the internal contents and no significant surrounding inflammatory stranding is in keeping with a peripancreatic pseudocyst. More centrally adjacent to the body of the pancreas and just superior to the celiac axis within the gastrohepatic ligament is a more inflammatory collection demonstrating surrounding inflammatory stranding measuring 2.6 x 3.0 cm at axial image # 29/3. This may represent an acute peripancreatic inflammatory collection or reflect superimposed infection of a pre-existing peripancreatic fluid collection. The pancreatic duct is not dilated. No evidence of frank parenchymal necrosis. Spleen: Normal in size.  No intrasplenic lesion identified. Adrenals/Urinary Tract: Adrenal glands are unremarkable. Kidneys are normal, without renal calculi, focal lesion, or hydronephrosis. Bladder is unremarkable. Stomach/Bowel: Moderate ascites the stomach, small bowel, and large bowel are unremarkable. No evidence of obstruction or focal inflammation. No free intraperitoneal gas. Appendix normal. Vascular/Lymphatic: The splenic vein is  chronically thrombosed with numerous gastroepiploic collaterals identified. Superior mesenteric vein and portal vein are patent. The abdominal vasculature is otherwise unremarkable. No pathologic adenopathy within the abdomen and pelvis. Reproductive: Uterus and bilateral adnexa are unremarkable. Other: No abdominal wall hernia Musculoskeletal: No pathologic adenopathy within the abdomen and pelvis. No lytic or blastic bone lesion. Review of the MIP images confirms the above findings. IMPRESSION: 1. No pulmonary embolism. No acute intrathoracic pathology identified. 2. Progressive pancreatic atrophy with development of multiple peripancreatic fluid collections. 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 More centrally adjacent to the body of the pancreas and just superior to the  celiac axis within the gastrohepatic ligament is a more inflammatory collection demonstrating surrounding inflammatory stranding measuring 2.6 x 3.0 cm. This may represent an acute peripancreatic inflammatory collection or reflect superimposed infection of a pre-existing peripancreatic fluid collection. 3. Severe, heterogeneous hepatic steatosis. 4. Moderate ascites. 5. 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 Electronically Signed   By: Helyn Numbers M.D.   On: 10/26/2023 01:49   DG Chest 2 View  Result Date: 10/25/2023 CLINICAL DATA:  Dyspnea on exertion. Abdominal pain with nausea, vomiting, dizziness, shortness of breath since August or September. EXAM: CHEST - 2 VIEW COMPARISON:  No prior chest radiographs are available. Abdominal CT 09/10/2021. FINDINGS: The heart size and mediastinal contours are. Mild left basilar atelectasis or scarring. The lungs are otherwise clear. There is no effusion or pneumothorax. Moderate pectus deformity of the sternum without acute osseous abnormality. IMPRESSION: Mild left basilar atelectasis or scarring. No acute cardiopulmonary process. Electronically Signed   By: [place] Bullocks M.D.   On: 10/25/2023 13:30    Pending Labs Unresulted Labs (From admission, onward)     Start     Ordered   10/26/23 0500  Comprehensive metabolic panel  Daily,   R      10/26/23 0419   10/26/23 0500  CBC  Daily,   R      10/26/23 0419   10/26/23 0418  HIV Antibody (routine testing w rflx)  (HIV Antibody (Routine testing w reflex) panel)  Once,   R        10/26/23 0419            Vitals/Pain Today's Vitals   10/26/23 0326 10/26/23 0327 10/26/23 0431 10/26/23 0510  BP:  122/83    Pulse:  91    Resp:  16    Temp:  98.2 F (36.8 C)    TempSrc:  Oral    SpO2:  100%    Weight:      Height:      PainSc: 5  5  9  7      Isolation Precautions No active isolations  Medications Medications  sodium chloride flush (NS)  0.9 % injection 3 mL (has no administration in time range)  acetaminophen (TYLENOL) tablet 650 mg (650 mg Oral Given 10/26/23 0448)    Or  acetaminophen (TYLENOL) suppository 650 mg ( Rectal See Alternative 10/26/23 0448)  HYDROmorphone (DILAUDID) injection 0.5 mg (0.5 mg Intravenous Given 10/26/23 0449)  senna-docusate (Senokot-S) tablet 1 tablet (has no administration in time range)  prochlorperazine (COMPAZINE) injection 5 mg (has no administration in time range)  piperacillin-tazobactam (ZOSYN) IVPB 3.375 g (has no administration in time range)  0.9 % NaCl with KCl 20 mEq/ L  infusion (has no administration in time range)  ondansetron (ZOFRAN-ODT) disintegrating tablet 8 mg (8 mg Oral Given 10/25/23 1347)  fentaNYL (SUBLIMAZE)  injection 50 mcg (50 mcg Intravenous Given 10/25/23 1550)  calcium carbonate (TUMS - dosed in mg elemental calcium) chewable tablet 400 mg of elemental calcium (400 mg of elemental calcium Oral Given 10/25/23 1658)  potassium chloride SA (KLOR-CON M) CR tablet 40 mEq (40 mEq Oral Given 10/25/23 1659)  iohexol (OMNIPAQUE) 350 MG/ML injection 75 mL (75 mLs Intravenous Contrast Given 10/26/23 0128)  sodium chloride 0.9 % bolus 1,000 mL (0 mLs Intravenous Stopped 10/26/23 0358)  ondansetron (ZOFRAN) injection 4 mg (4 mg Intravenous Given 10/26/23 0249)  fentaNYL (SUBLIMAZE) injection 50 mcg (50 mcg Intravenous Given 10/26/23 0249)  piperacillin-tazobactam (ZOSYN) IVPB 3.375 g (0 g Intravenous Stopped 10/26/23 0328)  HYDROmorphone (DILAUDID) injection 0.5 mg (0.5 mg Intravenous Given 10/26/23 0358)    Mobility walks     Focused Assessments Cardiac Assessment Handoff:    No results found for: "CKTOTAL", "CKMB", "CKMBINDEX", "TROPONINI" Lab Results  Component Value Date   DDIMER 1.09 (H) 10/25/2023   Does the Patient currently have chest pain? No    R Recommendations: See Admitting Provider Note  Report given to:   Additional Notes: USIV 20 Left, d-dimer elevated,  moderate asites, pancreatic atrophy, cooperative

## 2023-10-26 NOTE — Consult Note (Signed)
Brittany [name] 1976-06-13  [id number].    Requesting MD: Brittany [name] Chief Complaint/Reason for Consult: pancreatitis  HPI:  Brittany [name] is a 47 yo female with a history of pancreatitis with chronic splenic vein occlusion and previous alcohol abuse. She has chronic nausea, early satiety, and abdominal pain, but her symptoms have recently acutely worsened. She has had increased pain in the last few days, primarily in the epigastric area with radiation around the side to the back. She says it has become unbearable, which prompted her to present to the ED. She has also had difficulty with PO intake, and has early satiety after eating small amounts. She says she has not been able to drink protein shakes as they usually cause her to vomit. Labs are significant for a Tbili of 2.0 (bilirubin was previously normal on labs in 2022). WBC and lipase are normal. A CT scan showed two new peripancreatic fluid collections, as well as some perihepatic ascites. General surgery was consulted.  The patient denies any recent EtOH use and does not currently smoke.   ROS: Review of Systems  Constitutional:  Negative for chills and fever.  Respiratory:  Positive for shortness of breath.   Gastrointestinal:  Positive for abdominal pain, nausea and vomiting.    Family History  Problem Relation Age of Onset   Hypertension Mother    Colon cancer Neg Hx    Colon polyps Neg Hx    Esophageal cancer Neg Hx    Rectal cancer Neg Hx    Stomach cancer Neg Hx     Past Medical History:  Diagnosis Date   Acne rosacea    Alcohol abuse    Allergy    Anxiety    Bipolar 1 disorder (HCC)    starting to hear voices   GERD (gastroesophageal reflux disease)    Hypertension    MVP (mitral valve prolapse)    Pancreatitis    Seizures (HCC) 2009   stress induced    Past Surgical History:  Procedure Laterality Date   LAPAROSCOPIC TUBAL LIGATION  11/04/2012   Procedure: LAPAROSCOPIC TUBAL LIGATION;  Surgeon:  Brittany Arms, MD;  Location: [facility];  Service: Gynecology;  Laterality: Bilateral;   LEEP     NASAL SINUS SURGERY      Social History:  reports that she quit smoking about 6 years ago. Her smoking use included cigarettes. She started smoking about 16 years ago. She has never used smokeless tobacco. She reports that she does not currently use alcohol. She reports that she does not use drugs.  Allergies:  Allergies  Allergen Reactions   Vicoprofen [Hydrocodone-Ibuprofen] Nausea Only   Doxycycline Nausea And Vomiting   Morphine And Codeine Itching    (Not in a hospital admission)    Physical Exam: Blood pressure 122/83, pulse 91, temperature 98.2 F (36.8 C), temperature source Oral, resp. rate 16, height 5\' 4"  (1.626 m), weight 69.4 kg, last menstrual period 08/24/2023, SpO2 100%. General: resting comfortably, appears stated age, no apparent distress Neurological: alert and oriented, no focal deficits HEENT: normocephalic, atraumatic CV: regular rate and rhythm Respiratory: normal work of breathing on room air Abdomen: soft, nondistended, mild epigastric tenderness to palpation. Extremities: warm and well-perfused, no deformities, moving all extremities spontaneously Psychiatric: normal mood and affect Skin: warm and dry, no jaundice, no rashes or lesions    Assessment/Plan 47 yo female with a history of pancreatitis likely secondary to EtOH, presenting with acutely worsening abdominal pain, nausea and vomiting. I  personally reviewed her labs, imaging and notes. She has two new peripancreatic fluid collections, one in the tail and one adjacent to the body of the pancreas, with significant atrophy of the gland, however there is no gas within the collections to suggest a superimposed infection. There are venous collaterals in the upper abdomen secondary to chronic splenic vein occlusion. The peripancreatic collections likely represent developing pseudocysts, however in the absence of  gas in the collections, leukocytosis and fevers to suggest infection, there is no indication for antibiotics for pancreatitis or the collections at this time. The collections are not large enough to cause mass effect that would contribute to her chronic nausea and early satiety. Thus I do not recommend any attempts of drainage of these collections at this time, especially as they do not appear to have matured yet into walled-off pseudocysts. - Treat with supportive care including hydration and pain/nausea control. - Monitor oral intake and consider placement of a nasojejunal feeding tube to supplement nutrition if PO intake is inadequate. Nutrition consult placed. - Recommend a GI consult. Patient has previously followed with Brittany [name]. - Trend LFTs. There are no signs of biliary obstruction on imaging, and given rising bilirubin, ascites and steatosis, elevated bili could be from underlying liver disease. - No indication for acute surgical intervention.   Brittany Simas, MD [facility] Surgery General, Hepatobiliary and Pancreatic Surgery 10/26/23 5:39 AM

## 2023-10-26 NOTE — Progress Notes (Signed)
Pharmacy Antibiotic Note  Brittany [name] is a 47 y.o. female admitted on 10/25/2023 with concern for intra abdominal infection. CT imaging with concern for pancreatic pseudocysts with inflammatory stranding noted. Pharmacy has been consulted for zosyn dosing.  Plan: Zosyn 3.375g q8h  F/u renal function, infectious work up and narrow as able  Height: 5\' 4"  (162.6 cm) Weight: 69.4 kg (153 lb) IBW/kg (Calculated) : 54.7  Temp (24hrs), Avg:98.4 F (36.9 C), Min:97.9 F (36.6 C), Max:99.1 F (37.3 C)  Recent Labs  Lab 10/25/23 1336  WBC 5.3  CREATININE 0.64    Estimated Creatinine Clearance: 84.1 mL/min (by C-G formula based on SCr of 0.64 mg/dL).    Allergies  Allergen Reactions   Vicoprofen [Hydrocodone-Ibuprofen] Nausea Only   Doxycycline Nausea And Vomiting   Morphine And Codeine Itching    Antimicrobials this admission: Zosyn 11/3 >  Thank you for allowing pharmacy to be a part of this patient's care.  Marja Kays 10/26/2023 4:53 AM

## 2023-10-26 NOTE — H&P (Addendum)
History and Physical    Brittany [name] DOB: January 13, 1976 DOA: 10/25/2023  PCP: Babs Sciara, MD   Patient coming from: Home   Chief Complaint: Abdominal pain, N/V   HPI: Brittany [name] is a 47 y.o. 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  Patient reports 2 to 3 months of intermittent epigastric pain with nausea and nonbloody vomiting.  The symptoms have worsened in recent days and she states that she is unable to tolerate more than a couple bites of food without vomiting.  She also feels bloated and has developed bilateral lower extremity edema.  She denies any fevers, hematemesis, melena, or hematochezia.  She denies any recent alcohol use.  ED Course: Upon arrival to the ED, patient is found to be afebrile and saturating well on room air with transient tachycardia and stable blood pressure.  Labs are most notable for sodium 132, potassium 3.2, albumin 2.0, bilirubin 2.0, and normal WBC.  CTA chest is negative for PE or other acute findings.  CT of the abdomen and pelvis demonstrates severe hepatic steatosis, chronic thrombosis of splenic vein, and progressive pancreatic atrophy with development of multiple peripancreatic fluid collections including one with surrounding inflammatory stranding.   General surgery consulted by the ED physician and the patient was treated with fentanyl x 2, Dilaudid, Zofran x 2, 1 L of NS, oral potassium, and Zosyn.  Review of Systems:  All other systems reviewed and apart from HPI, are negative.  Past Medical History:  Diagnosis Date   Acne rosacea    Alcohol abuse    Allergy    Anxiety    Bipolar 1 disorder (HCC)    starting to hear voices   GERD (gastroesophageal reflux disease)     Hypertension    MVP (mitral valve prolapse)    Pancreatitis    Seizures (HCC) 2009   stress induced    Past Surgical History:  Procedure Laterality Date   LAPAROSCOPIC TUBAL LIGATION  11/04/2012   Procedure: LAPAROSCOPIC TUBAL LIGATION;  Surgeon: Lazaro Arms, MD;  Location: [facility];  Service: Gynecology;  Laterality: Bilateral;   LEEP     NASAL SINUS SURGERY      Social History:   reports that she quit smoking about 6 years ago. Her smoking use included cigarettes. She started smoking about 16 years ago. She has never used smokeless tobacco. She reports that she does not currently use alcohol. She reports that she does not use drugs.  Allergies  Allergen Reactions   Vicoprofen [Hydrocodone-Ibuprofen] Nausea Only   Doxycycline Nausea And Vomiting   Morphine And Codeine Itching    Family History  Problem Relation Age of Onset   Hypertension Mother    Colon cancer Neg Hx    Colon polyps Neg Hx    Esophageal cancer Neg Hx    Rectal cancer Neg Hx    Stomach cancer Neg Hx      Prior to Admission medications   Medication Sig Start Date End Date Taking? Authorizing Provider  acetaminophen (TYLENOL) 500 MG tablet Take 500 mg by mouth every 6 (six) hours as needed for moderate pain.    [provider]  clobetasol ointment (TEMOVATE) 0.05 % Apply 1 Application topically 2 (two) times daily. 02/21/23   Tommie Sams, DO  hydrOXYzine (VISTARIL) 25 MG  capsule Take 1 capsule (25 mg total) by mouth every 8 (eight) hours as needed for itching. 02/21/23   Tommie Sams, DO  omeprazole (PRILOSEC) 40 MG capsule Take 1 capsule by mouth once daily 08/20/23   Luking, Scott A, MD  ondansetron (ZOFRAN) 4 MG tablet TAKE 1 TABLET BY MOUTH EVERY 8 HOURS AS NEEDED FOR NAUSEA OR VOMITING 08/21/23   Luking, Jonna Coup, MD  SUBOXONE 8-2 MG FILM Place 2 Film under the tongue 2 (two) times daily. 04/22/18   [provider]    Physical Exam: Vitals:   10/26/23 0230 10/26/23 0245 10/26/23 0300  10/26/23 0327  BP: 104/72 (!) 121/91 (!) 129/101 122/83  Pulse: 90 94 (!) 107 91  Resp:    16  Temp:    98.2 F (36.8 C)  TempSrc:    Oral  SpO2: 98% 100% 100% 100%  Weight:      Height:        Constitutional: NAD, calm  Eyes: PERTLA, lids and conjunctivae normal ENMT: Mucous membranes are moist. Posterior pharynx clear of any exudate or lesions.   Neck: supple, no masses  Respiratory: no wheezing, no crackles. No accessory muscle use.  Cardiovascular: S1 & S2 heard, regular rate and rhythm. Pretibial pitting edema b/l.   Abdomen: Soft, tender in epigastrium. Bowel sounds active.  Musculoskeletal: no clubbing / cyanosis. No joint deformity upper and lower extremities.   Skin: no significant rashes, lesions, ulcers. Warm, dry, well-perfused. Neurologic: CN 2-12 grossly intact. Moving all extremities. Alert and oriented.  Psychiatric: Calm. Cooperative.    Labs and Imaging on Admission: I have personally reviewed following labs and imaging studies  CBC: Recent Labs  Lab 10/25/23 1336  WBC 5.3  HGB 10.8*  HCT 32.8*  MCV 92.9  PLT 259   Basic Metabolic Panel: Recent Labs  Lab 10/25/23 1336  NA 132*  K 3.2*  CL 89*  CO2 31  GLUCOSE 105*  BUN 8  CREATININE 0.64  CALCIUM 7.6*   GFR: Estimated Creatinine Clearance: 84.1 mL/min (by C-G formula based on SCr of 0.64 mg/dL). Liver Function Tests: Recent Labs  Lab 10/25/23 1336  AST 58*  ALT 23  ALKPHOS 146*  BILITOT 2.0*  PROT 6.6  ALBUMIN 2.0*   Recent Labs  Lab 10/25/23 1336  LIPASE 19   No results for input(s): "AMMONIA" in the last 168 hours. Coagulation Profile: No results for input(s): "INR", "PROTIME" in the last 168 hours. Cardiac Enzymes: No results for input(s): "CKTOTAL", "CKMB", "CKMBINDEX", "TROPONINI" in the last 168 hours. BNP (last 3 results) No results for input(s): "PROBNP" in the last 8760 hours. HbA1C: No results for input(s): "HGBA1C" in the last 72 hours. CBG: No results for  input(s): "GLUCAP" in the last 168 hours. Lipid Profile: No results for input(s): "CHOL", "HDL", "LDLCALC", "TRIG", "CHOLHDL", "LDLDIRECT" in the last 72 hours. Thyroid Function Tests: No results for input(s): "TSH", "T4TOTAL", "FREET4", "T3FREE", "THYROIDAB" in the last 72 hours. Anemia Panel: No results for input(s): "VITAMINB12", "FOLATE", "FERRITIN", "TIBC", "IRON", "RETICCTPCT" in the last 72 hours. Urine analysis:    Component Value Date/Time   COLORURINE AMBER (A) 09/10/2021 1028   APPEARANCEUR HAZY (A) 09/10/2021 1028   LABSPEC 1.026 09/10/2021 1028   PHURINE 6.0 09/10/2021 1028   GLUCOSEU NEGATIVE 09/10/2021 1028   HGBUR SMALL (A) 09/10/2021 1028   BILIRUBINUR SMALL (A) 09/10/2021 1028   KETONESUR 5 (A) 09/10/2021 1028   PROTEINUR 30 (A) 09/10/2021 1028   NITRITE NEGATIVE 09/10/2021 1028  LEUKOCYTESUR NEGATIVE 09/10/2021 1028   Sepsis Labs: @LABRCNTIP (procalcitonin:4,lacticidven:4) )No results found for this or any previous visit (from the past 240 hour(s)).   Radiological Exams on Admission: CT ABDOMEN PELVIS W CONTRAST  Result Date: 10/26/2023 CLINICAL DATA:  Low to intermediate probability pulmonary embolism, positive D-dimer, unspecified abdominal pain, pancreatitis, peripheral edema. EXAM: CT ANGIOGRAPHY CHEST CT ABDOMEN AND PELVIS WITH CONTRAST TECHNIQUE: Multidetector CT imaging of the chest was performed using the standard protocol during bolus administration of intravenous contrast. Multiplanar CT image reconstructions and MIPs were obtained to evaluate the vascular anatomy. Multidetector CT imaging of the abdomen and pelvis was performed using the standard protocol during bolus administration of intravenous contrast. RADIATION DOSE REDUCTION: This exam was performed according to the departmental dose-optimization program which includes automated exposure control, adjustment of the mA and/or kV according to patient size and/or use of iterative reconstruction technique.  CONTRAST:  75mL OMNIPAQUE IOHEXOL 350 MG/ML SOLN COMPARISON:  CT abdomen pelvis 09/10/2021 FINDINGS: CTA CHEST FINDINGS Cardiovascular: Adequate opacification of the pulmonary arterial tree. No intraluminal filling defect identified to suggest acute pulmonary embolism. Central pulmonary arteries are of normal caliber. No significant coronary artery calcification. Cardiac size within normal limits. No pericardial effusion. No significant atherosclerotic calcification within the thoracic aorta. No aortic aneurysm. Mediastinum/Nodes: No enlarged mediastinal, hilar, or axillary lymph nodes. Thyroid gland, trachea, and esophagus demonstrate no significant findings. Lungs/Pleura: Lungs are clear. No pleural effusion or pneumothorax. Musculoskeletal: No acute bone abnormality. Mild pectus excavatum deformity. No lytic or blastic bone lesion. Review of the MIP images confirms the above findings. CT ABDOMEN and PELVIS FINDINGS Hepatobiliary: Severe, heterogeneous hepatic steatosis. No enhancing intrahepatic mass. No intra or extrahepatic biliary ductal dilation. Gallbladder unremarkable. Pancreas: The pancreas is atrophic, progressive since prior examination, with development of multiple peripancreatic fluid collections. 2.9 cm collection adjacent to the pancreatic tail appears more organized with a thin wall, uniform low attenuation of the internal contents and no significant surrounding inflammatory stranding is in keeping with a peripancreatic pseudocyst. More centrally adjacent to the body of the pancreas and just superior to the celiac axis within the gastrohepatic ligament is a more inflammatory collection demonstrating surrounding inflammatory stranding measuring 2.6 x 3.0 cm at axial image # 29/3. This may represent an acute peripancreatic inflammatory collection or reflect superimposed infection of a pre-existing peripancreatic fluid collection. The pancreatic duct is not dilated. No evidence of frank parenchymal  necrosis. Spleen: Normal in size.  No intrasplenic lesion identified. Adrenals/Urinary Tract: Adrenal glands are unremarkable. Kidneys are normal, without renal calculi, focal lesion, or hydronephrosis. Bladder is unremarkable. Stomach/Bowel: Moderate ascites the stomach, small bowel, and large bowel are unremarkable. No evidence of obstruction or focal inflammation. No free intraperitoneal gas. Appendix normal. Vascular/Lymphatic: The splenic vein is chronically thrombosed with numerous gastroepiploic collaterals identified. Superior mesenteric vein and portal vein are patent. The abdominal vasculature is otherwise unremarkable. No pathologic adenopathy within the abdomen and pelvis. Reproductive: Uterus and bilateral adnexa are unremarkable. Other: No abdominal wall hernia Musculoskeletal: No pathologic adenopathy within the abdomen and pelvis. No lytic or blastic bone lesion. Review of the MIP images confirms the above findings. IMPRESSION: 1. No pulmonary embolism. No acute intrathoracic pathology identified. 2. Progressive pancreatic atrophy with development of multiple peripancreatic fluid collections. 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 More centrally adjacent to the body of the pancreas and just superior to the celiac axis within the gastrohepatic ligament is a more inflammatory collection demonstrating surrounding inflammatory stranding measuring 2.6 x 3.0  cm. This may represent an acute peripancreatic inflammatory collection or reflect superimposed infection of a pre-existing peripancreatic fluid collection. 3. Severe, heterogeneous hepatic steatosis. 4. Moderate ascites. 5. 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 Electronically Signed   By: Helyn Numbers M.D.   On: 10/26/2023 01:49   CT Angio Chest PE W and/or Wo Contrast  Result Date: 10/26/2023 CLINICAL DATA:  Low to intermediate probability pulmonary embolism, positive  D-dimer, unspecified abdominal pain, pancreatitis, peripheral edema. EXAM: CT ANGIOGRAPHY CHEST CT ABDOMEN AND PELVIS WITH CONTRAST TECHNIQUE: Multidetector CT imaging of the chest was performed using the standard protocol during bolus administration of intravenous contrast. Multiplanar CT image reconstructions and MIPs were obtained to evaluate the vascular anatomy. Multidetector CT imaging of the abdomen and pelvis was performed using the standard protocol during bolus administration of intravenous contrast. RADIATION DOSE REDUCTION: This exam was performed according to the departmental dose-optimization program which includes automated exposure control, adjustment of the mA and/or kV according to patient size and/or use of iterative reconstruction technique. CONTRAST:  75mL OMNIPAQUE IOHEXOL 350 MG/ML SOLN COMPARISON:  CT abdomen pelvis 09/10/2021 FINDINGS: CTA CHEST FINDINGS Cardiovascular: Adequate opacification of the pulmonary arterial tree. No intraluminal filling defect identified to suggest acute pulmonary embolism. Central pulmonary arteries are of normal caliber. No significant coronary artery calcification. Cardiac size within normal limits. No pericardial effusion. No significant atherosclerotic calcification within the thoracic aorta. No aortic aneurysm. Mediastinum/Nodes: No enlarged mediastinal, hilar, or axillary lymph nodes. Thyroid gland, trachea, and esophagus demonstrate no significant findings. Lungs/Pleura: Lungs are clear. No pleural effusion or pneumothorax. Musculoskeletal: No acute bone abnormality. Mild pectus excavatum deformity. No lytic or blastic bone lesion. Review of the MIP images confirms the above findings. CT ABDOMEN and PELVIS FINDINGS Hepatobiliary: Severe, heterogeneous hepatic steatosis. No enhancing intrahepatic mass. No intra or extrahepatic biliary ductal dilation. Gallbladder unremarkable. Pancreas: The pancreas is atrophic, progressive since prior examination, with  development of multiple peripancreatic fluid collections. 2.9 cm collection adjacent to the pancreatic tail appears more organized with a thin wall, uniform low attenuation of the internal contents and no significant surrounding inflammatory stranding is in keeping with a peripancreatic pseudocyst. More centrally adjacent to the body of the pancreas and just superior to the celiac axis within the gastrohepatic ligament is a more inflammatory collection demonstrating surrounding inflammatory stranding measuring 2.6 x 3.0 cm at axial image # 29/3. This may represent an acute peripancreatic inflammatory collection or reflect superimposed infection of a pre-existing peripancreatic fluid collection. The pancreatic duct is not dilated. No evidence of frank parenchymal necrosis. Spleen: Normal in size.  No intrasplenic lesion identified. Adrenals/Urinary Tract: Adrenal glands are unremarkable. Kidneys are normal, without renal calculi, focal lesion, or hydronephrosis. Bladder is unremarkable. Stomach/Bowel: Moderate ascites the stomach, small bowel, and large bowel are unremarkable. No evidence of obstruction or focal inflammation. No free intraperitoneal gas. Appendix normal. Vascular/Lymphatic: The splenic vein is chronically thrombosed with numerous gastroepiploic collaterals identified. Superior mesenteric vein and portal vein are patent. The abdominal vasculature is otherwise unremarkable. No pathologic adenopathy within the abdomen and pelvis. Reproductive: Uterus and bilateral adnexa are unremarkable. Other: No abdominal wall hernia Musculoskeletal: No pathologic adenopathy within the abdomen and pelvis. No lytic or blastic bone lesion. Review of the MIP images confirms the above findings. IMPRESSION: 1. No pulmonary embolism. No acute intrathoracic pathology identified. 2. Progressive pancreatic atrophy with development of multiple peripancreatic fluid collections. 2.9 cm peripancreatic pseudocyst along the tail  the pancreas. More centrally adjacent  to the body of the pancreas and just superior to the celiac axis within the gastrohepatic ligament is a more inflammatory collection demonstrating surrounding inflammatory stranding measuring 2.6 x 3.0 cm. This may represent an acute peripancreatic inflammatory collection or reflect superimposed infection of a pre-existing peripancreatic fluid collection. 3. Severe, heterogeneous hepatic steatosis. 4. Moderate ascites. 5. 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 Electronically Signed   By: Helyn Numbers M.D.   On: 10/26/2023 01:49   DG Chest 2 View  Result Date: 10/25/2023 CLINICAL DATA:  Dyspnea on exertion. Abdominal pain with nausea, vomiting, dizziness, shortness of breath since August or September. EXAM: CHEST - 2 VIEW COMPARISON:  No prior chest radiographs are available. Abdominal CT 09/10/2021. FINDINGS: The heart size and mediastinal contours are. Mild left basilar atelectasis or scarring. The lungs are otherwise clear. There is no effusion or pneumothorax. Moderate pectus deformity of the sternum without acute osseous abnormality. IMPRESSION: Mild left basilar atelectasis or scarring. No acute cardiopulmonary process. Electronically Signed   By: Carey Bullocks M.D.   On: 10/25/2023 13:30    EKG: Independently reviewed. Sinus rhythm, QTc 510 ms.   Assessment/Plan   1. Infected pancreatic pseudocyst  - Worsening epigastric pain and N/V in pt with hx of alcoholic pancreatitis; found to have pseudocysts and surrounding inflammatory changes concerning for possible infection   - She was given IVF, analgesics, antiemetics, and started on Zosyn in ED  - Continue bowel rest, IVF hydration, pain-control, and Zosyn, follow-up surgery recommendations    2. Elevated LFTs  - Slight elevation in alk phos and AST appear chronic, mild hyperbilirubinemia appears new   - CT notable for severe steatosis but no intra- or  extrahepatic ductal dilatation on CT in ED  - Fractionate bilirubin, trend   3. Hypokalemia  - Replacing   4. Prolonged QT interval  - QTc 510 in ED  - Replace potassium, check magnesium level, avoid QT-prolonging medications      DVT prophylaxis: SCDs  Code Status: Full  Level of Care: Level of care: Telemetry Medical Family Communication: None present   Disposition Plan:  Patient is from: home  Anticipated d/c is to: Home  Anticipated d/c date is: 10/29/23  Patient currently: Pending pain-control, tolerance of adequate oral intake, surgery consultation  Consults called: Surgery  Admission status: Inpatient    Briscoe Deutscher, MD [facility]  10/26/2023, 4:19 AM

## 2023-10-27 LAB — COMPREHENSIVE METABOLIC PANEL
ALT: 21 U/L (ref 0–44)
AST: 43 U/L — ABNORMAL HIGH (ref 15–41)
Albumin: 1.6 g/dL — ABNORMAL LOW (ref 3.5–5.0)
Alkaline Phosphatase: 119 U/L (ref 38–126)
Anion gap: 12 (ref 5–15)
BUN: 6 mg/dL (ref 6–20)
CO2: 21 mmol/L — ABNORMAL LOW (ref 22–32)
Calcium: 7.4 mg/dL — ABNORMAL LOW (ref 8.9–10.3)
Chloride: 103 mmol/L (ref 98–111)
Creatinine, Ser: 0.66 mg/dL (ref 0.44–1.00)
GFR, Estimated: >60 mL/min (ref >60)
Glucose, Bld: 57 mg/dL — ABNORMAL LOW (ref 70–99)
Potassium: 3.7 mmol/L (ref 3.5–5.1)
Sodium: 136 mmol/L (ref 135–145)
Total Bilirubin: 1.7 mg/dL — ABNORMAL HIGH (ref <1.2)
Total Protein: 5.6 g/dL — ABNORMAL LOW (ref 6.5–8.1)

## 2023-10-27 LAB — CBC
HCT: 30.1 % — ABNORMAL LOW (ref 36.0–46.0)
Hemoglobin: 9.6 g/dL — ABNORMAL LOW (ref 12.0–15.0)
MCH: 30.3 pg (ref 26.0–34.0)
MCHC: 31.9 g/dL (ref 30.0–36.0)
MCV: 95 fL (ref 80.0–100.0)
Platelets: 202 10*3/uL (ref 150–400)
RBC: 3.17 MIL/uL — ABNORMAL LOW (ref 3.87–5.11)
RDW: 16.8 % — ABNORMAL HIGH (ref 11.5–15.5)
WBC: 2.6 10*3/uL — ABNORMAL LOW (ref 4.0–10.5)
nRBC: 0 % (ref 0.0–0.2)

## 2023-10-27 MED ORDER — ADULT MULTIVITAMIN W/MINERALS CH
1.0000 | ORAL_TABLET | Freq: Every day | ORAL | Status: DC
  Administered 2023-10-27 – 2023-10-30 (×4): 1 via ORAL
  Filled 2023-10-27 (×4): qty 1

## 2023-10-27 MED ORDER — PANTOPRAZOLE SODIUM 40 MG IV SOLR
40.0000 mg | Freq: Every day | INTRAVENOUS | Status: DC
  Administered 2023-10-27 – 2023-10-28 (×2): 40 mg via INTRAVENOUS
  Filled 2023-10-27 (×2): qty 10

## 2023-10-27 MED ORDER — INFLUENZA VIRUS VACC SPLIT PF (FLUZONE) 0.5 ML IM SUSY
0.5000 mL | PREFILLED_SYRINGE | INTRAMUSCULAR | Status: AC
  Administered 2023-10-28: 0.5 mL via INTRAMUSCULAR
  Filled 2023-10-27: qty 0.5

## 2023-10-27 MED ORDER — BOOST / RESOURCE BREEZE PO LIQD CUSTOM
1.0000 | Freq: Three times a day (TID) | ORAL | Status: DC
  Administered 2023-10-27 – 2023-10-30 (×5): 1 via ORAL

## 2023-10-27 MED ORDER — ENOXAPARIN SODIUM 40 MG/0.4ML IJ SOSY
40.0000 mg | PREFILLED_SYRINGE | INTRAMUSCULAR | Status: DC
  Administered 2023-10-27 – 2023-10-29 (×3): 40 mg via SUBCUTANEOUS
  Filled 2023-10-27 (×3): qty 0.4

## 2023-10-27 MED ORDER — B COMPLEX-C PO TABS
1.0000 | ORAL_TABLET | Freq: Every day | ORAL | Status: DC
  Administered 2023-10-27 – 2023-10-30 (×4): 1 via ORAL
  Filled 2023-10-27 (×4): qty 1

## 2023-10-27 NOTE — Plan of Care (Signed)

## 2023-10-27 NOTE — Progress Notes (Addendum)
Initial Nutrition Assessment  DOCUMENTATION CODES:   Non-severe (moderate) malnutrition in context of chronic illness  INTERVENTION:   - Boost breeze TID,  each supplement provides 250 kcal and 9 grams of protein  - Chocolate Mighty shake BID, each supplement provides 330 kcals and 9 grams of protein  - Provide MVI with minerals  - Provide B complex with vitamin C   - Recommend checking vitamins A,D,E,K   NUTRITION DIAGNOSIS:   Moderate Malnutrition related to chronic illness ((Pancreatitis)) as evidenced by energy intake < or equal to 75% for > or equal to 1 month, mild fat depletion, mild muscle depletion.  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 Admitted 10/25/23 with abdominal pain, nausea, and vomiting.   Pt states having issues with PO intake and appetite 2-3 months ago. During this timeframe, She only consumed 1-2 small snacks such as fruit, cheese roll up, or broth throughout the day because of feeling full. She reports having nausea, vomiting, and pain when she would eat.   Pt has lost 14 lbs in 8 months,this is not clinically significant per timeframe but Is concerning given the overall clinical picture.   Pt still reports some pain in abdomen but no nausea or vomiting most likely because of being NPO and on nausea medication. Pt states she has only had a handful of pretzels and popsicles since Friday. On visit MD prescribed a full liquid diet. Pt weary of protein shakes because of an emesis episode recently after drinking one. Pt open to trying Boost Breeze between meals and chocolate Mighty shakes with lunch and dinner.   Admit  weight: 69.4 kg Current weight: 68.9 kg   10/27/23 68.9 kg  02/28/23 75.3 kg  02/21/23 76.7 kg   Average Meal Intake: Just prescribed full liquid diet  Nutritionally Relevant Medications: Scheduled Meds:  [START ON 10/28/2023] influenza vac split trivalent PF  0.5 mL Intramuscular Tomorrow-1000   sodium chloride flush  3 mL Intravenous Q12H   Continuous Infusions: PRN Meds:.acetaminophen **OR** acetaminophen, HYDROmorphone (DILAUDID) injection, mouth rinse, prochlorperazine, senna-docusate  Labs Reviewed: Calcium 7.4 CBG ranges from 57-82 mg/dL over the last 24 hours  NUTRITION - FOCUSED PHYSICAL EXAM:  Flowsheet Row Most Recent Value  Orbital Region Mild depletion  Upper Arm Region Mild depletion  Thoracic and Lumbar Region Mild depletion  Buccal Region No depletion  Temple Region Moderate depletion  Clavicle Bone Region Mild depletion  Clavicle and Acromion Bone Region Mild depletion  Scapular Bone Region Mild depletion  Dorsal Hand Mild depletion  Patellar Region Mild depletion  Anterior Thigh Region Mild depletion  Posterior Calf Region Mild depletion  Edema (RD Assessment) Mild  Hair Reviewed  Eyes Reviewed  Mouth Reviewed  Skin Reviewed  Nails Reviewed       Diet Order:   Diet Order             Diet full liquid Fluid consistency: Thin  Diet effective now                  EDUCATION NEEDS:   No education needs have been identified at this time  Skin:  Skin Assessment: Reviewed RN Assessment  Last BM:  PTA  Height:   Ht Readings from Last 1 Encounters:  10/25/23 5\' 4"  (1.626 m)    Weight:   Wt Readings from Last 1 Encounters:  10/27/23 68.9 kg   BMI:  Body mass index is 26.07 kg/m.  Estimated Nutritional Needs:   Kcal:  2100-2300  Protein:  90-110g  Fluid:  >2.1L    Elliot Dally, RD Registered Dietitian  See Amion for more information

## 2023-10-27 NOTE — Progress Notes (Signed)
PROGRESS NOTE    Brittany [name]  NGE:[id number] DOB: 1976/09/01 DOA: 10/25/2023 PCP: Babs Sciara, MD   Brief Narrative:  Brittany [name] is a 47 y.o. 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   Assessment & Plan:   Principal Problem:   Infected pancreatic pseudocyst Active Problems:   Hypokalemia   Prolonged QT interval   Elevated LFTs  Pancreatitis, recurrent, POA pancreatic pseudocyst  - Worsening epigastric pain and N/V in pt with hx of alcoholic pancreatitis; found to have pseudocysts and surrounding inflammatory changes concerning for possible infection -General Surgery following, appreciate insight recommendations -Continue supportive care, IV fluids -discontinue antibiotics per discussion with surgery -no signs or symptoms of infection at this time -No recommendations to drain at this time -Pain continues to improve - trial of diet today - advance as tolerated.   Elevated LFTs, resolving -Likely secondary to above -no signs or symptoms of biliary obstruction, continue to monitor  Hypokalemia  -Replace, follow labs   Prolonged QT interval  - QTc 510 in ED  -Limit QT prolonging medications  DVT prophylaxis: SCDs Start: 10/26/23 0418   Code Status:   Code Status: Full Code  Family Communication: None present  Status is: Inpatient  Dispo: The patient is from: Home              Anticipated d/c is to: Home              Anticipated d/c date is: 24-48 hours              Patient currently not medically stable for discharge  Consultants:  Surgery  Procedures:  None  Antimicrobials:  None   Subjective: No acute issues or events overnight currently well-controlled as is nausea vomiting  Objective: Vitals:    10/26/23 2100 10/27/23 0500 10/27/23 0600 10/27/23 0758  BP: (!) 121/91  108/88 108/77  Pulse: 94  88 87  Resp:    16  Temp: 98.5 F (36.9 C)  98.3 F (36.8 C) 98.1 F (36.7 C)  TempSrc: Oral  Oral Oral  SpO2: 100%  100% 100%  Weight:  68.9 kg    Height:        Intake/Output Summary (Last 24 hours) at 10/27/2023 0801 Last data filed at 10/26/2023 2200 Gross per 24 hour  Intake 527.22 ml  Output --  Net 527.22 ml   Filed Weights   10/25/23 1235 10/26/23 0500 10/27/23 0500  Weight: 69.4 kg 66.2 kg 68.9 kg    Examination:  General:  Pleasantly resting in bed, No acute distress. HEENT:  Normocephalic atraumatic.  Sclerae nonicteric, noninjected.  Extraocular movements intact bilaterally. Neck:  Without mass or deformity.  Trachea is midline. Lungs:  Clear to auscultate bilaterally without rhonchi, wheeze, or rales. Heart:  Regular rate and rhythm.  Without murmurs, rubs, or gallops. Abdomen: Soft, diffusely tender, PMI right upper quadrant, nondistended.  Without guarding or rebound. Extremities: Without cyanosis, clubbing, edema, or obvious deformity. Skin:  Warm and dry, no erythema.  Data Reviewed: I have personally reviewed following labs and imaging studies  CBC: Recent Labs  Lab 10/25/23 1336 10/26/23 0508 10/27/23 0356  WBC 5.3 3.2* 2.6*  HGB 10.8* 9.2* 9.6*  HCT 32.8* 28.1* 30.1*  MCV 92.9 92.7 95.0  PLT 259 199 202   Basic Metabolic Panel:  Recent Labs  Lab 10/25/23 1336 10/26/23 0508 10/27/23 0356  NA 132* 132* 136  K 3.2* 4.0 3.7  CL 89* 94* 103  CO2 31 27 21*  GLUCOSE 105* 82 57*  BUN 8 6 6   CREATININE 0.64 0.68 0.66  CALCIUM 7.6* 7.2* 7.4*   GFR: Estimated Creatinine Clearance: 83.8 mL/min (by C-G formula based on SCr of 0.66 mg/dL). Liver Function Tests: Recent Labs  Lab 10/25/23 1336 10/26/23 0508 10/27/23 0356  AST 58* 56* 43*  ALT 23 23 21   ALKPHOS 146* 115 119  BILITOT 2.0* 1.8* 1.7*  PROT 6.6 5.6* 5.6*  ALBUMIN 2.0* 1.6*  1.6*   Recent Labs  Lab 10/25/23 1336  LIPASE 19   No results found for this or any previous visit (from the past 240 hour(s)).       Radiology Studies: CT ABDOMEN PELVIS W CONTRAST  Result Date: 10/26/2023 CLINICAL DATA:  Low to intermediate probability pulmonary embolism, positive D-dimer, unspecified abdominal pain, pancreatitis, peripheral edema. EXAM: CT ANGIOGRAPHY CHEST CT ABDOMEN AND PELVIS WITH CONTRAST TECHNIQUE: Multidetector CT imaging of the chest was performed using the standard protocol during bolus administration of intravenous contrast. Multiplanar CT image reconstructions and MIPs were obtained to evaluate the vascular anatomy. Multidetector CT imaging of the abdomen and pelvis was performed using the standard protocol during bolus administration of intravenous contrast. RADIATION DOSE REDUCTION: This exam was performed according to the departmental dose-optimization program which includes automated exposure control, adjustment of the mA and/or kV according to patient size and/or use of iterative reconstruction technique. CONTRAST:  75mL OMNIPAQUE IOHEXOL 350 MG/ML SOLN COMPARISON:  CT abdomen pelvis 09/10/2021 FINDINGS: CTA CHEST FINDINGS Cardiovascular: Adequate opacification of the pulmonary arterial tree. No intraluminal filling defect identified to suggest acute pulmonary embolism. Central pulmonary arteries are of normal caliber. No significant coronary artery calcification. Cardiac size within normal limits. No pericardial effusion. No significant atherosclerotic calcification within the thoracic aorta. No aortic aneurysm. Mediastinum/Nodes: No enlarged mediastinal, hilar, or axillary lymph nodes. Thyroid gland, trachea, and esophagus demonstrate no significant findings. Lungs/Pleura: Lungs are clear. No pleural effusion or pneumothorax. Musculoskeletal: No acute bone abnormality. Mild pectus excavatum deformity. No lytic or blastic bone lesion. Review of the MIP images  confirms the above findings. CT ABDOMEN and PELVIS FINDINGS Hepatobiliary: Severe, heterogeneous hepatic steatosis. No enhancing intrahepatic mass. No intra or extrahepatic biliary ductal dilation. Gallbladder unremarkable. Pancreas: The pancreas is atrophic, progressive since prior examination, with development of multiple peripancreatic fluid collections. 2.9 cm collection adjacent to the pancreatic tail appears more organized with a thin wall, uniform low attenuation of the internal contents and no significant surrounding inflammatory stranding is in keeping with a peripancreatic pseudocyst. More centrally adjacent to the body of the pancreas and just superior to the celiac axis within the gastrohepatic ligament is a more inflammatory collection demonstrating surrounding inflammatory stranding measuring 2.6 x 3.0 cm at axial image # 29/3. This may represent an acute peripancreatic inflammatory collection or reflect superimposed infection of a pre-existing peripancreatic fluid collection. The pancreatic duct is not dilated. No evidence of frank parenchymal necrosis. Spleen: Normal in size.  No intrasplenic lesion identified. Adrenals/Urinary Tract: Adrenal glands are unremarkable. Kidneys are normal, without renal calculi, focal lesion, or hydronephrosis. Bladder is unremarkable. Stomach/Bowel: Moderate ascites the stomach, small bowel, and large bowel are unremarkable. No evidence of obstruction or focal inflammation. No free intraperitoneal gas. Appendix normal. Vascular/Lymphatic: The splenic vein is chronically thrombosed with numerous gastroepiploic collaterals identified. Superior mesenteric vein and portal  vein are patent. The abdominal vasculature is otherwise unremarkable. No pathologic adenopathy within the abdomen and pelvis. Reproductive: Uterus and bilateral adnexa are unremarkable. Other: No abdominal wall hernia Musculoskeletal: No pathologic adenopathy within the abdomen and pelvis. No lytic or  blastic bone lesion. Review of the MIP images confirms the above findings. IMPRESSION: 1. No pulmonary embolism. No acute intrathoracic pathology identified. 2. Progressive pancreatic atrophy with development of multiple peripancreatic fluid collections. 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 More centrally adjacent to the body of the pancreas and just superior to the celiac axis within the gastrohepatic ligament is a more inflammatory collection demonstrating surrounding inflammatory stranding measuring 2.6 x 3.0 cm. This may represent an acute peripancreatic inflammatory collection or reflect superimposed infection of a pre-existing peripancreatic fluid collection. 3. Severe, heterogeneous hepatic steatosis. 4. Moderate ascites. 5. 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 Electronically Signed   By: Helyn Numbers M.D.   On: 10/26/2023 01:49   CT Angio Chest PE W and/or Wo Contrast  Result Date: 10/26/2023 CLINICAL DATA:  Low to intermediate probability pulmonary embolism, positive D-dimer, unspecified abdominal pain, pancreatitis, peripheral edema. EXAM: CT ANGIOGRAPHY CHEST CT ABDOMEN AND PELVIS WITH CONTRAST TECHNIQUE: Multidetector CT imaging of the chest was performed using the standard protocol during bolus administration of intravenous contrast. Multiplanar CT image reconstructions and MIPs were obtained to evaluate the vascular anatomy. Multidetector CT imaging of the abdomen and pelvis was performed using the standard protocol during bolus administration of intravenous contrast. RADIATION DOSE REDUCTION: This exam was performed according to the departmental dose-optimization program which includes automated exposure control, adjustment of the mA and/or kV according to patient size and/or use of iterative reconstruction technique. CONTRAST:  75mL OMNIPAQUE IOHEXOL 350 MG/ML SOLN COMPARISON:  CT abdomen pelvis 09/10/2021 FINDINGS:  CTA CHEST FINDINGS Cardiovascular: Adequate opacification of the pulmonary arterial tree. No intraluminal filling defect identified to suggest acute pulmonary embolism. Central pulmonary arteries are of normal caliber. No significant coronary artery calcification. Cardiac size within normal limits. No pericardial effusion. No significant atherosclerotic calcification within the thoracic aorta. No aortic aneurysm. Mediastinum/Nodes: No enlarged mediastinal, hilar, or axillary lymph nodes. Thyroid gland, trachea, and esophagus demonstrate no significant findings. Lungs/Pleura: Lungs are clear. No pleural effusion or pneumothorax. Musculoskeletal: No acute bone abnormality. Mild pectus excavatum deformity. No lytic or blastic bone lesion. Review of the MIP images confirms the above findings. CT ABDOMEN and PELVIS FINDINGS Hepatobiliary: Severe, heterogeneous hepatic steatosis. No enhancing intrahepatic mass. No intra or extrahepatic biliary ductal dilation. Gallbladder unremarkable. Pancreas: The pancreas is atrophic, progressive since prior examination, with development of multiple peripancreatic fluid collections. 2.9 cm collection adjacent to the pancreatic tail appears more organized with a thin wall, uniform low attenuation of the internal contents and no significant surrounding inflammatory stranding is in keeping with a peripancreatic pseudocyst. More centrally adjacent to the body of the pancreas and just superior to the celiac axis within the gastrohepatic ligament is a more inflammatory collection demonstrating surrounding inflammatory stranding measuring 2.6 x 3.0 cm at axial image # 29/3. This may represent an acute peripancreatic inflammatory collection or reflect superimposed infection of a pre-existing peripancreatic fluid collection. The pancreatic duct is not dilated. No evidence of frank parenchymal necrosis. Spleen: Normal in size.  No intrasplenic lesion identified. Adrenals/Urinary Tract: Adrenal  glands are unremarkable. Kidneys are normal, without renal calculi, focal lesion, or hydronephrosis. Bladder is unremarkable. Stomach/Bowel: Moderate ascites the stomach, small bowel, and large bowel are unremarkable.  No evidence of obstruction or focal inflammation. No free intraperitoneal gas. Appendix normal. Vascular/Lymphatic: The splenic vein is chronically thrombosed with numerous gastroepiploic collaterals identified. Superior mesenteric vein and portal vein are patent. The abdominal vasculature is otherwise unremarkable. No pathologic adenopathy within the abdomen and pelvis. Reproductive: Uterus and bilateral adnexa are unremarkable. Other: No abdominal wall hernia Musculoskeletal: No pathologic adenopathy within the abdomen and pelvis. No lytic or blastic bone lesion. Review of the MIP images confirms the above findings. IMPRESSION: 1. No pulmonary embolism. No acute intrathoracic pathology identified. 2. Progressive pancreatic atrophy with development of multiple peripancreatic fluid collections. 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 More centrally adjacent to the body of the pancreas and just superior to the celiac axis within the gastrohepatic ligament is a more inflammatory collection demonstrating surrounding inflammatory stranding measuring 2.6 x 3.0 cm. This may represent an acute peripancreatic inflammatory collection or reflect superimposed infection of a pre-existing peripancreatic fluid collection. 3. Severe, heterogeneous hepatic steatosis. 4. Moderate ascites. 5. 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 Electronically Signed   By: Helyn Numbers M.D.   On: 10/26/2023 01:49   DG Chest 2 View  Result Date: 10/25/2023 CLINICAL DATA:  Dyspnea on exertion. Abdominal pain with nausea, vomiting, dizziness, shortness of breath since August or September. EXAM: CHEST - 2 VIEW COMPARISON:  No prior chest radiographs are  available. Abdominal CT 09/10/2021. FINDINGS: The heart size and mediastinal contours are. Mild left basilar atelectasis or scarring. The lungs are otherwise clear. There is no effusion or pneumothorax. Moderate pectus deformity of the sternum without acute osseous abnormality. IMPRESSION: Mild left basilar atelectasis or scarring. No acute cardiopulmonary process. Electronically Signed   By: Carey Bullocks M.D.   On: 10/25/2023 13:30        Scheduled Meds:  [START ON 10/28/2023] influenza vac split trivalent PF  0.5 mL Intramuscular Tomorrow-1000   sodium chloride flush  3 mL Intravenous Q12H   Continuous Infusions:     LOS: 1 day   Time spent:  Azucena Fallen, DO [facility]  If 7PM-7AM, please contact night-coverage [email or web address]  10/27/2023, 8:01 AM

## 2023-10-28 DIAGNOSIS — E440 Moderate protein-calorie malnutrition: Secondary | ICD-10-CM | POA: Insufficient documentation

## 2023-10-28 LAB — CBC
HCT: 31 % — ABNORMAL LOW (ref 36.0–46.0)
Hemoglobin: 10.1 g/dL — ABNORMAL LOW (ref 12.0–15.0)
MCH: 30.8 pg (ref 26.0–34.0)
MCHC: 32.6 g/dL (ref 30.0–36.0)
MCV: 94.5 fL (ref 80.0–100.0)
Platelets: 228 10*3/uL (ref 150–400)
RBC: 3.28 MIL/uL — ABNORMAL LOW (ref 3.87–5.11)
RDW: 16.7 % — ABNORMAL HIGH (ref 11.5–15.5)
WBC: 4.2 10*3/uL (ref 4.0–10.5)
nRBC: 0 % (ref 0.0–0.2)

## 2023-10-28 LAB — COMPREHENSIVE METABOLIC PANEL
ALT: 23 U/L (ref 0–44)
AST: 42 U/L — ABNORMAL HIGH (ref 15–41)
Albumin: 1.6 g/dL — ABNORMAL LOW (ref 3.5–5.0)
Alkaline Phosphatase: 125 U/L (ref 38–126)
Anion gap: 10 (ref 5–15)
BUN: 7 mg/dL (ref 6–20)
CO2: 23 mmol/L (ref 22–32)
Calcium: 7.6 mg/dL — ABNORMAL LOW (ref 8.9–10.3)
Chloride: 99 mmol/L (ref 98–111)
Creatinine, Ser: 0.62 mg/dL (ref 0.44–1.00)
GFR, Estimated: >60 mL/min (ref >60)
Glucose, Bld: 119 mg/dL — ABNORMAL HIGH (ref 70–99)
Potassium: 3.2 mmol/L — ABNORMAL LOW (ref 3.5–5.1)
Sodium: 132 mmol/L — ABNORMAL LOW (ref 135–145)
Total Bilirubin: 1.4 mg/dL — ABNORMAL HIGH (ref <1.2)
Total Protein: 5.7 g/dL — ABNORMAL LOW (ref 6.5–8.1)

## 2023-10-28 MED ORDER — PANTOPRAZOLE SODIUM 40 MG PO TBEC
40.0000 mg | DELAYED_RELEASE_TABLET | Freq: Every day | ORAL | Status: DC
  Administered 2023-10-29 – 2023-10-30 (×2): 40 mg via ORAL
  Filled 2023-10-28 (×2): qty 1

## 2023-10-28 MED ORDER — HYDROCODONE-ACETAMINOPHEN 5-325 MG PO TABS
1.0000 | ORAL_TABLET | ORAL | Status: DC | PRN
  Administered 2023-10-28 – 2023-10-30 (×9): 2 via ORAL
  Filled 2023-10-28 (×9): qty 2

## 2023-10-28 NOTE — Plan of Care (Signed)
Patient still having abdominal pain. Progress well today and working to advance diet.

## 2023-10-28 NOTE — TOC CM/SW Note (Signed)
Transition of Care [facility]) - Inpatient Brief Assessment   Patient Details  Name: Brittany [name] MRN: [id number] Date of Birth: Aug 23, 1976  [facility] of [facility]) CM/SW Contact:    Tom-Johnson, Hershal Coria, RN Phone Number: 10/28/2023, 4:45 PM   Clinical Narrative:  Patient presented to the ED with  Epigastric Abdominal pain with N/V. Admitted with Infected Pancreatic Pseudocyst. Gen Sx following, no sx intervention, supportive care with pain management. GI consult.   No TOC needs or recommendations noted at this time.  Patient not Medically ready for discharge.  CM will continue to follow as patient progresses with care towards discharge.        Transition of Care Asessment: Insurance and Status: Insurance coverage has been reviewed Patient has primary care physician: Yes Home environment has been reviewed: Yes Prior level of function:: Independent Prior/Current Home Services: No current home services Social Determinants of Health Reivew: SDOH reviewed no interventions necessary Readmission risk has been reviewed: Yes Transition of care needs: no transition of care needs at this time

## 2023-10-28 NOTE — Progress Notes (Signed)
PROGRESS NOTE    Brittany [name]  YNW:[id number] DOB: 06-Sep-1976 DOA: 10/25/2023 PCP: Babs Sciara, MD   Brief Narrative:  Brittany [name] is a 47 y.o. 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   Assessment & Plan:   Principal Problem:   Infected pancreatic pseudocyst Active Problems:   Hypokalemia   Prolonged QT interval   Elevated LFTs   Malnutrition of moderate degree  Pancreatitis, recurrent, POA pancreatic pseudocyst  - Worsening epigastric pain and N/V in pt with hx of alcoholic pancreatitis; found to have pseudocysts and surrounding inflammatory changes concerning for possible infection -General Surgery following, appreciate insight recommendations -Continue supportive care, IV fluids -discontinue antibiotics per discussion with surgery -no signs or symptoms of infection at this time -No recommendations to drain at this time -Pain continues to improve - trial of diet today went poorly, de-escalated back to full liquids -Lengthy discussion with patient and mother at bedside, recommend close follow-up with outpatient GI as she apparently has not been evaluated in some time by Dr Myrtie Neither  Elevated LFTs, resolving -Likely secondary to above -no signs or symptoms of biliary obstruction, continue to monitor  Hypokalemia  -Replace, follow labs   Prolonged QT interval  -QTc 510 in ED  -Limit QT prolonging medications  DVT prophylaxis: enoxaparin (LOVENOX) injection 40 mg Start: 10/27/23 1600 SCDs Start: 10/26/23 0418   Code Status:   Code Status: Full Code  Family Communication: None present  Status is: Inpatient  Dispo: The patient is from: Home              Anticipated d/c is to: Home              Anticipated d/c  date is: 24-48 hours              Patient currently not medically stable for discharge  Consultants:  Surgery  Procedures:  None  Antimicrobials:  None   Subjective: No acute issues or events overnight currently well-controlled as is nausea vomiting  Objective: Vitals:   10/27/23 1615 10/27/23 2211 10/28/23 0558 10/28/23 0601  BP: (!) 124/95 114/82 (!) 113/102   Pulse: (!) 110 93 95   Resp: 16 18 18    Temp: 99.3 F (37.4 C) 98.1 F (36.7 C) 98.2 F (36.8 C)   TempSrc: Oral Oral Oral   SpO2: 100% 98% 100%   Weight:    69 kg  Height:       No intake or output data in the 24 hours ending 10/28/23 0814  Filed Weights   10/26/23 0500 10/27/23 0500 10/28/23 0601  Weight: 66.2 kg 68.9 kg 69 kg    Examination:  General:  Pleasantly resting in bed, No acute distress. HEENT:  Normocephalic atraumatic.  Sclerae nonicteric, noninjected.  Extraocular movements intact bilaterally. Neck:  Without mass or deformity.  Trachea is midline. Lungs:  Clear to auscultate bilaterally without rhonchi, wheeze, or rales. Heart:  Regular rate and rhythm.  Without murmurs, rubs, or gallops. Abdomen: Soft, diffusely tender, PMI right upper quadrant, minimally distended.  Without guarding or rebound. Extremities: Without cyanosis, clubbing, edema, or obvious deformity. Skin:  Warm and dry, no erythema.  Data Reviewed: I have personally reviewed following labs and imaging studies  CBC: Recent Labs  Lab 10/25/23 1336 10/26/23 0508 10/27/23 0356  WBC 5.3 3.2*  2.6*  HGB 10.8* 9.2* 9.6*  HCT 32.8* 28.1* 30.1*  MCV 92.9 92.7 95.0  PLT 259 199 202   Basic Metabolic Panel: Recent Labs  Lab 10/25/23 1336 10/26/23 0508 10/27/23 0356  NA 132* 132* 136  K 3.2* 4.0 3.7  CL 89* 94* 103  CO2 31 27 21*  GLUCOSE 105* 82 57*  BUN 8 6 6   CREATININE 0.64 0.68 0.66  CALCIUM 7.6* 7.2* 7.4*   GFR: Estimated Creatinine Clearance: 83.8 mL/min (by C-G formula based on SCr of 0.66 mg/dL). Liver  Function Tests: Recent Labs  Lab 10/25/23 1336 10/26/23 0508 10/27/23 0356  AST 58* 56* 43*  ALT 23 23 21   ALKPHOS 146* 115 119  BILITOT 2.0* 1.8* 1.7*  PROT 6.6 5.6* 5.6*  ALBUMIN 2.0* 1.6* 1.6*   Recent Labs  Lab 10/25/23 1336  LIPASE 19   No results found for this or any previous visit (from the past 240 hour(s)).   Radiology Studies: No results found.  Scheduled Meds:  B-complex with vitamin C  1 tablet Oral Daily   enoxaparin (LOVENOX) injection  40 mg Subcutaneous Q24H   feeding supplement  1 Container Oral TID BM   influenza vac split trivalent PF  0.5 mL Intramuscular Tomorrow-1000   multivitamin with minerals  1 tablet Oral Daily   pantoprazole (PROTONIX) IV  40 mg Intravenous Daily   sodium chloride flush  3 mL Intravenous Q12H   Continuous Infusions:  LOS: 2 days  Time spent:  Azucena Fallen, DO [facility]  If 7PM-7AM, please contact night-coverage [email or web address]  10/28/2023, 8:14 AM

## 2023-10-29 LAB — BASIC METABOLIC PANEL
Anion gap: 5 (ref 5–15)
BUN: 7 mg/dL (ref 6–20)
CO2: 27 mmol/L (ref 22–32)
Calcium: 7.7 mg/dL — ABNORMAL LOW (ref 8.9–10.3)
Chloride: 103 mmol/L (ref 98–111)
Creatinine, Ser: 0.56 mg/dL (ref 0.44–1.00)
GFR, Estimated: >60 mL/min (ref >60)
Glucose, Bld: 98 mg/dL (ref 70–99)
Potassium: 4.1 mmol/L (ref 3.5–5.1)
Sodium: 135 mmol/L (ref 135–145)

## 2023-10-29 NOTE — Progress Notes (Signed)
PROGRESS NOTE    Brittany [name]  ZOX:[id number] DOB: 1975/12/29 DOA: 10/25/2023 PCP: Babs Sciara, MD    Brief Narrative:   Brittany [name] is a 47 y.o. female with past medical history significant for bipolar disorder, chronic splenic vein occlusion, gastric varices, chronic pancreatitis with pseudocyst, EtOH abuse, severe hepatic steatosis, who presented to [facility] ED on 11/2 with progressive nausea/vomiting and abdominal pain. Patient reports 2 to 3 months of intermittent epigastric pain with nausea and nonbloody vomiting. The symptoms have worsened in recent days and she states that she is unable to tolerate more than a couple bites of food without vomiting. She also feels bloated and has developed bilateral lower extremity edema. She denies any fevers, hematemesis, melena, or hematochezia. She denies any recent alcohol use.   In the ED, afebrile, saturating well on room air with transient tachycardia and stable blood pressure.  Labs are most notable for sodium 132, potassium 3.2, albumin 2.0, bilirubin 2.0, and normal WBC.  CTA chest is negative for PE or other acute findings.  CT of the abdomen and pelvis demonstrates severe hepatic steatosis, chronic thrombosis of splenic vein, and progressive pancreatic atrophy with development of multiple peripancreatic fluid collections including one with surrounding inflammatory stranding.  General surgery consulted by the ED physician and the patient was treated with fentanyl x 2, Dilaudid, Zofran x 2, 1 L of NS, oral potassium, and Zosyn. TRH consulted for admission.  Assessment & Plan:   Pancreatitis, recurrent Pancreatic pseudocyst Patient to ED with progressive epigastric pain associate with nausea and vomiting.  History of alcoholic pancreatitis with known inflammatory changes and pseudocysts.  Initially concern for possible infection.  Seen by general surgery, Dr. Freida Busman on 10/26/2023; pancreatic collections likely represent developing  pseudocyst and given absence of gas, leukocytosis or fevers to suggest infection no indication for antibiotics for pancreatic's at this time.  Also do not recommend any attempts of drainage as they have not matured yet into walled off pseudocyst and not large enough to cause mass effect. -- Advance to soft diet today -- Compazine 5 mg IV every 6 hours as needed nausea/vomiting -- Norco 5-325 mg 1-2 tablets every 4 hours as needed moderate/severe pain -- Outpatient follow-up with GI, Dr. Caren Macadam outpatient -- Continue to encourage alcohol abstinence/cessation  GERD -- Continue PPI  Hypokalemia -- Repleted  Elevated LFTs: Improving CT ab/pelvis with severe hepatic steatosis. -- Avoid hepatotoxins   DVT prophylaxis: enoxaparin (LOVENOX) injection 40 mg Start: 10/27/23 1600 SCDs Start: 10/26/23 0418    Code Status: Full Code Family Communication: Family present at bedside  Disposition Plan:  Level of care: Telemetry Medical Status is: Inpatient Remains inpatient appropriate because: Advancing diet further today, anticipate discharge home likely tomorrow if tolerates    Consultants:  General surgery, Dr. Freida Busman  Procedures:  None Antimicrobials:  Zosyn 11/2 - 11/2   Subjective: Patient seen examined bedside, resting calmly.  Lying in bed.  Mother present.  Pain improved.  Tolerating full liquid diet and wishes further advancement today.  No other specific questions or concerns at this time.  Discussed with patient and mother, needs close follow-up with GI, states already has set up a follow-up appointment with Dr. Caren Macadam over next couple weeks.  Denies headache, no visual changes, no chest pain, no palpitations, no shortness of breath, no abdominal pain, no fever/chills/night sweats, no nausea/vomiting/diarrhea, no focal weakness, no fatigue, no paresthesia.  No acute events overnight per nursing staff.  Objective: Vitals:   10/28/23 2208  10/29/23 0351 10/29/23 0447 10/29/23 0843   BP: 110/87 103/77  111/84  Pulse: 95 84  (!) 110  Resp:    18  Temp: 98.6 F (37 C) 97.8 F (36.6 C)  98.1 F (36.7 C)  TempSrc: Oral Oral    SpO2: 100% 100%  100%  Weight:   69.8 kg   Height:        Intake/Output Summary (Last 24 hours) at 10/29/2023 1635 Last data filed at 10/29/2023 1115 Gross per 24 hour  Intake 363 ml  Output --  Net 363 ml   Filed Weights   10/27/23 0500 10/28/23 0601 10/29/23 0447  Weight: 68.9 kg 69 kg 69.8 kg    Examination:  Physical Exam: GEN: NAD, alert and oriented x 3, chronically ill appearance, appears older than stated age HEENT: NCAT, PERRL, EOMI, sclera clear, MMM PULM: CTAB w/o wheezes/crackles, normal respiratory effort, on room air CV: RRR w/o M/G/R GI: abd soft, NTND, + BS MSK: no peripheral edema, muscle strength globally intact 5/5 bilateral upper/lower extremities NEURO: CN II-XII intact, no focal deficits, sensation to light touch intact PSYCH: normal mood/affect Integumentary: dry/intact, no rashes or wounds    Data Reviewed: I have personally reviewed following labs and imaging studies  CBC: Recent Labs  Lab 10/25/23 1336 10/26/23 0508 10/27/23 0356 10/28/23 0715  WBC 5.3 3.2* 2.6* 4.2  HGB 10.8* 9.2* 9.6* 10.1*  HCT 32.8* 28.1* 30.1* 31.0*  MCV 92.9 92.7 95.0 94.5  PLT 259 199 202 228   Basic Metabolic Panel: Recent Labs  Lab 10/25/23 1336 10/26/23 0508 10/27/23 0356 10/28/23 0715 10/29/23 0558  NA 132* 132* 136 132* 135  K 3.2* 4.0 3.7 3.2* 4.1  CL 89* 94* 103 99 103  CO2 31 27 21* 23 27  GLUCOSE 105* 82 57* 119* 98  BUN 8 6 6 7 7   CREATININE 0.64 0.68 0.66 0.62 0.56  CALCIUM 7.6* 7.2* 7.4* 7.6* 7.7*   GFR: Estimated Creatinine Clearance: 84.2 mL/min (by C-G formula based on SCr of 0.56 mg/dL). Liver Function Tests: Recent Labs  Lab 10/25/23 1336 10/26/23 0508 10/27/23 0356 10/28/23 0715  AST 58* 56* 43* 42*  ALT 23 23 21 23   ALKPHOS 146* 115 119 125  BILITOT 2.0* 1.8* 1.7* 1.4*  PROT  6.6 5.6* 5.6* 5.7*  ALBUMIN 2.0* 1.6* 1.6* 1.6*   Recent Labs  Lab 10/25/23 1336  LIPASE 19   No results for input(s): "AMMONIA" in the last 168 hours. Coagulation Profile: No results for input(s): "INR", "PROTIME" in the last 168 hours. Cardiac Enzymes: No results for input(s): "CKTOTAL", "CKMB", "CKMBINDEX", "TROPONINI" in the last 168 hours. BNP (last 3 results) No results for input(s): "PROBNP" in the last 8760 hours. HbA1C: No results for input(s): "HGBA1C" in the last 72 hours. CBG: No results for input(s): "GLUCAP" in the last 168 hours. Lipid Profile: No results for input(s): "CHOL", "HDL", "LDLCALC", "TRIG", "CHOLHDL", "LDLDIRECT" in the last 72 hours. Thyroid Function Tests: No results for input(s): "TSH", "T4TOTAL", "FREET4", "T3FREE", "THYROIDAB" in the last 72 hours. Anemia Panel: No results for input(s): "VITAMINB12", "FOLATE", "FERRITIN", "TIBC", "IRON", "RETICCTPCT" in the last 72 hours. Sepsis Labs: No results for input(s): "PROCALCITON", "LATICACIDVEN" in the last 168 hours.  No results found for this or any previous visit (from the past 240 hour(s)).       Radiology Studies: No results found.      Scheduled Meds:  B-complex with vitamin C  1 tablet Oral Daily   enoxaparin (LOVENOX)  injection  40 mg Subcutaneous Q24H   feeding supplement  1 Container Oral TID BM   multivitamin with minerals  1 tablet Oral Daily   pantoprazole  40 mg Oral Daily   sodium chloride flush  3 mL Intravenous Q12H   Continuous Infusions:   LOS: 3 days    Time spent: 53 minutes spent on chart review, discussion with nursing staff, consultants, updating family and interview/physical exam; more than 50% of that time was spent in counseling and/or coordination of care.    Alvira Philips [place], DO [facility] Available via Epic secure chat 7am-7pm After these hours, please refer to coverage provider listed on [email or web address] 10/29/2023, 4:35 PM

## 2023-10-29 NOTE — Plan of Care (Signed)

## 2023-10-29 NOTE — Progress Notes (Signed)
   10/29/23 0956  Mobility  Activity Ambulated independently in room  Level of Assistance Standby assist, set-up cues, supervision of patient - no hands on  Assistive Device None  Distance Ambulated (ft) 100 ft  Activity Response Tolerated fair  Mobility Referral Yes  $Mobility charge 1 Mobility  Mobility Specialist Start Time (ACUTE ONLY) 0947  Mobility Specialist Stop Time (ACUTE ONLY) 1003  Mobility Specialist Time Calculation (min) (ACUTE ONLY) 16 min   Mobility Specialist: Progress Note  Post-Mobility: HR 46-92  Pt agreeable to mobility session - received in EOB . Required SB with no AD, occasional use of walls for stability. C/o bloated on abd R side with ongoing pain that "wraps around to the back," numbness in hands and legs, swollen in LE, pt stated she noticed her L hand was swollen after fluids. Pins and needles when ambulating. Returned to EOB with all needs met - call bell within reach.   Barnie Mort, BS Mobility Specialist Please contact via SecureChat or Rehab office at [phone].

## 2023-10-30 MED ORDER — OMEPRAZOLE 40 MG PO CPDR: 40.0000 mg | DELAYED_RELEASE_CAPSULE | Freq: Every day | ORAL | 2 refills | Status: DC

## 2023-10-30 MED ORDER — HYDROCODONE-ACETAMINOPHEN 5-325 MG PO TABS: 1.0000 | ORAL_TABLET | Freq: Four times a day (QID) | ORAL | 0 refills | Status: DC | PRN

## 2023-10-30 MED ORDER — ONDANSETRON HCL 4 MG PO TABS: 4.0000 mg | ORAL_TABLET | Freq: Three times a day (TID) | ORAL | 2 refills | Status: DC | PRN

## 2023-10-30 NOTE — Plan of Care (Signed)
  Problem: Education: Goal: Knowledge of General Education information will improve Description: Including pain rating scale, medication(s)/side effects and non-pharmacologic comfort measures Outcome: Adequate for Discharge   Problem: Health Behavior/Discharge Planning: Goal: Ability to manage health-related needs will improve Outcome: Adequate for Discharge   Problem: Clinical Measurements: Goal: Ability to maintain clinical measurements within normal limits will improve Outcome: Adequate for Discharge Goal: Will remain free from infection Outcome: Adequate for Discharge Goal: Diagnostic test results will improve Outcome: Adequate for Discharge Goal: Respiratory complications will improve Outcome: Adequate for Discharge Goal: Cardiovascular complication will be avoided Outcome: Adequate for Discharge   Problem: Activity: Goal: Risk for activity intolerance will decrease Outcome: Adequate for Discharge   Problem: Nutrition: Goal: Adequate nutrition will be maintained Outcome: Adequate for Discharge   Problem: Coping: Goal: Level of anxiety will decrease Outcome: Adequate for Discharge   Problem: Elimination: Goal: Will not experience complications related to bowel motility Outcome: Adequate for Discharge Goal: Will not experience complications related to urinary retention Outcome: Adequate for Discharge   Problem: Pain Management: Goal: General experience of comfort will improve Outcome: Adequate for Discharge   Problem: Safety: Goal: Ability to remain free from injury will improve Outcome: Adequate for Discharge   Problem: Skin Integrity: Goal: Risk for impaired skin integrity will decrease Outcome: Adequate for Discharge   Problem: Malnutrition  (NI-5.2) Goal: Food and/or nutrient delivery Description: Individualized approach for food/nutrient provision. Outcome: Adequate for Discharge

## 2023-10-30 NOTE — Discharge Summary (Signed)
Physician Discharge Summary  [name] RJJ:[id number] DOB: 1976/07/06 DOA: 10/25/2023  PCP: Babs Sciara, MD  Admit date: 10/25/2023 Discharge date: 10/30/2023  Admitted From: Home Disposition: Home  Recommendations for Outpatient Follow-up:  Follow up with PCP in 1-2 weeks Follow-up with gastroenterology, Dr. Myrtie Neither as scheduled 3.  Continue to encourage alcohol cessation/abstinence 4.  Recommend repeat CMP 1 week.  Home Health: No Equipment/Devices: None  Discharge Condition: Stable CODE STATUS: Full code Diet recommendation: Soft diet  History of present illness:  Brittany [name] is a 47 y.o. female with past medical history significant for bipolar disorder, chronic splenic vein occlusion, gastric varices, chronic pancreatitis with pseudocyst, EtOH abuse, severe hepatic steatosis, who presented to [facility] ED on 11/2 with progressive nausea/vomiting and abdominal pain. Patient reports 2 to 3 months of intermittent epigastric pain with nausea and nonbloody vomiting. The symptoms have worsened in recent days and she states that she is unable to tolerate more than a couple bites of food without vomiting. She also feels bloated and has developed bilateral lower extremity edema. She denies any fevers, hematemesis, melena, or hematochezia. She denies any recent alcohol use.    In the ED, afebrile, saturating well on room air with transient tachycardia and stable blood pressure.  Labs are most notable for sodium 132, potassium 3.2, albumin 2.0, bilirubin 2.0, and normal WBC.  CTA chest is negative for PE or other acute findings.  CT of the abdomen and pelvis demonstrates severe hepatic steatosis, chronic thrombosis of splenic vein, and progressive pancreatic atrophy with development of multiple peripancreatic fluid collections including one with surrounding inflammatory stranding.  General surgery consulted by the ED physician and the patient was treated with fentanyl x 2, Dilaudid,  Zofran x 2, 1 L of NS, oral potassium, and Zosyn. TRH consulted for admission.  Hospital course:  Pancreatitis, recurrent Pancreatic pseudocyst Patient to ED with progressive epigastric pain associate with nausea and vomiting.  History of alcoholic pancreatitis with known inflammatory changes and pseudocysts.  Initially concern for possible infection.  Seen by general surgery, Dr. Freida Busman on 10/26/2023; pancreatic collections likely represent developing pseudocyst and given absence of gas, leukocytosis or fevers to suggest infection no indication for antibiotics for pancreatic's at this time.  Also do not recommend any attempts of drainage as they have not matured yet into walled off pseudocyst and not large enough to cause mass effect.  Patient was supported with IV fluids, antiemetics, pain medications and was able to slowly advance diet with toleration.  Patient has outpatient follow-up scheduled with gastroenterology, Dr. Myrtie Neither in the next 2 weeks.  Recommend continue soft, easy digestible diet.  Continue to encourage alcohol abstinence/cessation.  Outpatient follow-up with PCP.   GERD Continue omeprazole   Hypokalemia Repleted during hospitalization.   Elevated LFTs: Improving CT ab/pelvis with severe hepatic steatosis.  Slowly improving during hospitalization.  Recommend repeat CMP 1 week.  Discharge Diagnoses:  Principal Problem:   Infected pancreatic pseudocyst Active Problems:   Hypokalemia   Prolonged QT interval   Elevated LFTs   Malnutrition of moderate degree    Discharge Instructions  Discharge Instructions     Call MD for:  difficulty breathing, headache or visual disturbances   Complete by: As directed    Call MD for:  extreme fatigue   Complete by: As directed    Call MD for:  persistant dizziness or light-headedness   Complete by: As directed    Call MD for:  persistant nausea and vomiting  Complete by: As directed    Call MD for:  severe uncontrolled pain    Complete by: As directed    Call MD for:  temperature >100.4   Complete by: As directed    Diet - low sodium heart healthy   Complete by: As directed    Increase activity slowly   Complete by: As directed       Allergies as of 10/30/2023       Reactions   Vicoprofen [hydrocodone-ibuprofen] Nausea Only   Doxycycline Nausea And Vomiting   Morphine And Codeine Itching        Medication List     TAKE these medications    acetaminophen 500 MG tablet Commonly known as: TYLENOL Take 500 mg by mouth every 6 (six) hours as needed for moderate pain.   HYDROcodone-acetaminophen 5-325 MG tablet Commonly known as: NORCO/VICODIN Take 1 tablet by mouth every 6 (six) hours as needed for moderate pain (pain score 4-6).   omeprazole 40 MG capsule Commonly known as: PRILOSEC Take 1 capsule (40 mg total) by mouth daily.   ondansetron 4 MG tablet Commonly known as: ZOFRAN Take 1 tablet (4 mg total) by mouth every 8 (eight) hours as needed.        Follow-up Information     Luking, Jonna Coup, MD. Schedule an appointment as soon as possible for a visit in 1 week(s).   Specialty: Family Medicine Contact information: [address] [place] [address] [phone]         Sherrilyn Rist, MD. Go to.   Specialty: Gastroenterology Contact information: [address] [place] [address] [place] [address] [phone]                Allergies  Allergen Reactions   Vicoprofen [Hydrocodone-Ibuprofen] Nausea Only   Doxycycline Nausea And Vomiting   Morphine And Codeine Itching    Consultations: General Surgery   Procedures/Studies: CT ABDOMEN PELVIS W CONTRAST  Result Date: 10/26/2023 CLINICAL DATA:  Low to intermediate probability pulmonary embolism, positive D-dimer, unspecified abdominal pain, pancreatitis, peripheral edema. EXAM: CT ANGIOGRAPHY CHEST CT ABDOMEN AND PELVIS WITH CONTRAST TECHNIQUE: Multidetector CT imaging of the chest was performed using  the standard protocol during bolus administration of intravenous contrast. Multiplanar CT image reconstructions and MIPs were obtained to evaluate the vascular anatomy. Multidetector CT imaging of the abdomen and pelvis was performed using the standard protocol during bolus administration of intravenous contrast. RADIATION DOSE REDUCTION: This exam was performed according to the departmental dose-optimization program which includes automated exposure control, adjustment of the mA and/or kV according to patient size and/or use of iterative reconstruction technique. CONTRAST:  75mL OMNIPAQUE IOHEXOL 350 MG/ML SOLN COMPARISON:  CT abdomen pelvis 09/10/2021 FINDINGS: CTA CHEST FINDINGS Cardiovascular: Adequate opacification of the pulmonary arterial tree. No intraluminal filling defect identified to suggest acute pulmonary embolism. Central pulmonary arteries are of normal caliber. No significant coronary artery calcification. Cardiac size within normal limits. No pericardial effusion. No significant atherosclerotic calcification within the thoracic aorta. No aortic aneurysm. Mediastinum/Nodes: No enlarged mediastinal, hilar, or axillary lymph nodes. Thyroid gland, trachea, and esophagus demonstrate no significant findings. Lungs/Pleura: Lungs are clear. No pleural effusion or pneumothorax. Musculoskeletal: No acute bone abnormality. Mild pectus excavatum deformity. No lytic or blastic bone lesion. Review of the MIP images confirms the above findings. CT ABDOMEN and PELVIS FINDINGS Hepatobiliary: Severe, heterogeneous hepatic steatosis. No enhancing intrahepatic mass. No intra or extrahepatic biliary ductal dilation. Gallbladder unremarkable. Pancreas: The pancreas is  atrophic, progressive since prior examination, with development of multiple peripancreatic fluid collections. 2.9 cm collection adjacent to the pancreatic tail appears more organized with a thin wall, uniform low attenuation of the internal contents and no  significant surrounding inflammatory stranding is in keeping with a peripancreatic pseudocyst. More centrally adjacent to the body of the pancreas and just superior to the celiac axis within the gastrohepatic ligament is a more inflammatory collection demonstrating surrounding inflammatory stranding measuring 2.6 x 3.0 cm at axial image # 29/3. This may represent an acute peripancreatic inflammatory collection or reflect superimposed infection of a pre-existing peripancreatic fluid collection. The pancreatic duct is not dilated. No evidence of frank parenchymal necrosis. Spleen: Normal in size.  No intrasplenic lesion identified. Adrenals/Urinary Tract: Adrenal glands are unremarkable. Kidneys are normal, without renal calculi, focal lesion, or hydronephrosis. Bladder is unremarkable. Stomach/Bowel: Moderate ascites the stomach, small bowel, and large bowel are unremarkable. No evidence of obstruction or focal inflammation. No free intraperitoneal gas. Appendix normal. Vascular/Lymphatic: The splenic vein is chronically thrombosed with numerous gastroepiploic collaterals identified. Superior mesenteric vein and portal vein are patent. The abdominal vasculature is otherwise unremarkable. No pathologic adenopathy within the abdomen and pelvis. Reproductive: Uterus and bilateral adnexa are unremarkable. Other: No abdominal wall hernia Musculoskeletal: No pathologic adenopathy within the abdomen and pelvis. No lytic or blastic bone lesion. Review of the MIP images confirms the above findings. IMPRESSION: 1. No pulmonary embolism. No acute intrathoracic pathology identified. 2. Progressive pancreatic atrophy with development of multiple peripancreatic fluid collections. 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 More centrally adjacent to the body of the pancreas and just superior to the celiac axis within the gastrohepatic ligament is a more inflammatory collection demonstrating surrounding  inflammatory stranding measuring 2.6 x 3.0 cm. This may represent an acute peripancreatic inflammatory collection or reflect superimposed infection of a pre-existing peripancreatic fluid collection. 3. Severe, heterogeneous hepatic steatosis. 4. Moderate ascites. 5. 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 Electronically Signed   By: Helyn Numbers M.D.   On: 10/26/2023 01:49   CT Angio Chest PE W and/or Wo Contrast  Result Date: 10/26/2023 CLINICAL DATA:  Low to intermediate probability pulmonary embolism, positive D-dimer, unspecified abdominal pain, pancreatitis, peripheral edema. EXAM: CT ANGIOGRAPHY CHEST CT ABDOMEN AND PELVIS WITH CONTRAST TECHNIQUE: Multidetector CT imaging of the chest was performed using the standard protocol during bolus administration of intravenous contrast. Multiplanar CT image reconstructions and MIPs were obtained to evaluate the vascular anatomy. Multidetector CT imaging of the abdomen and pelvis was performed using the standard protocol during bolus administration of intravenous contrast. RADIATION DOSE REDUCTION: This exam was performed according to the departmental dose-optimization program which includes automated exposure control, adjustment of the mA and/or kV according to patient size and/or use of iterative reconstruction technique. CONTRAST:  75mL OMNIPAQUE IOHEXOL 350 MG/ML SOLN COMPARISON:  CT abdomen pelvis 09/10/2021 FINDINGS: CTA CHEST FINDINGS Cardiovascular: Adequate opacification of the pulmonary arterial tree. No intraluminal filling defect identified to suggest acute pulmonary embolism. Central pulmonary arteries are of normal caliber. No significant coronary artery calcification. Cardiac size within normal limits. No pericardial effusion. No significant atherosclerotic calcification within the thoracic aorta. No aortic aneurysm. Mediastinum/Nodes: No enlarged mediastinal, hilar, or axillary lymph nodes.  Thyroid gland, trachea, and esophagus demonstrate no significant findings. Lungs/Pleura: Lungs are clear. No pleural effusion or pneumothorax. Musculoskeletal: No acute bone abnormality. Mild pectus excavatum deformity. No lytic or blastic bone lesion. Review of the MIP images confirms  the above findings. CT ABDOMEN and PELVIS FINDINGS Hepatobiliary: Severe, heterogeneous hepatic steatosis. No enhancing intrahepatic mass. No intra or extrahepatic biliary ductal dilation. Gallbladder unremarkable. Pancreas: The pancreas is atrophic, progressive since prior examination, with development of multiple peripancreatic fluid collections. 2.9 cm collection adjacent to the pancreatic tail appears more organized with a thin wall, uniform low attenuation of the internal contents and no significant surrounding inflammatory stranding is in keeping with a peripancreatic pseudocyst. More centrally adjacent to the body of the pancreas and just superior to the celiac axis within the gastrohepatic ligament is a more inflammatory collection demonstrating surrounding inflammatory stranding measuring 2.6 x 3.0 cm at axial image # 29/3. This may represent an acute peripancreatic inflammatory collection or reflect superimposed infection of a pre-existing peripancreatic fluid collection. The pancreatic duct is not dilated. No evidence of frank parenchymal necrosis. Spleen: Normal in size.  No intrasplenic lesion identified. Adrenals/Urinary Tract: Adrenal glands are unremarkable. Kidneys are normal, without renal calculi, focal lesion, or hydronephrosis. Bladder is unremarkable. Stomach/Bowel: Moderate ascites the stomach, small bowel, and large bowel are unremarkable. No evidence of obstruction or focal inflammation. No free intraperitoneal gas. Appendix normal. Vascular/Lymphatic: The splenic vein is chronically thrombosed with numerous gastroepiploic collaterals identified. Superior mesenteric vein and portal vein are patent. The  abdominal vasculature is otherwise unremarkable. No pathologic adenopathy within the abdomen and pelvis. Reproductive: Uterus and bilateral adnexa are unremarkable. Other: No abdominal wall hernia Musculoskeletal: No pathologic adenopathy within the abdomen and pelvis. No lytic or blastic bone lesion. Review of the MIP images confirms the above findings. IMPRESSION: 1. No pulmonary embolism. No acute intrathoracic pathology identified. 2. Progressive pancreatic atrophy with development of multiple peripancreatic fluid collections. 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 More centrally adjacent to the body of the pancreas and just superior to the celiac axis within the gastrohepatic ligament is a more inflammatory collection demonstrating surrounding inflammatory stranding measuring 2.6 x 3.0 cm. This may represent an acute peripancreatic inflammatory collection or reflect superimposed infection of a pre-existing peripancreatic fluid collection. 3. Severe, heterogeneous hepatic steatosis. 4. Moderate ascites. 5. 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 Electronically Signed   By: Helyn Numbers M.D.   On: 10/26/2023 01:49   DG Chest 2 View  Result Date: 10/25/2023 CLINICAL DATA:  Dyspnea on exertion. Abdominal pain with nausea, vomiting, dizziness, shortness of breath since August or September. EXAM: CHEST - 2 VIEW COMPARISON:  No prior chest radiographs are available. Abdominal CT 09/10/2021. FINDINGS: The heart size and mediastinal contours are. Mild left basilar atelectasis or scarring. The lungs are otherwise clear. There is no effusion or pneumothorax. Moderate pectus deformity of the sternum without acute osseous abnormality. IMPRESSION: Mild left basilar atelectasis or scarring. No acute cardiopulmonary process. Electronically Signed   By: Carey Bullocks M.D.   On: 10/25/2023 13:30     Subjective: Patient seen examined bedside,  resting calmly.  Abdominal pain much improved.  Tolerating advance diet.  Has scheduled outpatient follow-up with gastroenterology in the next 2 weeks.  No other specific questions or concerns at this time.  Denies headache, no dizziness, no chest pain, no palpitations, no shortness of breath, no fever/chills/night sweats, no nausea/vomiting/diarrhea, no focal weakness, no fatigue, no paresthesias.  No acute events overnight per nursing staff.  Discharge Exam: Vitals:   10/29/23 2050 10/30/23 0832  BP: 112/82 99/67  Pulse: (!) 106 (!) 110  Resp: 20 18  Temp: 98.8 F (37.1 C) 98.6 F (  37 C)  SpO2: 100% 100%   Vitals:   10/29/23 1640 10/29/23 2050 10/30/23 0327 10/30/23 0832  BP: 100/74 112/82  99/67  Pulse: 92 (!) 106  (!) 110  Resp: 18 20  18   Temp: 98.2 F (36.8 C) 98.8 F (37.1 C)  98.6 F (37 C)  TempSrc:  Oral  Oral  SpO2: 97% 100%  100%  Weight:   69.9 kg   Height:        Physical Exam: GEN: NAD, alert and oriented x 3, chronically ill in appearance, appears older than stated age HEENT: NCAT, PERRL, EOMI, sclera clear, MMM PULM: CTAB w/o wheezes/crackles, normal respiratory effort, on room air CV: RRR w/o M/G/R GI: abd soft, mild lower quadrant TTP, nondistended, + BS, no R/G/M MSK: no peripheral edema, muscle strength globally intact 5/5 bilateral upper/lower extremities NEURO: CN II-XII intact, no focal deficits, sensation to light touch intact PSYCH: normal mood/affect Integumentary: dry/intact, no rashes or wounds    The results of significant diagnostics from this hospitalization (including imaging, microbiology, ancillary and laboratory) are listed below for reference.     Microbiology: No results found for this or any previous visit (from the past 240 hour(s)).   Labs: BNP (last 3 results) Recent Labs    10/25/23 1336  BNP 90.0   Basic Metabolic Panel: Recent Labs  Lab 10/25/23 1336 10/26/23 0508 10/27/23 0356 10/28/23 0715 10/29/23 0558  NA  132* 132* 136 132* 135  K 3.2* 4.0 3.7 3.2* 4.1  CL 89* 94* 103 99 103  CO2 31 27 21* 23 27  GLUCOSE 105* 82 57* 119* 98  BUN 8 6 6 7 7   CREATININE 0.64 0.68 0.66 0.62 0.56  CALCIUM 7.6* 7.2* 7.4* 7.6* 7.7*   Liver Function Tests: Recent Labs  Lab 10/25/23 1336 10/26/23 0508 10/27/23 0356 10/28/23 0715  AST 58* 56* 43* 42*  ALT 23 23 21 23   ALKPHOS 146* 115 119 125  BILITOT 2.0* 1.8* 1.7* 1.4*  PROT 6.6 5.6* 5.6* 5.7*  ALBUMIN 2.0* 1.6* 1.6* 1.6*   Recent Labs  Lab 10/25/23 1336  LIPASE 19   No results for input(s): "AMMONIA" in the last 168 hours. CBC: Recent Labs  Lab 10/25/23 1336 10/26/23 0508 10/27/23 0356 10/28/23 0715  WBC 5.3 3.2* 2.6* 4.2  HGB 10.8* 9.2* 9.6* 10.1*  HCT 32.8* 28.1* 30.1* 31.0*  MCV 92.9 92.7 95.0 94.5  PLT 259 199 202 228   Cardiac Enzymes: No results for input(s): "CKTOTAL", "CKMB", "CKMBINDEX", "TROPONINI" in the last 168 hours. BNP: Invalid input(s): "POCBNP" CBG: No results for input(s): "GLUCAP" in the last 168 hours. D-Dimer No results for input(s): "DDIMER" in the last 72 hours. Hgb A1c No results for input(s): "HGBA1C" in the last 72 hours. Lipid Profile No results for input(s): "CHOL", "HDL", "LDLCALC", "TRIG", "CHOLHDL", "LDLDIRECT" in the last 72 hours. Thyroid function studies No results for input(s): "TSH", "T4TOTAL", "T3FREE", "THYROIDAB" in the last 72 hours.  Invalid input(s): "FREET3" Anemia work up No results for input(s): "VITAMINB12", "FOLATE", "FERRITIN", "TIBC", "IRON", "RETICCTPCT" in the last 72 hours. Urinalysis    Component Value Date/Time   COLORURINE AMBER (A) 09/10/2021 1028   APPEARANCEUR HAZY (A) 09/10/2021 1028   LABSPEC 1.026 09/10/2021 1028   PHURINE 6.0 09/10/2021 1028   GLUCOSEU NEGATIVE 09/10/2021 1028   HGBUR SMALL (A) 09/10/2021 1028   BILIRUBINUR SMALL (A) 09/10/2021 1028   KETONESUR 5 (A) 09/10/2021 1028   PROTEINUR 30 (A) 09/10/2021 1028   NITRITE NEGATIVE  09/10/2021 1028    LEUKOCYTESUR NEGATIVE 09/10/2021 1028   Sepsis Labs Recent Labs  Lab 10/25/23 1336 10/26/23 0508 10/27/23 0356 10/28/23 0715  WBC 5.3 3.2* 2.6* 4.2   Microbiology No results found for this or any previous visit (from the past 240 hour(s)).   Time coordinating discharge: Over 30 minutes  SIGNED:   Alvira Philips [place], DO  [facility] 10/30/2023, 10:03 AM

## 2023-10-30 NOTE — Progress Notes (Signed)
Discharge instructions (including medications) discussed with and copy provided to patient/caregiver 

## 2023-10-31 ENCOUNTER — Telehealth: Payer: Self-pay

## 2023-10-31 NOTE — Transitions of Care (Post Inpatient/ED Visit) (Unsigned)
   10/31/2023  Name: Brittany [name] MRN: [id number] DOB: 16-Mar-1976  Today's TOC FU Call Status: Today's TOC FU Call Status:: Unsuccessful Call (1st Attempt) Unsuccessful Call (1st Attempt) Date: 10/31/23  Attempted to reach the patient regarding the most recent Inpatient/ED visit.  Follow Up Plan: Additional outreach attempts will be made to reach the patient to complete the Transitions of Care (Post Inpatient/ED visit) call.   Signature Karena Addison, LPN [facility] Nurse Health Advisor Direct Dial [phone]

## 2023-11-04 ENCOUNTER — Telehealth: Payer: Self-pay | Admitting: Family Medicine

## 2023-11-04 ENCOUNTER — Encounter: Payer: Self-pay | Admitting: Gastroenterology

## 2023-11-04 ENCOUNTER — Ambulatory Visit (INDEPENDENT_AMBULATORY_CARE_PROVIDER_SITE_OTHER): Payer: Self-pay | Admitting: Gastroenterology

## 2023-11-04 VITALS — BP 122/86 | HR 120 | Ht 63.0 in | Wt 153.0 lb

## 2023-11-04 DIAGNOSIS — Z23 Encounter for immunization: Secondary | ICD-10-CM

## 2023-11-04 DIAGNOSIS — Z79899 Other long term (current) drug therapy: Secondary | ICD-10-CM

## 2023-11-04 DIAGNOSIS — R112 Nausea with vomiting, unspecified: Secondary | ICD-10-CM

## 2023-11-04 DIAGNOSIS — R600 Localized edema: Secondary | ICD-10-CM

## 2023-11-04 DIAGNOSIS — E43 Unspecified severe protein-calorie malnutrition: Secondary | ICD-10-CM

## 2023-11-04 DIAGNOSIS — I82890 Acute embolism and thrombosis of other specified veins: Secondary | ICD-10-CM

## 2023-11-04 DIAGNOSIS — I864 Gastric varices: Secondary | ICD-10-CM

## 2023-11-04 DIAGNOSIS — F1011 Alcohol abuse, in remission: Secondary | ICD-10-CM

## 2023-11-04 DIAGNOSIS — K863 Pseudocyst of pancreas: Secondary | ICD-10-CM

## 2023-11-04 DIAGNOSIS — K860 Alcohol-induced chronic pancreatitis: Secondary | ICD-10-CM

## 2023-11-04 DIAGNOSIS — R6881 Early satiety: Secondary | ICD-10-CM

## 2023-11-04 DIAGNOSIS — R1084 Generalized abdominal pain: Secondary | ICD-10-CM

## 2023-11-04 DIAGNOSIS — R188 Other ascites: Secondary | ICD-10-CM

## 2023-11-04 DIAGNOSIS — E876 Hypokalemia: Secondary | ICD-10-CM

## 2023-11-04 MED ORDER — FUROSEMIDE 20 MG PO TABS: 20.0000 mg | ORAL_TABLET | Freq: Every day | ORAL | 2 refills | Status: DC

## 2023-11-04 MED ORDER — SPIRONOLACTONE 50 MG PO TABS: 50.0000 mg | ORAL_TABLET | Freq: Every day | ORAL | 2 refills | Status: DC

## 2023-11-04 MED ORDER — PANCRELIPASE (LIP-PROT-AMYL) 36000-114000 UNITS PO CPEP: 72000.0000 [IU] | ORAL_CAPSULE | Freq: Three times a day (TID) | ORAL | 3 refills | Status: AC

## 2023-11-04 NOTE — Telephone Encounter (Signed)
Nurses Patient recently in the hospital with significant gastro disease as well as liver issues she does need to do a follow-up within the next 2 weeks with me also She needs to do CBC and metabolic 7 in 1 week's time due to high risk medication, hypocalcemia, anemia of chronic disease  Thanks-Dr. Lorin Picket

## 2023-11-04 NOTE — Patient Instructions (Addendum)
We have sent the following medications to your pharmacy for you to pick up at your convenience: Lasix  Creon  Aldactone    We have you scheduled for a follow up on 02/06/2024 at 8:40am with dr Myrtie Neither   _______________________________________________________  If your blood pressure at your visit was 140/90 or greater, please contact your primary care physician to follow up on this.  _______________________________________________________  If you are age 60 or older, your body mass index should be between 23-30. Your Body mass index is 27.1 kg/m. If this is out of the aforementioned range listed, please consider follow up with your Primary Care Provider.  If you are age 110 or younger, your body mass index should be between 19-25. Your Body mass index is 27.1 kg/m. If this is out of the aformentioned range listed, please consider follow up with your Primary Care Provider.   ________________________________________________________  The [place] [facility] providers would like to encourage you to use [facility] to communicate with providers for non-urgent requests or questions.  Due to long hold times on the telephone, sending your provider a message by [facility] may be a faster and more efficient way to get a response.  Please allow 48 business hours for a response.  Please remember that this is for non-urgent requests.  _______________________________________________________ It was a pleasure to see you today!  Thank you for trusting me with your gastrointestinal care!

## 2023-11-04 NOTE — Transitions of Care (Post Inpatient/ED Visit) (Unsigned)
   11/04/2023  Name: FLECIA GREMMINGER MRN: [id number] DOB: 1976-10-21  Today's TOC FU Call Status: Today's TOC FU Call Status:: Unsuccessful Call (2nd Attempt) Unsuccessful Call (1st Attempt) Date: 10/31/23 Unsuccessful Call (2nd Attempt) Date: 11/04/23  Attempted to reach the patient regarding the most recent Inpatient/ED visit.  Follow Up Plan: Additional outreach attempts will be made to reach the patient to complete the Transitions of Care (Post Inpatient/ED visit) call.   Signature Karena Addison, LPN [facility] Nurse Health Advisor Direct Dial [phone]

## 2023-11-04 NOTE — Progress Notes (Signed)
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  Alcohol-related fatty liver.  She had ongoing bloating, early satiety, nausea weight loss, and upper endoscopy was done December 2022 which revealed extensive gastric varices along the fundus into the proximal gastric body.  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 She was referred to Dr. Sophronia Simas of hepatobiliary surgery shortly thereafter for consideration of any role for splenectomy to decompress these varices.  Dr. Freida Busman felt that the risk benefit ratio did not favor that in the benefit of such surgery would be uncertain.  She was reportedly going to bring the case to multidisciplinary conference and inquire if this issue may be amenable to any interventional radiology procedures. I subsequently recommended a gastric emptying study (holding Suboxone 5 days prior)  Chart notes indicate patient was contacted about that but had lost her insurance and had significant social stressors and did not she could pursue that at the time.  Sharine was recently hospitalized for 2 to 3 months of progressive epigastric pain nausea vomiting early satiety with peripheral edema and hypoalbuminemia.  Imaging showed slight enlargement of the pancreatic pseudocyst (2.9 cm).  Discharge summary indicates surgical consult was obtained from Dr. Freida Busman and no further intervention on the cyst  was recommended.  Discussed the use of AI scribe software for clinical note transcription with the patient, who gave verbal consent to proceed.  History of Present Illness    Her mother was present for the entire visit today.  The patient, with a history of chronic pancreatitis, presents with persistent bloating and a sensation of fullness, likened to being 'nine months pregnant.' She reports difficulty sleeping due to discomfort when lying flat on her back. The patient has experienced significant improvement in vomiting since her recent hospital stay, attributing this to dietary changes and medication. She is currently on a soft diet and has been able to keep food down.  In addition to the digestive issues, the patient reports bilateral lower extremity swelling and balance issues, describing a sensation of swaying when her eyes are closed. She has difficulty ascending stairs without the aid of a railing and reports a feeling of weakness, as if she cannot bear her own weight. She also reports cold extremities and a tendency for her knees to buckle, leading to falls.  The patient's bowel movements have normalized after an initial period of diarrhea when reintroducing food during the hospitalization. She reports periodic urgency when needing to use the bathroom.  Stools not fatty/greasy or floating, and have lately been formed.  The patient has abstained from alcohol for a couple of months, having weaned herself down to one or two drinks a day prior to complete cessation. She reports that her abdominal pain began after she stopped drinking.   She continues to have fairly generalized abdominal pain though more prominent in the upper abdomen that has persisted since hospital discharge.   Carlei has not yet made an appointment to reestablish care with her primary care provider, and said she wanted to  see me first to get her pancreas and abdominal issues attended to.  ROS:  Review of Systems   Constitutional:  Positive for appetite change and unexpected weight change.  HENT:  Negative for mouth sores and voice change.   Eyes:  Negative for pain and redness.  Respiratory:  Positive for shortness of breath. Negative for cough.   Cardiovascular:  Negative for chest pain and palpitations.  Genitourinary:  Negative for dysuria and hematuria.  Musculoskeletal:  Negative for arthralgias and myalgias.  Skin:  Negative for pallor and rash.  Neurological:  Positive for weakness. Negative for headaches.  Hematological:  Negative for adenopathy.     Past Medical History: Past Medical History:  Diagnosis Date   Acne rosacea    Alcohol abuse    Allergy    Anxiety    Bipolar 1 disorder (HCC)    starting to hear voices   GERD (gastroesophageal reflux disease)    Hypertension    MVP (mitral valve prolapse)    Pancreatic pseudocyst    Pancreatitis    Seizures (HCC) 2009   stress induced     Past Surgical History: Past Surgical History:  Procedure Laterality Date   LAPAROSCOPIC TUBAL LIGATION  11/04/2012   Procedure: LAPAROSCOPIC TUBAL LIGATION;  Surgeon: Lazaro Arms, MD;  Location: [facility];  Service: Gynecology;  Laterality: Bilateral;   LEEP     NASAL SINUS SURGERY       Family History: Family History  Problem Relation Age of Onset   Hypertension Mother    Colon cancer Neg Hx    Colon polyps Neg Hx    Esophageal cancer Neg Hx    Rectal cancer Neg Hx    Stomach cancer Neg Hx     Social History: Social History   Socioeconomic History   Marital status: Single    Spouse name: Not on file   Number of children: Not on file   Years of education: Not on file   Highest education level: Not on file  Occupational History   Not on file  Tobacco Use   Smoking status: Former    Current packs/day: 0.00    Types: Cigarettes    Start date: 08/23/2007    Quit date: 08/22/2017    Years since quitting: 6.2   Smokeless tobacco: Never  Vaping Use   Vaping status: Former    Quit date: 06/21/2020  Substance and Sexual Activity   Alcohol use: Not Currently    Comment: 1-3 drinks per week-REPORTED 11/20/21 STOPPED MONTH AGO"   Drug use: No   Sexual activity: Yes    Birth control/protection: Surgical  Other Topics Concern   Not on file  Social History Narrative   Not on file   Social Determinants of Health   Financial Resource Strain: Not on file  Food Insecurity: No Food Insecurity (10/26/2023)   Hunger Vital Sign    Worried About Running Out of Food in the Last Year: Never true    Ran Out of Food in the Last Year: Never true  Transportation Needs: No Transportation Needs (10/26/2023)   PRAPARE - Administrator, Civil Service (Medical): No    Lack of Transportation (Non-Medical): No  Physical Activity: Not on file  Stress: Not on file  Social Connections: Not on file    Allergies: Allergies  Allergen Reactions   Vicoprofen [Hydrocodone-Ibuprofen] Nausea Only   Doxycycline Nausea And Vomiting   Morphine And Codeine Itching    Outpatient Meds: Current Outpatient  Medications  Medication Sig Dispense Refill   acetaminophen (TYLENOL) 500 MG tablet Take 500 mg by mouth every 6 (six) hours as needed for moderate pain.     furosemide (LASIX) 20 MG tablet Take 1 tablet (20 mg total) by mouth daily. 30 tablet 2   HYDROcodone-acetaminophen (NORCO/VICODIN) 5-325 MG tablet Take 1 tablet by mouth every 6 (six) hours as needed for moderate pain (pain score 4-6). 30 tablet 0   lipase/protease/amylase (CREON) 36000 UNITS CPEP capsule Take 2 capsules (72,000 Units total) by mouth 3 (three) times daily before meals. 270 capsule 3   omeprazole (PRILOSEC) 40 MG capsule Take 1 capsule (40 mg total) by mouth daily. 30 capsule 2   ondansetron (ZOFRAN) 4 MG tablet Take 1 tablet (4 mg total) by mouth every 8 (eight) hours as needed. 30 tablet 2   spironolactone (ALDACTONE) 50 MG tablet Take 1 tablet (50 mg total) by mouth daily. 30 tablet 2   No current  facility-administered medications for this visit.      ___________________________________________________________________ Objective   Exam:  BP 122/86 (BP Location: Left Arm, Patient Position: Sitting, Cuff Size: Normal)   Pulse (!) 120   Ht 5\' 3"  (1.6 m)   Wt 153 lb (69.4 kg)   LMP 08/24/2023 (Approximate)   BMI 27.10 kg/m  Wt Readings from Last 3 Encounters:  11/04/23 153 lb (69.4 kg)  10/30/23 154 lb 1.6 oz (69.9 kg)  02/28/23 166 lb (75.3 kg)   Chronically ill-appearing, poor muscle mass, blunted affect.  Gets on exam table without assistance  Eyes: sclera anicteric, no redness ENT: oral mucosa moist without lesions, no cervical or supraclavicular lymphadenopathy CV: Regular and tachycardic, no JVD, + bilateral pretibial edema Resp: clear to auscultation bilaterally, normal RR and effort noted GI: soft, generalized but predominantly upper tenderness, with active bowel sounds. No guarding or palpable organomegaly noted, though examination of that somewhat limited by tenderness Skin; warm and dry, no rash or jaundice noted.  Rosacea Neuro: awake, alert and oriented x 3. Normal gross motor function and fluent speech Slow gait  Data:     Latest Ref Rng & Units 10/28/2023    7:15 AM 10/27/2023    3:56 AM 10/26/2023    5:08 AM  CBC  WBC 4.0 - 10.5 K/uL 4.2  2.6  3.2   Hemoglobin 12.0 - 15.0 g/dL 10.2  9.6  9.2   Hematocrit 36.0 - 46.0 % 31.0  30.1  28.1   Platelets 150 - 400 K/uL 228  202  199       Latest Ref Rng & Units 10/29/2023    5:58 AM 10/28/2023    7:15 AM 10/27/2023    3:56 AM  CMP  Glucose 70 - 99 mg/dL 98  725  57   BUN 6 - 20 mg/dL 7  7  6    Creatinine 0.44 - 1.00 mg/dL 3.66  4.40  3.47   Sodium 135 - 145 mmol/L 135  132  136   Potassium 3.5 - 5.1 mmol/L 4.1  3.2  3.7   Chloride 98 - 111 mmol/L 103  99  103   CO2 22 - 32 mmol/L 27  23  21    Calcium 8.9 - 10.3 mg/dL 7.7  7.6  7.4   Total Protein 6.5 - 8.1 g/dL  5.7  5.6   Total Bilirubin <1.2 mg/dL   1.4  1.7   Alkaline Phos 38 - 126 U/L  125  119   AST 15 -  41 U/L  42  43   ALT 0 - 44 U/L  23  21    Albumin was 2.0 on admission, dropped to 1.6 afterward  Lipase nml on 10/25/23  CLINICAL DATA:  Low to intermediate probability pulmonary embolism, positive D-dimer, unspecified abdominal pain, pancreatitis, peripheral edema.   EXAM: CT ANGIOGRAPHY CHEST   CT ABDOMEN AND PELVIS WITH CONTRAST   TECHNIQUE: Multidetector CT imaging of the chest was performed using the standard protocol during bolus administration of intravenous contrast. Multiplanar CT image reconstructions and MIPs were obtained to evaluate the vascular anatomy. Multidetector CT imaging of the abdomen and pelvis was performed using the standard protocol during bolus administration of intravenous contrast.   RADIATION DOSE REDUCTION: This exam was performed according to the departmental dose-optimization program which includes automated exposure control, adjustment of the mA and/or kV according to patient size and/or use of iterative reconstruction technique.   CONTRAST:  75mL OMNIPAQUE IOHEXOL 350 MG/ML SOLN   COMPARISON:  CT abdomen pelvis 09/10/2021   FINDINGS: CTA CHEST FINDINGS   Cardiovascular: Adequate opacification of the pulmonary arterial tree. No intraluminal filling defect identified to suggest acute pulmonary embolism. Central pulmonary arteries are of normal caliber. No significant coronary artery calcification. Cardiac size within normal limits. No pericardial effusion. No significant atherosclerotic calcification within the thoracic aorta. No aortic aneurysm.   Mediastinum/Nodes: No enlarged mediastinal, hilar, or axillary lymph nodes. Thyroid gland, trachea, and esophagus demonstrate no significant findings.   Lungs/Pleura: Lungs are clear. No pleural effusion or pneumothorax.   Musculoskeletal: No acute bone abnormality. Mild pectus excavatum deformity. No lytic or blastic bone  lesion.   Review of the MIP images confirms the above findings.   CT ABDOMEN and PELVIS FINDINGS   Hepatobiliary: Severe, heterogeneous hepatic steatosis. No enhancing intrahepatic mass. No intra or extrahepatic biliary ductal dilation. Gallbladder unremarkable.   Pancreas: The pancreas is atrophic, progressive since prior examination, with development of multiple peripancreatic fluid collections. 2.9 cm collection adjacent to the pancreatic tail appears more organized with a thin wall, uniform low attenuation of the internal contents and no significant surrounding inflammatory stranding is in keeping with a peripancreatic pseudocyst. More centrally adjacent to the body of the pancreas and just superior to the celiac axis within the gastrohepatic ligament is a more inflammatory collection demonstrating surrounding inflammatory stranding measuring 2.6 x 3.0 cm at axial image # 29/3. This may represent an acute peripancreatic inflammatory collection or reflect superimposed infection of a pre-existing peripancreatic fluid collection. The pancreatic duct is not dilated. No evidence of frank parenchymal necrosis.   Spleen: Normal in size.  No intrasplenic lesion identified.   Adrenals/Urinary Tract: Adrenal glands are unremarkable. Kidneys are normal, without renal calculi, focal lesion, or hydronephrosis. Bladder is unremarkable.   Stomach/Bowel: Moderate ascites the stomach, small bowel, and large bowel are unremarkable. No evidence of obstruction or focal inflammation. No free intraperitoneal gas. Appendix normal.   Vascular/Lymphatic: The splenic vein is chronically thrombosed with numerous gastroepiploic collaterals identified. Superior mesenteric vein and portal vein are patent. The abdominal vasculature is otherwise unremarkable. No pathologic adenopathy within the abdomen and pelvis.   Reproductive: Uterus and bilateral adnexa are unremarkable.   Other: No abdominal wall  hernia   Musculoskeletal: No pathologic adenopathy within the abdomen and pelvis. No lytic or blastic bone lesion.   Review of the MIP images confirms the above findings.   IMPRESSION: 1. No pulmonary embolism. No acute intrathoracic pathology identified. 2. Progressive pancreatic atrophy with development of multiple  peripancreatic fluid collections. 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 More centrally adjacent to the body of the pancreas and just superior to the celiac axis within the gastrohepatic ligament is a more inflammatory collection demonstrating surrounding inflammatory stranding measuring 2.6 x 3.0 cm. This may represent an acute peripancreatic inflammatory collection or reflect superimposed infection of a pre-existing peripancreatic fluid collection. 3. Severe, heterogeneous hepatic steatosis. 4. Moderate ascites. 5. 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     Electronically Signed   By: Helyn Numbers M.D.   On: 10/26/2023 01:49     Encounter Diagnoses  Name Primary?   Alcohol-induced chronic pancreatitis (HCC) Yes   Generalized abdominal pain    Other ascites    Severe protein-calorie malnutrition (HCC)    Peripheral edema    Nausea and vomiting in adult    History of alcohol abuse    Pancreatic pseudocyst    Early satiety    Gastric varices    Splenic vein thrombosis    Reni has had a significant decompensation of her health status since I last saw her with a recent hospitalization due to chronic relapsing pancreatitis.  I am glad that she finally became abstinent from alcohol about 2 months ago, though she was already suffering from pancreatitis at that time that I think continued to escalate to the point of hospitalization.  She has severe protein calorie malnutrition with marked hypoalbuminemia that is contributing to ascites and peripheral edema.  While I am sure her liver has  suffered at least a significant fatty liver effect, it is difficult to know at this juncture if there is truly cirrhosis.  She does not have definite cirrhotic changes on CT scan, but the liver does appear enlarged and fatty change.  Chronic splenic vein thrombosis that has caused large gastric varices seen on EGD 2 years ago. She remains at high risk for life-threatening upper GI bleeding as a result of this.  Fortunately, her nausea and vomiting has subsided and she is only needed the ondansetron a few times since hospital discharge.  I congratulated her on alcohol abstinence and encouraged her to continue that as the most important long-term health maintenance issue.  The extent to which her pancreatic function may recover can only be told with time.  But with absolute certainty, the relapsing pancreatitis and resultant pseudocyst and peripancreatic edema need abstinence and time to have a chance for improvement.  Fortunately, no imaging findings of bowel obstruction or gastric outlet obstruction from these pancreatic processes.  Assessment and Plan    Chronic Pancreatitis Recent hospitalization with improvement in vomiting. Persistent bloating and fullness. Discussed the role of alcohol in pancreatitis and the importance of abstinence. -Consider pancreatic enzyme supplements to aid digestion and rest the pancreas. Will research cost-effective options and attempt to obtain samples if needed.  She requested pain medication, which I politely declined to do because of my concerns for dependency on a patient with a history of chemical dependency.  Fluid Overload (Ascites and Edema) Noted bloating, fullness, and lower extremity swelling. Discussed the role of sodium in fluid retention. -Start low-dose diuretics to manage fluid overload.  Spironolactone 50 mg once daily, furosemide 20 mg once daily -Advised to limit sodium intake to less than 2 grams per day.  General Weakness and Shortness of  Breath Noted difficulty with mobility, balance, and exertion. -Recommend follow-up with primary care physician for further evaluation, including potential echocardiogram to assess cardiac function.  Sounds  like she may have some alcohol and perhaps malnutrition induced weakness and perhaps neuropathy.  I expect they will want to do some further labs when they see her.  If so, then that would be a good opportunity for repeat BMP to ensure stability of renal function and electrolytes after starting diuretics today.  She is not currently in condition for consideration of any interventional radiologic procedures (if any are technically feasible) for her gastric varices. Current lack of insurance coverage presents definite challenges in care management, but we will do the best we can with this.  She says she is applying for Medicaid and might seek assistance from primary care and/or social work in that regard.  I am plan to see her back in about 2 months, but she will contact us sooner if needed.   70 minutes were spent on this encounter (including chart review, history/exam, counseling/coordination of care, and documentation) > 50% of that time was spent on counseling and coordination of care.   Charlie Pitter III  CC: Referring provider noted above

## 2023-11-05 ENCOUNTER — Telehealth: Payer: Self-pay | Admitting: Gastroenterology

## 2023-11-05 NOTE — Telephone Encounter (Addendum)
Inbound call from patient, states Creon is $4,500 for a 50 day supply, would like to try samples. She also would like a work note for yesterday's visit.

## 2023-11-06 NOTE — Transitions of Care (Post Inpatient/ED Visit) (Signed)
   11/06/2023  Name: TERICA TWYMAN MRN: [id number] DOB: 08-15-1976  Today's TOC FU Call Status: Today's TOC FU Call Status:: Unsuccessful Call (3rd Attempt) Unsuccessful Call (1st Attempt) Date: 10/31/23 Unsuccessful Call (2nd Attempt) Date: 11/04/23 Unsuccessful Call (3rd Attempt) Date: 11/06/23  Attempted to reach the patient regarding the most recent Inpatient/ED visit.  Follow Up Plan: No further outreach attempts will be made at this time. We have been unable to contact the patient.  Signature Karena Addison, LPN [facility] Nurse Health Advisor Direct Dial [phone]

## 2023-11-17 ENCOUNTER — Ambulatory Visit (INDEPENDENT_AMBULATORY_CARE_PROVIDER_SITE_OTHER): Payer: Self-pay | Admitting: Family Medicine

## 2023-11-17 ENCOUNTER — Encounter: Payer: Self-pay | Admitting: Family Medicine

## 2023-11-17 VITALS — BP 118/80 | HR 98 | Temp 97.5°F | Ht 63.0 in | Wt 146.0 lb

## 2023-11-17 DIAGNOSIS — R600 Localized edema: Secondary | ICD-10-CM

## 2023-11-17 MED ORDER — FUROSEMIDE 20 MG PO TABS: ORAL_TABLET | ORAL | 5 refills | Status: DC

## 2023-11-17 NOTE — Progress Notes (Signed)
Subjective:    Patient ID: Brittany [name], female    DOB: 1976-04-07, 47 y.o.   MRN: [id number]  Discussed the use of AI scribe software for clinical note transcription with the patient, who gave verbal consent to proceed.  History of Present Illness   The patient presents with persistent lower leg swelling that does not improve overnight. She reports increased pain upon walking. The patient is currently on a diuretic, which results in frequent urination. Despite this, the swelling persists.  The patient also reports shortness of breath and decreased stamina, particularly when performing tasks such as grocery shopping and climbing stairs. She attributes this to overall weakness rather than respiratory distress.  The patient is currently living with her daughter and is in the process of moving. She reports adherence to dietary recommendations from the hospital, consuming small, frequent meals as she gets full quickly. She has noticed some weight loss.  The patient has a history of pancreas and liver issues, which are being managed by a gastroenterologist. She reports that recovery is a slow process and she is in the process of obtaining samples of Creon, a medication to aid digestion. However, the cost of the medication is a concern.  The patient is currently employed in a customer service role, which accommodates her need to sit due to inability to stand for long periods. She expresses concern about maintaining her employment due to her health issues. She is considering applying for Medicaid to help cover medical costs.  The patient reports that she has support stockings but does not wear them consistently as they cause itching and displacement of the swelling.  The patient is also dealing with frequent urination due to her diuretic medication. She reports no issues with access to bathroom facilities at work. She is considering increasing her diuretic dosage to help manage the  swelling.  The patient is abstaining from alcohol and is making efforts to improve her overall health.         Review of Systems     Objective:    Physical Exam   MEASUREMENTS: WT- 146 pounds CHEST: Lungs clear. CARDIOVASCULAR: Heart sounds normal. EXTREMITIES: Bilateral lower extremity edema, one leg more prominently swollen.     Patient with significant pedal edema in the lower legs right leg slightly more swollen toward the upper part of the leg compared to the left no tenderness in the calf do not feel that this is a blood clot      Assessment & Plan:  Assessment and Plan    Lower Extremity Edema Persistent despite diuretic use. Likely multifactorial due to low albumin from liver disease and malnutrition. -Increase Furosemide to twice daily (morning and lunchtime). -Encourage leg elevation, especially at night. -Consider use of compression stockings.  Malnutrition Patient reports small frequent meals due to early satiety. Recent weight loss noted. Low albumin likely contributing to edema. -Continue small frequent meals. -Encourage high protein diet. -Plan to start pancreatic enzyme supplement (Creon) once insurance is obtained.  Liver Disease Contributing to low albumin and edema. Patient reports abstinence from alcohol. -Continue abstinence from alcohol. -Plan for further workup once insurance is obtained.  General Health Maintenance -Encourage patient to apply for Medicaid to assist with medication costs and further workup. -Check kidney function and potassium levels due to increased Furosemide dose. -Follow-up appointment in March, sooner if condition worsens. -Encourage use of MyChart for communication and updates.     Her pedal edema is due to the low albumin patient has  significant liver and pancreatic issues which are affecting nutritional status Concerning that at some point this may cause serious issues Adjust diuretic to twice a day follow-up metabolic  7 within the next 7 to 10 days May well need potassium but is on spironolactone currently Patient needs to get Medicaid to help her with the cost of lab work as well as medications

## 2023-11-18 NOTE — Telephone Encounter (Signed)
Inbound call from patient, requesting more samples of Creon.

## 2023-12-02 ENCOUNTER — Telehealth: Payer: Self-pay | Admitting: Gastroenterology

## 2023-12-02 NOTE — Telephone Encounter (Signed)
PT is calling to ask if she can pickup Creon samples. Please advise.

## 2023-12-15 ENCOUNTER — Telehealth: Payer: Self-pay | Admitting: Gastroenterology

## 2023-12-15 NOTE — Telephone Encounter (Signed)
Patient wants to know if we have any samples of Creon med

## 2023-12-18 NOTE — Telephone Encounter (Signed)
Called patient and advised. Patient aware.

## 2023-12-18 NOTE — Telephone Encounter (Signed)
Patient f/u on previous note. Please advise.

## 2023-12-31 ENCOUNTER — Telehealth: Payer: Self-pay | Admitting: Gastroenterology

## 2023-12-31 NOTE — Telephone Encounter (Signed)
 Inbound call from patient stating that she needs more samples of creon. Please advise.

## 2024-01-09 NOTE — Telephone Encounter (Signed)
Patient called to request more Creon samples if possible.

## 2024-01-27 ENCOUNTER — Telehealth: Payer: Self-pay | Admitting: Gastroenterology

## 2024-01-27 NOTE — Telephone Encounter (Signed)
Patient called and stated that she was wondering of Dr. Myrtie Neither has gotten those samples of Keron. Patient stated its been about 2 weeks and she has not heard anything from our office. Patient is requesting a call back. Please advise.

## 2024-02-01 ENCOUNTER — Other Ambulatory Visit: Payer: Self-pay | Admitting: Gastroenterology

## 2024-02-06 ENCOUNTER — Encounter: Payer: Self-pay | Admitting: Gastroenterology

## 2024-02-06 ENCOUNTER — Ambulatory Visit (INDEPENDENT_AMBULATORY_CARE_PROVIDER_SITE_OTHER): Payer: Self-pay | Admitting: Gastroenterology

## 2024-02-06 VITALS — BP 118/78 | HR 104 | Ht 63.0 in | Wt 152.4 lb

## 2024-02-06 DIAGNOSIS — I864 Gastric varices: Secondary | ICD-10-CM

## 2024-02-06 DIAGNOSIS — K860 Alcohol-induced chronic pancreatitis: Secondary | ICD-10-CM

## 2024-02-06 DIAGNOSIS — R600 Localized edema: Secondary | ICD-10-CM

## 2024-02-06 DIAGNOSIS — F1011 Alcohol abuse, in remission: Secondary | ICD-10-CM

## 2024-02-06 DIAGNOSIS — K863 Pseudocyst of pancreas: Secondary | ICD-10-CM

## 2024-02-06 DIAGNOSIS — I82890 Acute embolism and thrombosis of other specified veins: Secondary | ICD-10-CM

## 2024-02-06 NOTE — Patient Instructions (Addendum)
Zenpep- Please take one capsule every meal.   _______________________________________________________  If your blood pressure at your visit was 140/90 or greater, please contact your primary care physician to follow up on this.  _______________________________________________________  If you are age 47 or older, your body mass index should be between 23-30. Your Body mass index is 26.99 kg/m. If this is out of the aforementioned range listed, please consider follow up with your Primary Care Provider.  If you are age 55 or younger, your body mass index should be between 19-25. Your Body mass index is 26.99 kg/m. If this is out of the aformentioned range listed, please consider follow up with your Primary Care Provider.   ________________________________________________________  The  GI providers would like to encourage you to use [facility] to communicate with providers for non-urgent requests or questions.  Due to long hold times on the telephone, sending your provider a message by [facility] may be a faster and more efficient way to get a response.  Please allow 48 business hours for a response.  Please remember that this is for non-urgent requests.  _______________________________________________________ It was a pleasure to see you today!  Thank you for trusting me with your gastrointestinal care!

## 2024-02-06 NOTE — Progress Notes (Signed)
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  Alcohol-related fatty liver.  She had ongoing bloating, early satiety, nausea weight loss, and upper endoscopy was done December 2022 which revealed extensive gastric varices along the fundus into the proximal gastric body.  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 She was referred to Dr. Sophronia Simas of hepatobiliary surgery shortly thereafter for consideration of any role for splenectomy to decompress these varices.  Dr. Freida Busman felt that the risk benefit ratio did not favor that in the benefit of such surgery would be uncertain.  She was reportedly going to bring the case to multidisciplinary conference and inquire if this issue may be amenable to any interventional radiology procedures. I subsequently recommended a gastric emptying study (holding Suboxone 5 days prior)  Chart notes indicate patient was contacted about that but had lost her insurance and had significant social stressors and did not she could pursue that at the time.   Macy was hospitalized Nov 2024 for 2 to 3 months of progressive epigastric pain nausea vomiting early satiety with peripheral edema and hypoalbuminemia.  Imaging showed slight enlargement of the pancreatic pseudocyst (2.9 cm).  Discharge summary indicates surgical consult was obtained from Dr. Freida Busman and no further intervention on the cyst was recommended.  Office visit 11/04/2023 with ongoing diffuse but primarily upper abdominal pain bloating lack of appetite, weakness dizziness with marked hypoalbuminemia and protein calorie malnutrition causing peripheral edema. Request for pain medication declined.  Pancreatic enzymes recommended, patient unable to afford them with lack  of medical coverage.  We have not been able to obtain any samples of those meds since then. She reestablished primary care with Dr.Luking on 11/17/2023 who made adjustments to her furosemide and spironolactone dosing. ____________________  Merry Proud was here with her mother, who was present for the entire visit.  She is glad to report feeling much better since I last saw her, and her mother corroborates that.  She took some pancreatic enzymes samples that we had for about a month and noticed great reduction in abdominal pain bloating and nausea along with increased appetite and weight gain.  She has been out of them for about a month and has some digestive upset but not as severe as when seen several months ago. Reports her bowel habits are regular, denies greasy or loose stool, even off the enzymes. Still feels somewhat weak, has to be slow when standing so she does not get dizzy.  Has been able to work, and says she believes she will be off her medical insurance soon.  She has been working with her HR person to find the right plan. No bloody or black stool  ROS: Cardiovascular:  no chest pain Respiratory: no dyspnea Remainder of systems negative except as above The patient's Past Medical, Family and Social History were reviewed and are on file in the EMR. Past Medical History:  Diagnosis Date   Acne rosacea    Alcohol abuse    Allergy    Anxiety    Bipolar 1 disorder (HCC)    starting to hear voices   GERD (gastroesophageal reflux disease)    Hypertension    MVP (mitral valve prolapse)    Pancreatic pseudocyst    Pancreatitis    Seizures (HCC) 2009   stress induced  Past Surgical History:  Procedure Laterality Date   LAPAROSCOPIC TUBAL LIGATION  11/04/2012   Procedure: LAPAROSCOPIC TUBAL LIGATION;  Surgeon: Lazaro Arms, MD;  Location: [facility];  Service: Gynecology;  Laterality: Bilateral;   LEEP     NASAL SINUS SURGERY       Objective:  Med list reviewed  Current  Outpatient Medications:    acetaminophen (TYLENOL) 500 MG tablet, Take 500 mg by mouth every 6 (six) hours as needed for moderate pain., Disp: , Rfl:    furosemide (LASIX) 20 MG tablet, One in the morning and one at lunch, Disp: 60 tablet, Rfl: 5   hydrOXYzine (VISTARIL) 25 MG capsule, Take 50 mg by mouth every 8 (eight) hours as needed., Disp: , Rfl:    lipase/protease/amylase (CREON) 36000 UNITS CPEP capsule, Take 2 capsules (72,000 Units total) by mouth 3 (three) times daily before meals., Disp: 270 capsule, Rfl: 3   omeprazole (PRILOSEC) 40 MG capsule, Take 1 capsule (40 mg total) by mouth daily., Disp: 30 capsule, Rfl: 2   ondansetron (ZOFRAN) 4 MG tablet, Take 1 tablet (4 mg total) by mouth every 8 (eight) hours as needed., Disp: 30 tablet, Rfl: 2   spironolactone (ALDACTONE) 50 MG tablet, Take 1 tablet by mouth once daily, Disp: 30 tablet, Rfl: 0   Vital signs in last 24 hrs: Vitals:   02/06/24 0851  BP: 118/78  Pulse: (!) 104   Wt Readings from Last 3 Encounters:  02/06/24 152 lb 6 oz (69.1 kg)  11/17/23 146 lb (66.2 kg)  11/04/23 153 lb (69.4 kg)    Physical Exam  Looks well, affect improved, mobility better HEENT: sclera anicteric, oral mucosa moist without lesions Neck: supple, no thyromegaly, JVD or lymphadenopathy Cardiac: Regular without appreciable murmur, trace ankle edema bilaterally Pulm: clear to auscultation bilaterally, normal RR and effort noted Abdomen: soft, nondistended, no tenderness, with active bowel sounds. No guarding or palpable hepatosplenomegaly. Skin; warm and dry, no jaundice or rash   Labs:  No new data since last visit here on 11/04/2023 ___________________________________________ Radiologic studies:   ____________________________________________ Other:   _____________________________________________   Encounter Diagnoses  Name Primary?   Alcohol-induced chronic pancreatitis (HCC) Yes   Peripheral edema    History of alcohol abuse     Pancreatic pseudocyst    Gastric varices    Splenic vein thrombosis     Assessment and Plan Clinically much improved since last visit with further alcohol abstinence and intermittent pancreatic enzyme supplement.  Her previously severe protein calorie malnutrition with albumin of 1.6 is most likely improved.  She had elevated LFTs there were also likely in the context of pancreatitis and history of alcohol abuse, suspect all of that is probably improved.  I had hoped to do a CMP today, but she is still uninsured and trying to control costs.  So she wanted to wait until her April primary care visit since she thinks Dr. Gerda Diss will be doing labs at that point as well.  Fortunately, we were able to save over a month of pancreatic enzyme supplements for her, and she dropped off some paperwork today to hopefully get Creon from the manufacturer. We gave her samples of Zenpep 60,000 unit capsules, take 1 with each meal, and the current supply should last her about 6 weeks.  Not clear how much exocrine pancreatic insufficiency there is at this point since she does not have bowel movements that suggest malabsorption, but she unquestionably has pancreatic damage and atrophy on imaging and  I think needs more support during her recovery. Probably a few months down the road we will have her stop pancreatic enzymes and see how she feels in regards to stomach pain, appetite and weight.  Pancreatic pseudocyst slightly larger on last imaging, most likely stable to hopefully improved by now.  Must hold off on further imaging for cost reasons at this point. She also is likely the need further abdominal/pelvic imaging when she gets her insurance and is able to see IR clinic for vascular mapping and see if there is any feasibility of an intervention that might deal with the gastric varices in the setting of a previously discovered splenic vein thrombosis.  Peripheral edema much improved, continue twice daily  furosemide and once daily spironolactone.  Follow-up with me TBD when she has her insurance, which she hopes to occur in the next few months.  35 minutes were spent on this encounter (including chart review, history/exam, counseling/coordination of care, and documentation) > 50% of that time was spent on counseling and coordination of care.   Charlie Pitter III

## 2024-02-26 ENCOUNTER — Other Ambulatory Visit: Payer: Self-pay | Admitting: Family Medicine

## 2024-03-05 ENCOUNTER — Telehealth: Payer: Self-pay

## 2024-03-05 ENCOUNTER — Other Ambulatory Visit: Payer: Self-pay | Admitting: Gastroenterology

## 2024-03-05 ENCOUNTER — Other Ambulatory Visit: Payer: Self-pay

## 2024-03-05 NOTE — Telephone Encounter (Signed)
 Called patient to confirm refill, patient also asked about creon assistance and was wondering the update, I gave creon a call and they still need proof of income, I notified patient and she said she will be faxing over proof of income to therm personally.

## 2024-03-15 ENCOUNTER — Other Ambulatory Visit: Payer: Self-pay | Admitting: Family Medicine

## 2024-03-15 NOTE — Telephone Encounter (Signed)
 Copied from CRM [id number]. Topic: Clinical - Medication Refill >> Mar 15, 2024  2:02 PM Shon Hale wrote: Most Recent Primary Care Visit:  Provider: Lilyan Punt A  Department: [facility] MED  Visit Type: HOSPITAL FU  Date: 11/17/2023  Medication: ondansetron (ZOFRAN) 4 MG tablet Requesting Dr. Gerda Diss send in rx.   Has the patient contacted their pharmacy? Yes Pharmacy informed patient they sent refill request and it was denied.   Is this the correct pharmacy for this prescription? Yes This is the patient's preferred pharmacy:  [facility] [address], [place] - [address] [place] [address] Phone: [phone] Fax: [phone]   Has the prescription been filled recently? No  Is the patient out of the medication? Yes  Has the patient been seen for an appointment in the last year OR does the patient have an upcoming appointment? Yes  Can we respond through MyChart? No  Agent: Please be advised that Rx refills may take up to 3 business days. We ask that you follow-up with your pharmacy.

## 2024-03-16 ENCOUNTER — Ambulatory Visit: Payer: Self-pay | Admitting: Family Medicine

## 2024-03-30 ENCOUNTER — Telehealth: Payer: Self-pay | Admitting: Gastroenterology

## 2024-03-30 NOTE — Telephone Encounter (Signed)
 Patient called to follow up on Creon assistance status.

## 2024-03-31 NOTE — Telephone Encounter (Signed)
 Patient called to follow up previous message and is also wanting refill for zenpep while waiting for Creon update. Please advise.

## 2024-03-31 NOTE — Telephone Encounter (Signed)
 Patient is calling back to get status on Creon medication submitted since March. Please advise.

## 2024-04-01 NOTE — Telephone Encounter (Signed)
 Spoke with My Brittany [name] counselor "Brittany [name]" and found that they have been reaching out to the patient for additional information utilizing her email.  I have spoke with the patient. She will call them at [phone] to give them the needed information.  Patient states she is in need of medication. Last had been given Brittany [name] which she has found to not be as effective for her as the Creon. Samples to the 2nd floor front desk for her to pick up.

## 2024-04-01 NOTE — Telephone Encounter (Signed)
 I called and informed patient that I will need to reach out to my abbive and check status of the application. Unfortunately the CMA who started the process no longer works here and I can not locate application. Patient voiced understanding. I advised patient that I would reach by out to her later this afternoon and let her know the status.

## 2024-04-07 ENCOUNTER — Other Ambulatory Visit: Payer: Self-pay | Admitting: Gastroenterology

## 2024-04-07 ENCOUNTER — Telehealth: Payer: Self-pay

## 2024-04-07 MED ORDER — OMEPRAZOLE 40 MG PO CPDR: 40.0000 mg | DELAYED_RELEASE_CAPSULE | Freq: Every day | ORAL | 2 refills | Status: DC

## 2024-04-07 NOTE — Telephone Encounter (Signed)
 Prescription Request  04/07/2024  LOV: Visit date not found  What is the name of the medication or equipment? omeprazole (PRILOSEC) 40 MG capsule (Expired)   Have you contacted your pharmacy to request a refill?  Which pharmacy would you like this sent to?  [facility] [address], [place] - [address] [place] [address] Phone: [phone] Fax: [phone]    Patient notified that their request is being sent to the clinical staff for review and that they should receive a response within 2 business days.   Please advise at Mobile [phone] (mobile)

## 2024-05-07 ENCOUNTER — Other Ambulatory Visit: Payer: Self-pay | Admitting: Gastroenterology

## 2024-05-07 NOTE — Telephone Encounter (Signed)
 Please let me know how many refills Sir, thank you.

## 2024-05-14 ENCOUNTER — Encounter: Payer: Self-pay | Admitting: Family Medicine

## 2024-05-14 ENCOUNTER — Ambulatory Visit (INDEPENDENT_AMBULATORY_CARE_PROVIDER_SITE_OTHER): Payer: Self-pay | Admitting: Family Medicine

## 2024-05-14 VITALS — BP 134/82 | HR 78 | Temp 97.8°F | Ht 63.0 in | Wt 153.6 lb

## 2024-05-14 DIAGNOSIS — G4700 Insomnia, unspecified: Secondary | ICD-10-CM

## 2024-05-14 DIAGNOSIS — D509 Iron deficiency anemia, unspecified: Secondary | ICD-10-CM

## 2024-05-14 DIAGNOSIS — R7989 Other specified abnormal findings of blood chemistry: Secondary | ICD-10-CM

## 2024-05-14 MED ORDER — HYDROXYZINE PAMOATE 25 MG PO CAPS: 25.0000 mg | ORAL_CAPSULE | Freq: Four times a day (QID) | ORAL | 5 refills | Status: AC | PRN

## 2024-05-14 MED ORDER — ONDANSETRON HCL 4 MG PO TABS: 4.0000 mg | ORAL_TABLET | Freq: Three times a day (TID) | ORAL | 4 refills | Status: DC | PRN

## 2024-05-14 MED ORDER — TRAZODONE HCL 50 MG PO TABS
25.0000 mg | ORAL_TABLET | Freq: Every evening | ORAL | 3 refills | Status: AC | PRN
Start: 2024-05-14 — End: ?

## 2024-05-16 NOTE — Progress Notes (Signed)
 Subjective:    Patient ID: Brittany [name], female    DOB: 08/10/1976, 48 y.o.   MRN: [id number]  HPI Discussed the use of AI scribe software for clinical note transcription with the patient, who gave verbal consent to proceed.  History of Present Illness   Brittany [name] is a 48 year old female who presents with sleep disturbances and fatigue.  She experiences significant sleep disturbances, characterized by difficulty staying asleep. She falls asleep using aids like melatonin, rain sounds, or ASMR, but wakes up after one to two hours and struggles to return to sleep. She has tried hydroxyzine  without significant improvement. Her sleep issues contribute to fatigue, particularly noticeable around 3 PM during her workday.  She works a 9 to [employer] job, which she finds overwhelming at times due to fatigue and the need to push through tiredness. She experiences increased urination frequency, which disrupts her sleep further. She also reports feeling winded easily and has a sensation of potentially passing out if she becomes too hot.  She has a history of pancreatic insufficiency and has been on Creon , which she finds effective. However, due to cost issues, she was switched to Zenpep , which was less effective. She is working with [employer] to manage the cost of Creon .  She experiences restless legs one to two nights a week, which compels her to get up and move. She has a history of iron deficiency, but recent tests indicated normal levels.  Her knees hurt significantly when using stairs, leading her to walk backwards or sideways to alleviate discomfort. She has moved into an apartment with 14 steps, which exacerbates her knee pain.  She maintains a healthy diet, avoids sodas, and drinks water and ice regularly. She does not consume alcohol.        Review of Systems     Objective:   Physical Exam  General-in no acute distress Eyes-no discharge Lungs-respiratory rate normal,  CTA CV-no murmurs,RRR Extremities skin warm dry no edema Neuro grossly normal Behavior normal, alert       Assessment & Plan:  Assessment and Plan    Sleep disturbance Chronic sleep disturbance with ineffective response to melatonin and hydroxyzine . Discussed non-habit-forming medications and potential side effects. - Prescribe low-dose medication to maintain sleep, starting with half a tablet at night, 45 minutes before bedtime. Increase to a full tablet if no improvement after a week. Discontinue if grogginess occurs. - Encourage her to report medication effectiveness via MyChart in a few weeks.  Restless legs syndrome Intermittent symptoms possibly linked to iron deficiency. - Order blood work to check iron levels.  Iron deficiency screening Frequent ice consumption suggests possible iron deficiency despite previous normal levels. - Order blood work to check iron levels.  Knee pain Chronic knee pain worsened by stairs. Discussed housing accommodation if symptoms persist. - Consider providing a doctor's note for housing accommodation if needed for a ground floor apartment.  Urinary urgency Increased urgency post-work without incontinence or other symptoms.  Pancreatic enzyme deficiency Managed with Creon , effective but costly. Zenpep  less effective. She is seeking cost assistance. - Continue working with [employer] for cost assistance with Creon .      1. Iron deficiency anemia, unspecified iron deficiency anemia type (Primary) Check lab work this may be contributing to restless legs - CBC with Differential - Iron, TIBC and Ferritin Panel  2. Elevated LFTs Patient staying away from alcohol trying to stay healthy check liver function - Hepatic function panel  3. Hypocalcemia History  of low calcium  check metabolic 7 - Basic Metabolic Panel  4. Insomnia, unspecified type Try trazodone  at nighttime if iron deficiency is not present - CBC with Differential  Follow-up 6  months

## 2024-05-27 ENCOUNTER — Telehealth: Payer: Self-pay | Admitting: Gastroenterology

## 2024-05-27 NOTE — Telephone Encounter (Signed)
 Pt informed of provider recommendation and states understanding. Zenpep  samples placed at 3rd floor front desk for pt to pick up.  Electronically signed:  Londell River, NCMA

## 2024-05-27 NOTE — Telephone Encounter (Signed)
 Inbound call from patient requesting to know if she is able to pick up Creon  samples. Requesting a call back. Please advise, thank you.

## 2024-05-27 NOTE — Telephone Encounter (Signed)
 We are nearly out of Creon  samples, but have a larger number of Zenpep  samples. Although Zenpep  is reportedly less effective than the Creon  for controlling pain or diarrhea, she will have to take "available for symptoms.  12 bottles of the Zenpep  40,000 unit samples, take 1 capsule with each meal to make them last.  Memory Staggers MD

## 2024-05-27 NOTE — Telephone Encounter (Signed)
 Good Morning Brittany [name] please see patient request.   Brittany [name] is Dr Dominic Friendly CMA. I know you guys probably did not know that, but for future reference. She is pretty awesome too. :)

## 2024-06-05 ENCOUNTER — Other Ambulatory Visit: Payer: Self-pay | Admitting: Family Medicine

## 2024-06-07 ENCOUNTER — Other Ambulatory Visit: Payer: Self-pay

## 2024-06-07 MED ORDER — FUROSEMIDE 20 MG PO TABS: ORAL_TABLET | ORAL | 5 refills | Status: DC

## 2024-07-02 ENCOUNTER — Other Ambulatory Visit: Payer: Self-pay | Admitting: Family Medicine

## 2024-07-26 ENCOUNTER — Other Ambulatory Visit: Payer: Self-pay

## 2024-07-26 ENCOUNTER — Encounter (HOSPITAL_COMMUNITY): Payer: Self-pay | Admitting: Emergency Medicine

## 2024-07-26 ENCOUNTER — Emergency Department (HOSPITAL_COMMUNITY)
Admission: EM | Admit: 2024-07-26 | Discharge: 2024-07-26 | Disposition: A | Discharge status: Home / Self Care | Payer: Self-pay | Attending: Emergency Medicine | Admitting: Emergency Medicine

## 2024-07-26 DIAGNOSIS — R1013 Epigastric pain: Secondary | ICD-10-CM | POA: Insufficient documentation

## 2024-07-26 DIAGNOSIS — I10 Essential (primary) hypertension: Secondary | ICD-10-CM | POA: Insufficient documentation

## 2024-07-26 DIAGNOSIS — R1010 Upper abdominal pain, unspecified: Secondary | ICD-10-CM

## 2024-07-26 DIAGNOSIS — R1012 Left upper quadrant pain: Secondary | ICD-10-CM | POA: Insufficient documentation

## 2024-07-26 DIAGNOSIS — R110 Nausea: Secondary | ICD-10-CM | POA: Insufficient documentation

## 2024-07-26 LAB — COMPREHENSIVE METABOLIC PANEL WITH GFR
ALT: 20 U/L (ref 0–44)
AST: 24 U/L (ref 15–41)
Albumin: 4.9 g/dL (ref 3.5–5.0)
Alkaline Phosphatase: 90 U/L (ref 38–126)
Anion gap: 18 — ABNORMAL HIGH (ref 5–15)
BUN: 16 mg/dL (ref 6–20)
CO2: 21 mmol/L — ABNORMAL LOW (ref 22–32)
Calcium: 10.2 mg/dL (ref 8.9–10.3)
Chloride: 95 mmol/L — ABNORMAL LOW (ref 98–111)
Creatinine, Ser: 0.69 mg/dL (ref 0.44–1.00)
GFR, Estimated: >60 mL/min (ref >60)
Glucose, Bld: 106 mg/dL — ABNORMAL HIGH (ref 70–99)
Potassium: 3.7 mmol/L (ref 3.5–5.1)
Sodium: 134 mmol/L — ABNORMAL LOW (ref 135–145)
Total Bilirubin: 1.1 mg/dL (ref 0.0–1.2)
Total Protein: 9.5 g/dL — ABNORMAL HIGH (ref 6.5–8.1)

## 2024-07-26 LAB — CBC
HCT: 36.9 % (ref 36.0–46.0)
Hemoglobin: 10.8 g/dL — ABNORMAL LOW (ref 12.0–15.0)
MCH: 20.3 pg — ABNORMAL LOW (ref 26.0–34.0)
MCHC: 29.3 g/dL — ABNORMAL LOW (ref 30.0–36.0)
MCV: 69.4 fL — ABNORMAL LOW (ref 80.0–100.0)
Platelets: 376 K/uL (ref 150–400)
RBC: 5.32 MIL/uL — ABNORMAL HIGH (ref 3.87–5.11)
RDW: 17.2 % — ABNORMAL HIGH (ref 11.5–15.5)
WBC: 6.7 K/uL (ref 4.0–10.5)
nRBC: 0 % (ref 0.0–0.2)

## 2024-07-26 LAB — URINALYSIS, ROUTINE W REFLEX MICROSCOPIC
Bilirubin Urine: NEGATIVE
Glucose, UA: NEGATIVE mg/dL
Hgb urine dipstick: NEGATIVE
Ketones, ur: 80 mg/dL — AB
Leukocytes,Ua: NEGATIVE
Nitrite: NEGATIVE
Protein, ur: NEGATIVE mg/dL
Specific Gravity, Urine: 1.018 (ref 1.005–1.030)
pH: 5 (ref 5.0–8.0)

## 2024-07-26 LAB — POC URINE PREG, ED: Preg Test, Ur: NEGATIVE

## 2024-07-26 LAB — LIPASE, BLOOD: Lipase: 24 U/L (ref 11–51)

## 2024-07-26 MED ORDER — LACTATED RINGERS IV BOLUS
1000.0000 mL | Freq: Once | INTRAVENOUS | Status: AC
  Administered 2024-07-26: 1000 mL via INTRAVENOUS

## 2024-07-26 MED ORDER — ONDANSETRON 4 MG PO TBDP
4.0000 mg | ORAL_TABLET | Freq: Three times a day (TID) | ORAL | 0 refills | Status: DC | PRN
Start: 2024-07-26 — End: 2024-08-25

## 2024-07-26 MED ORDER — OXYCODONE HCL 5 MG PO TABS: 5.0000 mg | ORAL_TABLET | Freq: Four times a day (QID) | ORAL | 0 refills | Status: DC | PRN

## 2024-07-26 MED ORDER — ONDANSETRON HCL 4 MG/2ML IJ SOLN
4.0000 mg | Freq: Once | INTRAMUSCULAR | Status: AC
  Administered 2024-07-26: 4 mg via INTRAVENOUS
  Filled 2024-07-26: qty 2

## 2024-07-26 MED ORDER — FENTANYL CITRATE (PF) 100 MCG/2ML IJ SOLN
50.0000 ug | Freq: Once | INTRAMUSCULAR | Status: AC
  Administered 2024-07-26: 50 ug via INTRAVENOUS
  Filled 2024-07-26: qty 2

## 2024-07-26 MED ORDER — OXYCODONE HCL 5 MG PO TABS
5.0000 mg | ORAL_TABLET | Freq: Once | ORAL | Status: AC
  Administered 2024-07-26: 5 mg via ORAL
  Filled 2024-07-26: qty 1

## 2024-07-26 NOTE — ED Notes (Signed)
 Patient tolerating 8 oz of water without nausea or vomiting

## 2024-07-26 NOTE — ED Triage Notes (Signed)
 Pt c/o pancreatitis flare up since last week

## 2024-07-26 NOTE — Discharge Instructions (Addendum)
 Your labs are reassuring today.  Your kidney, liver, and pancreas labs were normal today.  Your urine did not show any signs of infection.  Your pregnancy test was negative.  Your hemoglobin (one of your blood counts) was slightly low here today which can be a sign of an iron deficiency.  Please follow-up with your PCP regarding this for further evaluation and management.  Since you feel that this pain is the same as your previous pancreatitis flare, we will treat you for a pancreatitis flare.   You may take up to 1000mg  of tylenol  every 6 hours as needed for pain.  Do not take more then 4g per day.  You have been prescribed Oxycodone -this is a narcotic/controlled substance medication that has potential addicting qualities.  You may take 1 tablet every 6 hours as needed for severe pain.  Do not drive or operate heavy machinery when taking this medicine as it can be sedating. Do not drink alcohol or take other sedating medications when taking this medicine for safety reasons.  Keep this out of reach of small children.    You have been prescribed Zofran  (ondansetron ) for nausea and vomiting. You may take this every 8 hours as needed for nausea and vomiting. This medication dissolves under the tongue. You do not need to swallow it.    Please keep well-hydrated at home with water.  Maintain a liquid diet for the next day, then advance your diet with bland foods such as rice, toast, as tolerated.  Please follow-up with your GI doctor within the next 2 weeks for further evaluation.  Return to the ER for any worsening abdominal pain, uncontrolled vomiting, fevers, any other new or concerning symptoms

## 2024-07-26 NOTE — ED Provider Notes (Signed)
 [facility] DEPARTMENT AT [facility] Provider Note   CSN: [id number] Arrival date & time: 07/26/24  1028     Patient presents with: Abdominal Pain   Brittany [name] is a 48 y.o. female with history of hypertension, pancreatitis, presents with concern for epigastric and left upper quadrant abdominal pain that started last week.  Pain does radiate into the back.  States this feels exactly the same as her last pancreatitis flare.  Reports some associated nausea.  Has been having normal bowel movements.  Denies any fever or chills.  No chest pain or shortness of breath. She has tried controlling pain at home with Tylenol , but this is not improved her symptoms.  Reports this started after eating pizza which is a food that normally flares her pain.    Abdominal Pain      Prior to Admission medications   Medication Sig Start Date End Date Taking? Authorizing Provider  ondansetron  (ZOFRAN -ODT) 4 MG disintegrating tablet Take 1 tablet (4 mg total) by mouth every 8 (eight) hours as needed for nausea or vomiting. 07/26/24  Yes Veta Palma, PA-C  oxyCODONE  (ROXICODONE ) 5 MG immediate release tablet Take 1 tablet (5 mg total) by mouth every 6 (six) hours as needed for severe pain (pain score 7-10) or breakthrough pain (Pain not controlled with Tylenol ). 07/26/24  Yes Veta Palma, PA-C  acetaminophen  (TYLENOL ) 500 MG tablet Take 500 mg by mouth every 6 (six) hours as needed for moderate pain.    [provider]  furosemide  (LASIX ) 20 MG tablet One in the morning and one at lunch 06/07/24   Luking, Glendia LABOR, MD  hydrOXYzine  (VISTARIL ) 25 MG capsule Take 1 capsule (25 mg total) by mouth every 6 (six) hours as needed. 05/14/24   Alphonsa Glendia LABOR, MD  lipase/protease/amylase (CREON ) 36000 UNITS CPEP capsule Take 2 capsules (72,000 Units total) by mouth 3 (three) times daily before meals. 11/04/23   Legrand Victory LITTIE DOUGLAS, MD  omeprazole  (PRILOSEC) 40 MG capsule Take 1 capsule  by mouth once daily 07/02/24   Alphonsa Glendia LABOR, MD  ondansetron  (ZOFRAN ) 4 MG tablet Take 1 tablet (4 mg total) by mouth every 8 (eight) hours as needed. 05/14/24   Alphonsa Glendia LABOR, MD  spironolactone  (ALDACTONE ) 50 MG tablet Take 1 tablet by mouth once daily 05/09/24   Legrand Victory LITTIE DOUGLAS, MD  traZODone  (DESYREL ) 50 MG tablet Take 0.5-1 tablets (25-50 mg total) by mouth at bedtime as needed for sleep. 05/14/24   Alphonsa Glendia LABOR, MD    Allergies: Vicoprofen [hydrocodone -ibuprofen ], Doxycycline , and Morphine  and codeine    Review of Systems  Gastrointestinal:  Positive for abdominal pain.    Updated Vital Signs BP (!) 156/82   Pulse 80   Temp 99.6 F (37.6 C) (Oral)   Resp 16   Ht 5' 3 (1.6 m)   Wt 69.4 kg   SpO2 100%   BMI 27.10 kg/m   Physical Exam Vitals and nursing note reviewed.  Constitutional:      General: She is not in acute distress.    Appearance: She is well-developed.     Comments: No active vomiting  HENT:     Head: Normocephalic and atraumatic.  Eyes:     Conjunctiva/sclera: Conjunctivae normal.  Cardiovascular:     Rate and Rhythm: Normal rate and regular rhythm.     Heart sounds: No murmur heard. Pulmonary:     Effort: Pulmonary effort is normal. No respiratory distress.  Breath sounds: Normal breath sounds.  Abdominal:     Palpations: Abdomen is soft.     Tenderness: There is abdominal tenderness.     Comments: Epigastric and left upper quadrant tenderness to palpation without rebound or guarding  Musculoskeletal:        General: No swelling.     Cervical back: Neck supple.  Skin:    General: Skin is warm and dry.     Capillary Refill: Capillary refill takes less than 2 seconds.  Neurological:     Mental Status: She is alert.  Psychiatric:        Mood and Affect: Mood normal.     (all labs ordered are listed, but only abnormal results are displayed) Labs Reviewed  COMPREHENSIVE METABOLIC PANEL WITH GFR - Abnormal; Notable for the following  components:      Result Value   Sodium 134 (*)    Chloride 95 (*)    CO2 21 (*)    Glucose, Bld 106 (*)    Total Protein 9.5 (*)    Anion gap 18 (*)    All other components within normal limits  CBC - Abnormal; Notable for the following components:   RBC 5.32 (*)    Hemoglobin 10.8 (*)    MCV 69.4 (*)    MCH 20.3 (*)    MCHC 29.3 (*)    RDW 17.2 (*)    All other components within normal limits  URINALYSIS, ROUTINE W REFLEX MICROSCOPIC - Abnormal; Notable for the following components:   Ketones, ur 80 (*)    All other components within normal limits  POC URINE PREG, ED - Normal  LIPASE, BLOOD    EKG: None  Radiology: No results found.   Procedures   Medications Ordered in the ED  oxyCODONE  (Oxy IR/ROXICODONE ) immediate release tablet 5 mg (has no administration in time range)  lactated ringers  bolus 1,000 mL (1,000 mLs Intravenous New Bag/Given 07/26/24 1220)  ondansetron  (ZOFRAN ) injection 4 mg (4 mg Intravenous Given 07/26/24 1220)  fentaNYL  (SUBLIMAZE ) injection 50 mcg (50 mcg Intravenous Given 07/26/24 1220)                                    Medical Decision Making Amount and/or Complexity of Data Reviewed Labs: ordered.  Risk Prescription drug management.     Differential diagnosis includes but is not limited to Acute cholecystitis, cholelithiasis, cholangitis, choledocholithiasis, peptic ulcer, gastritis, gastroenteritis, appendicitis, IBS, IBD, DKA, nephrolithiasis, UTI, pyelonephritis, pancreatitis, diverticulitis, mesenteric ischemia, abdominal aortic aneurysm, small bowel obstruction, volvulus, ovarian torsion and pregnancy related concerns in females of childbearing age    ED Course:  Upon initial evaluation, patient is well-appearing, no acute distress.  Stable vitals aside from elevated blood pressure 156/82.  She has mild tenderness to the epigastric and left upper quadrant without rebound or guarding.  No active vomiting.  Labs Ordered: I Ordered,  and personally interpreted labs.  The pertinent results include:   CBC without leukocytosis.  Mild anemia with hemoglobin 10.8 CMP with hyponatremia 134, low chloride at 95.  No other electrolyte abnormalities.  Normal creatinine and LFTs Lipase within normal limits Urinalysis with ketones present, but no nitrates and leukocytes Pregnancy negative   Medications Given: LR bolus Zofran  Fentanyl , oxycodone   Upon re-evaluation, patient reports pain somewhat improved with the medications given.  Does not feel as nauseous.  She was able to tolerate intake of water at bedside  without difficulty.  We discussed that her labs overall look reassuring, she does not have any leukocytosis, no elevation in LFTs or creatinine, normal lipase.  No signs of UTI on urinalysis.  She states that this pain is exactly the same as her pancreatitis flareup, feels no different.  I had a shared decision-making conversation with the patient regarding obtaining CT abdomen pelvis imaging for further evaluation to confirm pancreatitis and to make sure there are no complications associated with her pancreatitis flareup today.  She states that this pain is exactly similar to previous flares and does not want to obtain CT abdomen and pelvis imaging at this time.  Given pain well-controlled, stable vitals, no signs of systemic infection, feel we can hold off on CT at this time.  Will go ahead and treat for likely pancreatitis given her history of pancreatitis reports that this feels the same.  Had shared decision-making conversation regarding admission versus outpatient management.  Her pain has been controlled, she is tolerating p.o. intake, she feels that she can manage this at home.  I also feel she is stable appropriate for discharge home at this time.    Impression: Epigastric abdominal pain  Disposition:  The patient was discharged home with instructions to maintain a liquid diet for the next day, then advance diet as  tolerated.  Tylenol  as needed for pain at home.  Zofran  as needed for nausea. Oxycodone  as needed for breakthrough pain. She understands this may make  her drowsy and to not drink alcohol or drive after taking this medication. Follow up with her GI doctor within the next 2 weeks for further evaluation. Return precautions given.    Record Review: External records from outside source obtained and reviewed including CT abdomen pelvis from 11/24 which revealed pancreatitis and pancreatic pseudocyst     This chart was dictated using voice recognition software, Dragon. Despite the best efforts of this provider to proofread and correct errors, errors may still occur which can change documentation meaning.       Final diagnoses:  Pain of upper abdomen    ED Discharge Orders          Ordered    oxyCODONE  (ROXICODONE ) 5 MG immediate release tablet  Every 6 hours PRN        07/26/24 1437    ondansetron  (ZOFRAN -ODT) 4 MG disintegrating tablet  Every 8 hours PRN        07/26/24 1437               Veta Palma, PA-C 07/26/24 1448    Suzette Pac, MD 07/28/24 1328

## 2024-08-02 ENCOUNTER — Telehealth: Payer: Self-pay
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
## 2024-08-04 ENCOUNTER — Other Ambulatory Visit: Payer: Self-pay

## 2024-08-04 ENCOUNTER — Telehealth: Payer: Self-pay | Admitting: Gastroenterology

## 2024-08-04 DIAGNOSIS — Z8719 Personal history of other diseases of the digestive system: Secondary | ICD-10-CM

## 2024-08-04 DIAGNOSIS — K219 Gastro-esophageal reflux disease without esophagitis: Secondary | ICD-10-CM

## 2024-08-04 MED ORDER — ZENPEP 40000-126000 UNITS PO CPEP: 1.0000 | ORAL_CAPSULE | Freq: Three times a day (TID) | ORAL | 0 refills | Status: AC

## 2024-08-04 NOTE — Telephone Encounter (Signed)
 Pt informed of providers recommendation about following a low-fat diet. Referral placed and pt states understanding.

## 2024-08-04 NOTE — Telephone Encounter (Signed)
 Patient called this morning requesting two things.  1) She needs the number for [employer] for their patient assistance program for Creon . 2) She would like to get samples of either Creon  or Zenpep  if Creon  is not available.  Please call patient with the information she's requesting.  Thank you.

## 2024-08-04 NOTE — Telephone Encounter (Signed)
 Creon  36,000 unit capsule We typically advise 2 capsules 3 times daily with meals  However, given the limited supply of samples, take 1 capsule 3 times daily with meals  If we have a greater supply of Zenpep  40,000 unit capsules, then give those instead and take 1 capsule 3 times daily  VEAR Brand MD

## 2024-08-04 NOTE — Telephone Encounter (Signed)
 Sure thing. What she mainly needs is a low-fat diet because of her history of pancreatitis. If she wishes to see a Actuary, referral can be sent.  HD

## 2024-08-04 NOTE — Telephone Encounter (Signed)
 Okay for Samples? Please advise. Thank you!

## 2024-08-04 NOTE — Telephone Encounter (Signed)
 [employer] pt assistance phone # given to pt to call about Creon . Zenpep  samples placed at the 3rd floor front desk for pt to pick up in the morning.   She asked if she could get a referral to a dietician. Please advise.

## 2024-08-05 NOTE — Telephone Encounter (Signed)
 Referral updated to reflect GERD Z code and pancreatitis in the comments.

## 2024-08-05 NOTE — Telephone Encounter (Signed)
 She can drop off the form-I will review it.  If I need additional information from her I can call her.  Please notify patient

## 2024-08-05 NOTE — Addendum Note (Signed)
 Addended by: Paralee Pendergrass B on: 08/05/2024 09:41 AM   Modules accepted: Orders

## 2024-08-05 NOTE — Addendum Note (Signed)
 Addended by: Fareedah Mahler B on: 08/05/2024 09:29 AM   Modules accepted: Orders

## 2024-08-05 NOTE — Telephone Encounter (Signed)
 Inbound call from [facility] Dietitian called stating that they had received a referral for patient. She states that they are unable to do Z codes on referrals and asked that the referral be changed to Reflux and on the comments can place pancreatitis. Please advise.

## 2024-08-13 ENCOUNTER — Telehealth: Payer: Self-pay

## 2024-08-13 NOTE — Telephone Encounter (Signed)
 Patient dropped off document DMV formDMV, to be filled out by provider. Patient requested to send it back via Call Patient to pick up within 7-days. Document is located in providers tray at front office.Please advise at Mobile [phone] (mobile)  Placed in red form

## 2024-08-18 ENCOUNTER — Telehealth: Payer: Self-pay | Admitting: Family Medicine

## 2024-08-18 NOTE — Telephone Encounter (Signed)
 Front staff I tried calling the patient and she did not answer this was on Wednesday In order to safely do this form I would need to do either a virtual visit or a in person visit either with myself or with Elveria this week or Tuesday or Wednesday of next week same-day slot would be fine even though this may just be a form to fill out it does have medical legal ramifications so it is very important that we are able to answer each question accurately and that requires of fluid visit with the patient to be able to address each question  (As in what medical conditions the patient has that has triggered this paper for her and ascertain her current medical situation)  I have referred turn this form to the front please connect with patient and set her up accordingly based on the above

## 2024-08-25 ENCOUNTER — Encounter: Payer: Self-pay | Admitting: Family Medicine

## 2024-08-25 ENCOUNTER — Ambulatory Visit: Payer: Self-pay | Admitting: Family Medicine

## 2024-08-25 VITALS — BP 132/85 | HR 103 | Temp 98.2°F | Ht 63.0 in | Wt 147.0 lb

## 2024-08-25 DIAGNOSIS — G2581 Restless legs syndrome: Secondary | ICD-10-CM

## 2024-08-25 DIAGNOSIS — Z0279 Encounter for issue of other medical certificate: Secondary | ICD-10-CM

## 2024-08-25 DIAGNOSIS — D509 Iron deficiency anemia, unspecified: Secondary | ICD-10-CM

## 2024-08-25 MED ORDER — ONDANSETRON 4 MG PO TBDP: 4.0000 mg | ORAL_TABLET | Freq: Three times a day (TID) | ORAL | 0 refills | Status: AC | PRN
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
## 2024-09-01 ENCOUNTER — Telehealth: Payer: Self-pay | Admitting: Gastroenterology

## 2024-09-01 NOTE — Telephone Encounter (Signed)
 Inbound call from patient requesting a call back to discuss if she can get samples of Creon  or Zenpep . Please advise.

## 2024-09-02 NOTE — Telephone Encounter (Signed)
 Spoke with [employer] to find out where they are in the process of her Creon  assistance. They said there was household and income information they hadn't received yet.   -how many people in your home. -how many people under the age of 58. -how many adults in the household with an income  Since the application was sent over 6 months ago the patient will now need an updated application. [employer] emailed the pt a new application and will be faxing us  one as well.   Pt reports one bottle of Zenpep  left and would like to know if she is able to get more samples while she is waiting for the Creon  assistance. Please advise.

## 2024-09-02 NOTE — Telephone Encounter (Signed)
 Creon  36,000 unit capsule We typically advise 2 capsules 3 times daily with meals   However, given the limited supply of samples, take 1 capsule 3 times daily with meals  Can give 6 bottles   If we have a greater supply of Zenpep  40,000 unit capsules, then give those instead and take 1 capsule 3 times daily   VEAR Brand MD

## 2024-09-03 NOTE — Telephone Encounter (Signed)
 Zenpep  samples left at 3rd floor front desk for pt to pick up at her convenience. Pt informed via VM.

## 2024-09-08 ENCOUNTER — Telehealth: Payer: Self-pay

## 2024-09-08 NOTE — Telephone Encounter (Signed)
 LMTCB and let us  know if/when she has started Creon .

## 2024-10-12 ENCOUNTER — Telehealth: Payer: Self-pay | Admitting: Gastroenterology

## 2024-10-12 NOTE — Telephone Encounter (Signed)
 Inbound call from patient stating she contacted [employer] regarding Creon . States she is requesting an update regarding paperwork. States paperwork had a section to be filled out by our office. Requesting to know if paperwork has been received. Please advise, thank you

## 2024-10-13 NOTE — Telephone Encounter (Signed)
 Pt states she just sent in her portion of the paperwork. Advised we sent our portion a while back. She reports she is on her last bottle of samples and is requesting more if possible. Please advise.

## 2024-10-13 NOTE — Telephone Encounter (Signed)
 Patient requesting a call regarding [employer] paperwork. Also requesting a call to discuss samples of medication. Please advise, thank you

## 2024-10-13 NOTE — Telephone Encounter (Signed)
 You can check to see if we have any samples as I am currently out of the office on hospital consult service this week.  Thank you  H Danis

## 2024-10-14 NOTE — Telephone Encounter (Signed)
 PT returning call to find out if the samples requested are available. Please advise.

## 2024-10-14 NOTE — Telephone Encounter (Signed)
 Creon  samples (6 boxes) placed at front desk, 3rd floor, for pt to pick up in the morning.    Patient is to take 1 capsule 3 times daily with meals. Pt informed and states understanding.

## 2024-10-28 ENCOUNTER — Other Ambulatory Visit: Payer: Self-pay | Admitting: Family Medicine

## 2024-10-31 ENCOUNTER — Other Ambulatory Visit: Payer: Self-pay | Admitting: Family Medicine

## 2024-11-02 ENCOUNTER — Other Ambulatory Visit: Payer: Self-pay | Admitting: Family Medicine

## 2024-11-08 ENCOUNTER — Telehealth: Payer: Self-pay | Admitting: Gastroenterology

## 2024-11-08 NOTE — Telephone Encounter (Signed)
 Inbound call from patient stating that she is needing a refill on her creon  and if her pharmacy does not have it in stock that she typical will get zenpep . Patient is requesting to be notified of the refill being sent over. Please advise.

## 2024-11-09 NOTE — Telephone Encounter (Signed)
 Patient calling in regards to previous note. Please advise.   Thank you

## 2024-11-10 NOTE — Telephone Encounter (Signed)
 Zenpep  40,000 (6 bottles) samples placed upfront for pt to pick up on 3rd floor.

## 2024-11-17 ENCOUNTER — Ambulatory Visit: Payer: Self-pay | Admitting: Family Medicine

## 2024-11-29 ENCOUNTER — Other Ambulatory Visit: Payer: Self-pay | Admitting: Family Medicine

## 2024-11-30 ENCOUNTER — Other Ambulatory Visit: Payer: Self-pay | Admitting: Gastroenterology

## 2024-12-07 ENCOUNTER — Telehealth: Payer: Self-pay | Admitting: Gastroenterology

## 2024-12-07 NOTE — Telephone Encounter (Signed)
 Brittany [name] informed that 3 bottles of both zenpep  and creon  put up front for pick up.

## 2024-12-07 NOTE — Telephone Encounter (Signed)
 Inbound call from patient requesting to have more samples of Creon  and zenpep . Patient is requesting a call back and stated if we are able to return her call back today she would greatly appreciate it. Please advise.

## 2024-12-28 ENCOUNTER — Other Ambulatory Visit: Payer: Self-pay | Admitting: Gastroenterology

## 2024-12-29 ENCOUNTER — Other Ambulatory Visit: Payer: Self-pay | Admitting: Family Medicine

## 2024-12-29 NOTE — Telephone Encounter (Signed)
 Hannahmarie informed that 3 bottles of creon  and 4 bottles of zenpep  put up front for pick up.

## 2024-12-29 NOTE — Telephone Encounter (Signed)
 PT is calling to find out if she can have more samples of the Zenpep  and Creon . Please advise.
